# Patient Record
Sex: Female | Born: 1935
Health system: Southern US, Community
[De-identification: ages and names within clinical notes are randomized; demographics above are authoritative.]

## PROBLEM LIST (undated history)

## (undated) DIAGNOSIS — C4402 Squamous cell carcinoma of skin of lip: Secondary | ICD-10-CM

## (undated) DIAGNOSIS — M1990 Unspecified osteoarthritis, unspecified site: Secondary | ICD-10-CM

## (undated) DIAGNOSIS — J189 Pneumonia, unspecified organism: Secondary | ICD-10-CM

## (undated) DIAGNOSIS — E785 Hyperlipidemia, unspecified: Secondary | ICD-10-CM

## (undated) DIAGNOSIS — D649 Anemia, unspecified: Secondary | ICD-10-CM

## (undated) DIAGNOSIS — Z9289 Personal history of other medical treatment: Secondary | ICD-10-CM

## (undated) DIAGNOSIS — G459 Transient cerebral ischemic attack, unspecified: Secondary | ICD-10-CM

## (undated) DIAGNOSIS — R112 Nausea with vomiting, unspecified: Secondary | ICD-10-CM

## (undated) DIAGNOSIS — Z9889 Other specified postprocedural states: Secondary | ICD-10-CM

## (undated) DIAGNOSIS — B370 Candidal stomatitis: Secondary | ICD-10-CM

## (undated) DIAGNOSIS — I639 Cerebral infarction, unspecified: Secondary | ICD-10-CM

## (undated) DIAGNOSIS — I10 Essential (primary) hypertension: Secondary | ICD-10-CM

## (undated) HISTORY — PX: BREAST CYST EXCISION: SHX579

## (undated) HISTORY — PX: LAPAROSCOPIC CHOLECYSTECTOMY: SUR755

## (undated) HISTORY — PX: JOINT REPLACEMENT: SHX530

## (undated) HISTORY — PX: MOHS SURGERY: SUR867

## (undated) HISTORY — PX: EYE SURGERY: SHX253

## (undated) HISTORY — PX: TUBAL LIGATION: SHX77

---

## 1968-09-26 HISTORY — PX: APPENDECTOMY: SHX54

## 1982-09-26 HISTORY — PX: VAGINAL HYSTERECTOMY: SUR661

## 1998-10-01 ENCOUNTER — Other Ambulatory Visit: Admission: RE | Admit: 1998-10-01 | Discharge: 1998-10-01 | Payer: Self-pay | Admitting: Obstetrics and Gynecology

## 1999-10-11 ENCOUNTER — Other Ambulatory Visit: Admission: RE | Admit: 1999-10-11 | Discharge: 1999-10-11 | Payer: Self-pay | Admitting: Obstetrics and Gynecology

## 2000-11-17 ENCOUNTER — Other Ambulatory Visit: Admission: RE | Admit: 2000-11-17 | Discharge: 2000-11-17 | Payer: Self-pay | Admitting: Obstetrics and Gynecology

## 2001-11-30 ENCOUNTER — Other Ambulatory Visit: Admission: RE | Admit: 2001-11-30 | Discharge: 2001-11-30 | Payer: Self-pay | Admitting: Obstetrics and Gynecology

## 2002-12-27 ENCOUNTER — Other Ambulatory Visit: Admission: RE | Admit: 2002-12-27 | Discharge: 2002-12-27 | Payer: Self-pay | Admitting: Obstetrics and Gynecology

## 2003-09-27 HISTORY — PX: CATARACT EXTRACTION W/ INTRAOCULAR LENS  IMPLANT, BILATERAL: SHX1307

## 2003-12-29 ENCOUNTER — Other Ambulatory Visit: Admission: RE | Admit: 2003-12-29 | Discharge: 2003-12-29 | Payer: Self-pay | Admitting: Obstetrics and Gynecology

## 2004-09-26 DIAGNOSIS — Z9289 Personal history of other medical treatment: Secondary | ICD-10-CM

## 2004-09-26 HISTORY — PX: TOTAL KNEE ARTHROPLASTY: SHX125

## 2004-09-26 HISTORY — DX: Personal history of other medical treatment: Z92.89

## 2004-09-29 ENCOUNTER — Inpatient Hospital Stay (HOSPITAL_COMMUNITY): Admission: RE | Admit: 2004-09-29 | Discharge: 2004-10-05 | Payer: Self-pay | Admitting: Orthopedic Surgery

## 2004-09-29 ENCOUNTER — Ambulatory Visit: Payer: Self-pay | Admitting: Physical Medicine & Rehabilitation

## 2007-05-04 ENCOUNTER — Inpatient Hospital Stay (HOSPITAL_COMMUNITY): Admission: EM | Admit: 2007-05-04 | Discharge: 2007-05-05 | Payer: Self-pay | Admitting: Emergency Medicine

## 2007-05-04 ENCOUNTER — Encounter (INDEPENDENT_AMBULATORY_CARE_PROVIDER_SITE_OTHER): Payer: Self-pay | Admitting: *Deleted

## 2007-05-07 ENCOUNTER — Ambulatory Visit (HOSPITAL_COMMUNITY): Admission: RE | Admit: 2007-05-07 | Discharge: 2007-05-07 | Payer: Self-pay | Admitting: *Deleted

## 2009-09-26 HISTORY — PX: RETINAL DETACHMENT SURGERY: SHX105

## 2010-09-16 ENCOUNTER — Observation Stay (HOSPITAL_COMMUNITY)
Admission: RE | Admit: 2010-09-16 | Discharge: 2010-09-17 | Payer: Self-pay | Source: Home / Self Care | Attending: Ophthalmology | Admitting: Ophthalmology

## 2010-12-06 LAB — BASIC METABOLIC PANEL
BUN: 12 mg/dL (ref 6–23)
CO2: 30 mEq/L (ref 19–32)
Calcium: 9.9 mg/dL (ref 8.4–10.5)
Chloride: 97 mEq/L (ref 96–112)
Creatinine, Ser: 0.93 mg/dL (ref 0.4–1.2)
GFR calc Af Amer: >60 mL/min (ref >60)
GFR calc non Af Amer: 58 mL/min — ABNORMAL LOW (ref >60)
Glucose, Bld: 118 mg/dL — ABNORMAL HIGH (ref 70–99)
Potassium: 3.4 mEq/L — ABNORMAL LOW (ref 3.5–5.1)
Sodium: 138 mEq/L (ref 135–145)

## 2010-12-06 LAB — CBC
HCT: 39.3 % (ref 36.0–46.0)
Hemoglobin: 13.5 g/dL (ref 12.0–15.0)
MCH: 31.8 pg (ref 26.0–34.0)
MCHC: 34.4 g/dL (ref 30.0–36.0)
MCV: 92.7 fL (ref 78.0–100.0)
Platelets: 361 10*3/uL (ref 150–400)
RBC: 4.24 MIL/uL (ref 3.87–5.11)
RDW: 13.2 % (ref 11.5–15.5)
WBC: 8.4 10*3/uL (ref 4.0–10.5)

## 2010-12-06 LAB — SURGICAL PCR SCREEN
MRSA, PCR: NEGATIVE
Staphylococcus aureus: POSITIVE — AB

## 2011-02-08 NOTE — H&P (Signed)
[name], [name]            ACCOUNT NO.:  [id number]   MEDICAL RECORD NO.:  [id number]          PATIENT TYPE:  EMS   LOCATION:  MAJO                         FACILITY:  [facility]   PHYSICIAN:  Bevelyn Buckles. Champey, M.D.DATE OF BIRTH:  13-Oct-1930   DATE OF ADMISSION:  05/04/2007  DATE OF DISCHARGE:                              HISTORY & PHYSICAL   STROKE ADMIT NOTE:   REQUESTING PHYSICIAN:  Dr. Jennette Kettle.   REASON FOR ADMISSION:  Code stroke.   HISTORY OF PRESENT ILLNESS:  Ms. [name] is a 77 year old Caucasian  female with a past medical history of hypertension who presents with  onset, around 8:30 this morning, of intense vertigo and right upper  extremity tingling.  This was followed by right upper extremity being  weak.  Symptoms have resolved or have greatly improved; however, patient  has only subtle right hand tingling currently.  She denies any  headaches, vision changes, speech changes, swallowing problems, chewing  problems, falls or loss of consciousness.  Patient does state that she  felt extremely unsteady on her feet when she had significant vertigo.   PAST MEDICAL HISTORY:  Positive for:  1. Right total knee replacement.  2. Hypertension.   CURRENT MEDICATIONS:  Include hydrochlorothiazide and Premarin.   ALLERGIES:  NO KNOWN DRUG ALLERGIES.   FAMILY HISTORY:  Positive for diabetes.   SOCIAL HISTORY:  The patient lives alone.  Denies any alcohol, smoking  or drug use.   REVIEW OF SYSTEMS:  Positive as per HPI.  Negative as per HPI in greater  than 8 other organ systems.   PHYSICAL EXAMINATION:  VITAL SIGNS:  Temperature is 97.0.  Blood  pressure is 164/78.  Heart rate is 68.  Respirations are 29.  O2  saturation is 100%.  HEENT:  Normocephalic, atraumatic.  Extraocular muscles are intact.  Pupils equal, round and reactive to light.  NECK:  Supple.  No carotid bruits.  HEART:  Regular.  LUNGS:  Clear.  ABDOMEN:  Soft, nontender.  EXTREMITIES:  Show good  pulses.  NEUROLOGICAL EXAMINATION:  Patient is awake, alert and oriented x3.  Language is fluent.  Cranial nerves II-XII are grossly intact.  Motor  examination:  Patient has good strength in all 4 extremities.  The  patient might have subtle right grip strength weakness at 4-4+/5  strength.  Sensory examination:  Within normal limits to light touch and  pinprick.  Reflexes are 1-2+ throughout and symmetric.  Toes are neutral  bilaterally.  Cerebellar function is within normal limits finger-to-nose  and heel-to-shin.  Gait was not assessed secondary to safety.  NIAH  Stroke Scale was 0.   CT of head show no acute bleed or infarct.   LABORATORY DATA:  Currently pending.   IMPRESSION:  This is a 77 year old Caucasian female with transient right-  sided tingling, weakness and vertigo, which all symptoms have resolved  or seem to improve and NIH Stroke Scale was 0.  Patient is not a  candidate for IV TPA given that her symptoms have resolved and symptoms  were greater than 3 hour onset.  I will admit the patient  to stroke MD  service and diagnosis of transient ischemic attack versus a small  infarct.  We will start on an aspirin a day and hold her Premarin.  We  will check an MRI/MRA of the brain, a 2D echocardiogram with carotid  Dopplers.  We will check fasting and homocystine level and lipids.  We  will put the patient on deep venous thrombosis prophylaxis.  We will  follow the patient while she is in the hospital.      Bevelyn Buckles. Nash Shearer, M.D.  Electronically Signed     DRC/MEDQ  D:  05/04/2007  T:  05/05/2007  Job:  [id number]

## 2011-02-08 NOTE — Discharge Summary (Signed)
[name], [name]            ACCOUNT NO.:  [id number]   MEDICAL RECORD NO.:  [id number]          PATIENT TYPE:  INP   LOCATION:  3031                         FACILITY:  [facility]   PHYSICIAN:  Bevelyn Buckles. Champey, M.D.DATE OF BIRTH:  [birth year]   DATE OF ADMISSION:  05/04/2007  DATE OF DISCHARGE:  05/05/2007                               DISCHARGE SUMMARY   REASON FOR ADMISSION:  TIA.   SECONDARY DIAGNOSES:  1. Hypertension.  2. Right total knee replacement.   DISCHARGE MEDICATIONS:  1. Aspirin 81 mg p.o. daily.  2. Hydrochlorothiazide 25 mg p.o. daily.  3. Zocor 20 mg p.o. daily.   HOSPITAL COURSE:  Please see admission H&P for complete details of  admission. The patient was admitted with transient right-sided tingling,  numbness and vertigo which completely resolved.  Her initial  stroke  scale was zero. The patient had an MRI scan of the brain that did not  have any evidence of acute stroke, showed some mild small vessel disease  and mild arthrosclerosis. Carotid duplex bilaterally showed no ICA  stenosis.  The patient's cholesterol level was elevated at 243, and LDL  was 127. Hemoglobin A1c was 5.8.  Homocysteine level was pending.  Urinalysis showed 3-6 wbc's and positive nitrites. The patient's  symptoms completely resolved.  She had uneventful hospital stay. She was  started on Zocor day of admission for elevated cholesterol.  We held her  Premarin secondary to her TIA.  We arranged a 2-D echocardiogram as an  outpatient on Monday morning. The patient was discharged home in stable  condition.  She is told to follow up with Dr. Nash Shearer in two week's  time. We will also place the patient on Levaquin 500 mg per day for 5  days for a mild UTI.  The patient is told to call with any questions or  concerns.      Bevelyn Buckles. Nash Shearer, M.D.  Electronically Signed     DRC/MEDQ  D:  05/05/2007  T:  05/06/2007  Job:  [id number]

## 2011-02-11 NOTE — Op Note (Signed)
NAMERUSSELL, ENGELSTAD            ACCOUNT NO.:  [id number]   MEDICAL RECORD NO.:  [id number]          PATIENT TYPE:  INP   LOCATION:  X003                         FACILITY:  [facility]   PHYSICIAN:  Ollen Gross, M.D.    DATE OF BIRTH:  [birth year]   DATE OF PROCEDURE:  09/29/2004  DATE OF DISCHARGE:                                 OPERATIVE REPORT   PREOPERATIVE DIAGNOSIS:  Osteoarthritis of right knee with valgus deformity.   POSTOPERATIVE DIAGNOSIS:  Osteoarthritis of right knee with valgus  deformity.   OPERATION PERFORMED:  Right total knee arthroplasty.   SURGEON:  Ollen Gross, M.D.   ASSISTANT:  Alexzandrew L. Julien Girt, P.A.   ANESTHESIA:  General with postoperative Marcaine pain pump.   ESTIMATED BLOOD LOSS:  Minimal.   DRAINS:  Hemovac times one.   TOURNIQUET TIME:  37 minutes at 300 mmHg.   COMPLICATIONS:  None.   CONDITION:  Stable to recovery room.   INDICATIONS FOR PROCEDURE:  Ms. Yazdi is a 77 year old female with  severe end stage arthritis of the right knee with significant valgus  deformity.  She has failed nonoperative management and presents now for  right total knee arthroplasty.   DESCRIPTION OF PROCEDURE:  After successful administration of general  anesthetic, a tourniquet was placed high on the right thigh and right lower  extremity prepped and draped in the usual sterile fashion.  Extremity was  wrapped with an Esmarch bandage, knee flexed and tourniquet inflated to 300  mmHg.  A standard midline incision was made with a 10 blade through  subcutaneous tissue to the level of the extensor mechanism.  Given her  severe valgus deformity, we did a lateral parapatellar arthrotomy.  The  arthrotomy was made and the soft tissue over the proximal lateral tibia was  subperiosteally elevated to the joint line with a knife.  We then everted  the patella medially.  The ACL and PCL were removed.  The knee was flexed 90  degrees and the drill was used to  create a starting hole in the distal  femur.  The canal was irrigated and the 5 degree right valgus and alignment  guide was placed.  Referencing the posterior condyles rotation is marked and  the block pinned to remove 10 mm off the distal femur.  Distal femoral  resection was made with an oscillating saw.  Sizing block was placed and  size 3 was most appropriate.  We did our rotation off the epicondylar axis.  Size 3 cutting block was placed and then anterior,posterior and chamfer cuts  were made for the size 3.   Tibia was subluxed forward and the menisci removed.  Extramedullary tibial  alignment guide was placed referencing proximally at the medial aspect of  the tibial tubercle and distally along the second metatarsal axis and the  tibial crest.  Blocks, pins removed approximately 6 mm off the less  deficient medial side.  The tibial resection was made with an oscillating  saw.  Size 2.5 was most appropriate.  The proximal tibia was then prepared  with a modular drill and keel punch for  size 2.5.  Femoral preparation was  completed with intercondylar cut.   The trial 3, size 3 posterior stabilized femur, size 2.5 mobile bearing  tibial tray and a 10 mm trial posterior stabilized rotating platform insert  trial was placed.  Full extension achieved with excellent varus and valgus  balance throughout full range of motion.  Her valgus deformity was corrected  back to neutral.  The patella then everted medially and thickness measured  to be 22 mm.  Free hand resection was taken to 13 mm, a 35 template was  placed, lug holes were drilled, trial patella was placed, and it tracked  normally.  Osteophytes are removed off the posterior femur with the femoral  in place.  All trials were removed and the cut bone surfaces were prepared  with pulsatile lavage.  Cement was mixed and once ready for implantation,  the size 2.5 mobile bearing tibial tray, size 3 posterior stabilized femur  and 35  patella were cemented into place.  Patella was held with a clamp.  Trial 10 mm inserts placed.  Knee held in full extension.  All extruded  cement removed.  Once the cement was fully hardened then the permanent 10 mm  posterior stabilized rotating platform is placed in the tibial tray.  The  wound was then copiously irrigated with saline solution and the tourniquet  released with a total time of 37 minutes.  Minor bleeding stopped with  cautery.  We then closed the arthrotomy laterally with #1 PDS leaving open  the area from the superior to the inferior pole of the patella to serve as a  minilateral release.  Patella tracked normally with flexion against gravity  of 140 degrees.  Subcu was then closed with interrupted 2-0 Vicryl and  subcuticular running 4-0 Monocryl.  The incision was cleaned and dried and  Steri-Strips were applied.  We hooked the drain to suction and placed the  catheter for the Marcaine pain pump and initiated the pump.  A bulky sterile  dressing was placed about her knee. We then had urology come in to place the  Foley catheter as she had unusual anatomy.  Once that was placed, she was  then awakened and transported to recovery in stable condition.     Drenda Freeze   FA/MEDQ  D:  09/29/2004  T:  09/29/2004  Job:  [id number]

## 2011-02-11 NOTE — H&P (Signed)
[name], [name]            ACCOUNT NO.:  [id number]   MEDICAL RECORD NO.:  [id number]          PATIENT TYPE:  INP   LOCATION:  NA                           FACILITY:  [facility]   PHYSICIAN:  Ollen Gross, M.D.    DATE OF BIRTH:  [birth year]   DATE OF ADMISSION:  09/29/2004  DATE OF DISCHARGE:                                HISTORY & PHYSICAL   DATE OF OFFICE VISIT HISTORY AND PHYSICAL:  September 23, 2004   CHIEF COMPLAINT:  Right knee pain.   HISTORY OF PRESENT ILLNESS:  The patient is a 77 year old female with a  several year history of progressive worsening right knee pain.  No specific  injury leading up to the knee pain; however, it is becoming functionally  limiting.  She is a very avid sports fan, especially [employer] football.  She has had a difficult time getting to the games and going up and down the  steps.  She has tried medications in the past but continues to have  progressive pain.  She is seen in the office by Dr. Lequita Halt, and x-rays show  bone-on-bone changes in the lateral patellofemoral compartments, end-stage  valgus arthritis.  She has a greater than 20-degree deformity noted.  It is  felt she does have significant changes and felt to be a good candidate for  knee replacement.  Risks and benefits of the procedure of the knee  replacement have been discussed, and she elects to proceed with surgery.   ALLERGIES:  No known drug allergies.   CURRENT MEDICATIONS:  1.  Premarin 0.625 mg.  2.  Hydrochlorothiazide 25 mg.  3.  Glucosamine sulfate 2000 mg daily.   PAST MEDICAL HISTORY:  Hypertension.   PAST SURGICAL HISTORY:  1.  Hysterectomy.  2.  Gallbladder surgery.   SOCIAL HISTORY:  Widowed.  Retired.  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  Nonsmoker, no alcohol, has three children.  She does not have any assist  with her care after surgery.   FAMILY HISTORY:  Mother with a history of MI.  Father with a history of  liver cancer.  There were eight  children.  She has seven other siblings,  five of whom are still living of her siblings.  She does have two brothers,  both with lung cancer.  She had another brother with arthritis.  She has two  sisters who are very healthy.  She has another brother who is deceased  secondary to heart disease.   REVIEW OF SYSTEMS:  GENERAL:  No fevers, chills, or night sweats.  NEUROLOGIC:  No seizures, syncope, paralysis.  RESPIRATORY:  No shortness of  breath, productive cough, or hemoptysis.  CARDIOVASCULAR:  No chest pain,  angina, or orthopnea.  GI:  No nausea, vomiting, diarrhea, or constipation.  GU:  No dysuria, hematuria, or discharge.  MUSCULOSKELETAL:  Right knee  found in the history of present illness.   PHYSICAL EXAMINATION:  VITAL SIGNS:  Pulse 108, respirations 12, blood  pressure 158/84.  GENERAL:  A 77 year old white female, short, petite frame.  No acute  distress.  She is  alert, oriented, cooperative, and very pleasant at time of  exam.  HEENT:  Normocephalic, atraumatic.  Pupils are round and reactive.  She is  noted to wear glasses.  EOMs are intact.  Oropharynx is clear.  She does  have full upper and lower dentures noted.  CHEST:  Clear anterior and posterior chest walls.  No rhonchi, rales, or  wheezing.  HEART:  Regular rhythm, somewhat tachy 104-108.  Otherwise weakness.  No  rubs, thrills, palpitations.  ABDOMEN:  Soft, nontender.  Bowel sounds present.  GU/RECTAL/BREASTS/GENITALIA:  Not done.  Not pertinent to present illness.  EXTREMITIES:  Significant to the right knee.  She does have a significant  valgus malalignment with about a 20-degree deformity.  Range of motion of 0-  120 degrees.  She has marked crepitus noted on passive range of motion.  No  instability.  No effusion.   IMPRESSION:  1.  Osteoarthritis, right knee.  2.  Hypertension.   PLAN:  The patient will be admitted to [facility] to undergo a  right total knee arthroplasty.  Surgery will be  performed by Dr. Ollen Gross.     Alex   ALP/MEDQ  D:  09/28/2004  T:  09/28/2004  Job:  [id number]   cc:   C. Duane Lope, M.D.  [address]  [place]  [place] [address]  Fax: [phone]

## 2011-02-11 NOTE — Discharge Summary (Signed)
[name], Adams            ACCOUNT NO.:  [id number]   MEDICAL RECORD NO.:  [id number]          PATIENT TYPE:  INP   LOCATION:  0483                         FACILITY:  [facility]   PHYSICIAN:  Ollen Gross, M.D.    DATE OF BIRTH:  [birth year]   DATE OF ADMISSION:  09/29/2004  DATE OF DISCHARGE:  10/05/2004                                 DISCHARGE SUMMARY   ADMISSION DIAGNOSES:  1.  Osteoarthritis, right knee.  2.  Hypertension.   DISCHARGE DIAGNOSES:  1.  Osteoarthritis, right knee, with valgus deformity, status post right      total knee arthroplasty.  2.  Hypertension.  3.  Postoperative blood loss anemia.  4.  Status post transfusion without sequelae.  5.  Postoperative hyponatremia, improved.  6.  Postoperative hypokalemia, improved.   PROCEDURE:  On September 29, 2004, right total knee arthroplasty.   SURGEON:  Ollen Gross, M.D.   ASSISTANT:  Alexzandrew L. Perkins, P.A.-C.   ANESTHESIA:  General with postoperative Marcaine pain pump.   DRAINS:  Hemovac x1.   TOURNIQUET TIME:  Thirty-seven minutes at 300 mmHg.   CONSULTS:  GU secondary to severe labial adhesions and difficult Foley  insertion.   BRIEF HISTORY:  [name] is a 77 year old female with severe end-stage  arthritis of the right knee with severe, significant valgus deformity.  She  has failed nonoperative managements and now presents for total knee  arthroplasty.   LABORATORY DATA:  Preop hemoglobin 12.7, hematocrit 37.8.  Differential on  the admission CBC within normal limits.  Postop hemoglobin 9.3, drifted down  to 7.8.  She was given blood.  Post-transfusion hemoglobin and hematocrit 12  and 34.3.  PT/PTT preop 12 and 27, respectively.  Serial pro times followed:  Last noted PT/INR 20.2 and 2.1.  Chem panel on admission:  Slightly low  albumin of 3.4.  Admitting chem panel all within normal limits.  Serial  BMETs are followed.  Sodium did drop from 137 down to 123, back up to 135.  Potassium drifted from 4.1 to 2.6, back up to 4.  Chloride dropped from 98  down to 86, back up to 97.  Glucose went up from 89 to 135, back down to 96.  Preop UA:  Trace hemoglobin, moderate leukocyte esterase, 7-10 white cells,  0-2 red cells, many bacteria.  Blood group type A+.   EKG dated September 24, 2004:  Normal sinus rhythm with local QRS.  No  previous tracing.  Nonspecific T wave abnormalities, confirmed by Dr. Olga Millers.   HOSPITAL COURSE:  Patient was admitted to [facility] , taken to  the OR, and underwent the above procedure.  Had difficulty following  insertion, requiring genitourinary consult.  Found to have severe labial  adhesions obstructing vaginal opening.  Foley was finally inserted.  Estraderm cream was ordered for vaginal area for 2-4 weeks for decreasing  adhesions.  Later underwent the procedure.  Tolerated the procedure well.  Later was transferred to the recovery room and then to the orthopedic floor.  Patient was placed on PCA and p.o. analgesics.   On day #  1, she was actually doing very well, had good control with PCA and  p.o. meds.  She did have some positive fluid volume.  She was normally on  hydrochlorothiazide.  Monitored the I's and O's closely.  She did have a  little bit of nausea on the evening of postop day #1.  This was much better.   On day #2, her hemoglobin had dropped down to 7.8.  Was rechecked and  confirmed at 7.8.  Due to the significantly low hemoglobin postoperatively,  it was felt that she would best be served by undergoing blood.  She did  receive blood transfusion.  Tolerated blood well.  Hemoglobin responded  well.  Foley and PCA were discontinued on day #2.  A rehab consult was  called to assess for the need of possible inpatient rehab.  The patient was  seen by rehab services and they did recommend rehab or SACU stay.  She was  also noted to have a drop in her sodium and potassium, and this was felt to  be due  to dilutional component from the IV fluids and the surgery.  This did  improve with potassium supplementation and also normal diuresis of her  fluids.   By day #4, she was doing much better.  Pain was under good control.  She  started walking up and down the hallway much easier with physical therapy.  Her sodium and potassium low levels had resolved.   By October 04, 2004, she continued to progress well.  The incision was  healing well.  It was noted that she lived alone.  Still recommended that  she go to rehab; however, rehab coordinators, SACU coordinators, felt that  the patient was moderately independent.  She was walking with a cane, and  she was up to ambulating 200 feet.  Felt to be too high level for an  inpatient stay.  The patient was advised of this.  She did understand;  therefore, she remained in the hospital.  Continued with her physical  therapy.  Once she was doing quite well and progressing with her activities,  she was discharged home the following day of October 05, 2004.   DISCHARGE PLAN:  1.  Patient was discharged home on October 05, 2004.  2.  Discharge diagnoses:  Please see above.  3.  Discharge meds:  Percocet, Robaxin, Coumadin.  Resume      hydrochlorothiazide.  Hold her Premarin.  4.  Diet as tolerated.  5.  Activity:  Home health PT/OT and home health nursing.  Weightbearing as      tolerated.  Total knee protocol.  6.  Follow up in two weeks from surgery.  Contact the office for an      appointment.   DISPOSITION:  Home.   CONDITION ON DISCHARGE:  Improved.      ALP/MEDQ  D:  11/17/2004  T:  11/18/2004  Job:  [id number]   cc:   C. Duane Lope, M.D.  [address]  [place]  [place] [address]  Fax: [phone]

## 2011-03-19 ENCOUNTER — Other Ambulatory Visit (HOSPITAL_COMMUNITY): Payer: Self-pay

## 2011-03-19 ENCOUNTER — Emergency Department (HOSPITAL_COMMUNITY): Payer: Medicare Other

## 2011-03-19 ENCOUNTER — Inpatient Hospital Stay (HOSPITAL_COMMUNITY)
Admission: EM | Admit: 2011-03-19 | Discharge: 2011-03-22 | DRG: 690 | Disposition: A | Discharge status: Home / Self Care | Payer: Medicare Other | Attending: Internal Medicine | Admitting: Internal Medicine

## 2011-03-19 DIAGNOSIS — R55 Syncope and collapse: Secondary | ICD-10-CM | POA: Diagnosis present

## 2011-03-19 DIAGNOSIS — N390 Urinary tract infection, site not specified: Principal | ICD-10-CM | POA: Diagnosis present

## 2011-03-19 DIAGNOSIS — M4802 Spinal stenosis, cervical region: Secondary | ICD-10-CM | POA: Diagnosis present

## 2011-03-19 DIAGNOSIS — B952 Enterococcus as the cause of diseases classified elsewhere: Secondary | ICD-10-CM | POA: Diagnosis present

## 2011-03-19 DIAGNOSIS — M1990 Unspecified osteoarthritis, unspecified site: Secondary | ICD-10-CM | POA: Diagnosis present

## 2011-03-19 DIAGNOSIS — E119 Type 2 diabetes mellitus without complications: Secondary | ICD-10-CM | POA: Diagnosis present

## 2011-03-19 DIAGNOSIS — I6529 Occlusion and stenosis of unspecified carotid artery: Secondary | ICD-10-CM | POA: Diagnosis present

## 2011-03-19 DIAGNOSIS — I10 Essential (primary) hypertension: Secondary | ICD-10-CM | POA: Diagnosis present

## 2011-03-19 DIAGNOSIS — E876 Hypokalemia: Secondary | ICD-10-CM | POA: Diagnosis present

## 2011-03-19 DIAGNOSIS — Z7982 Long term (current) use of aspirin: Secondary | ICD-10-CM

## 2011-03-19 DIAGNOSIS — E785 Hyperlipidemia, unspecified: Secondary | ICD-10-CM | POA: Diagnosis present

## 2011-03-19 DIAGNOSIS — Z8673 Personal history of transient ischemic attack (TIA), and cerebral infarction without residual deficits: Secondary | ICD-10-CM

## 2011-03-19 LAB — BASIC METABOLIC PANEL
BUN: 18 mg/dL (ref 6–23)
CO2: 28 mEq/L (ref 19–32)
Calcium: 9.4 mg/dL (ref 8.4–10.5)
Creatinine, Ser: 0.87 mg/dL (ref 0.50–1.10)
GFR calc Af Amer: >60 mL/min (ref >60)
GFR calc non Af Amer: >60 mL/min (ref >60)
Glucose, Bld: 111 mg/dL — ABNORMAL HIGH (ref 70–99)
Potassium: 3.2 mEq/L — ABNORMAL LOW (ref 3.5–5.1)
Sodium: 136 mEq/L (ref 135–145)

## 2011-03-19 LAB — CBC
HCT: 34.4 % — ABNORMAL LOW (ref 36.0–46.0)
Hemoglobin: 11.9 g/dL — ABNORMAL LOW (ref 12.0–15.0)
MCH: 32 pg (ref 26.0–34.0)
MCHC: 34.6 g/dL (ref 30.0–36.0)
Platelets: 353 10*3/uL (ref 150–400)
RDW: 13.1 % (ref 11.5–15.5)
WBC: 8.2 10*3/uL (ref 4.0–10.5)

## 2011-03-19 LAB — DIFFERENTIAL
Basophils Absolute: 0.1 10*3/uL (ref 0.0–0.1)
Basophils Relative: 1 % (ref 0–1)
Eosinophils Absolute: 0.2 10*3/uL (ref 0.0–0.7)
Eosinophils Relative: 2 % (ref 0–5)
Lymphocytes Relative: 23 % (ref 12–46)
Monocytes Absolute: 0.6 10*3/uL (ref 0.1–1.0)
Monocytes Relative: 7 % (ref 3–12)
Neutrophils Relative %: 68 % (ref 43–77)

## 2011-03-19 LAB — URINALYSIS, ROUTINE W REFLEX MICROSCOPIC
Bilirubin Urine: NEGATIVE
Glucose, UA: NEGATIVE mg/dL
Hgb urine dipstick: NEGATIVE
Nitrite: NEGATIVE
Protein, ur: NEGATIVE mg/dL

## 2011-03-19 LAB — URINE MICROSCOPIC-ADD ON

## 2011-03-19 LAB — CK TOTAL AND CKMB (NOT AT ARMC)
CK, MB: 3.1 ng/mL (ref 0.3–4.0)
Relative Index: 1.9 (ref 0.0–2.5)
Total CK: 162 U/L (ref 7–177)

## 2011-03-19 LAB — PROTIME-INR
INR: 0.94 (ref 0.00–1.49)
Prothrombin Time: 12.8 seconds (ref 11.6–15.2)

## 2011-03-19 LAB — CARDIAC PANEL(CRET KIN+CKTOT+MB+TROPI): Relative Index: 1.9 (ref 0.0–2.5)

## 2011-03-19 LAB — APTT: aPTT: 30 seconds (ref 24–37)

## 2011-03-19 LAB — HEMOGLOBIN A1C: Mean Plasma Glucose: 128 mg/dL — ABNORMAL HIGH (ref <117)

## 2011-03-19 MED ORDER — IOHEXOL 300 MG/ML  SOLN: 80.0000 mL | Freq: Once | INTRAMUSCULAR | Status: AC | PRN

## 2011-03-20 ENCOUNTER — Inpatient Hospital Stay (HOSPITAL_COMMUNITY): Payer: Medicare Other

## 2011-03-20 DIAGNOSIS — R55 Syncope and collapse: Secondary | ICD-10-CM

## 2011-03-20 LAB — CBC
MCV: 91.4 fL (ref 78.0–100.0)
Platelets: 334 10*3/uL (ref 150–400)
RBC: 3.74 MIL/uL — ABNORMAL LOW (ref 3.87–5.11)
WBC: 8.9 10*3/uL (ref 4.0–10.5)

## 2011-03-20 LAB — TSH: TSH: 3.258 u[IU]/mL (ref 0.350–4.500)

## 2011-03-20 LAB — CARDIAC PANEL(CRET KIN+CKTOT+MB+TROPI)
CK, MB: 2.8 ng/mL (ref 0.3–4.0)
Relative Index: 2.2 (ref 0.0–2.5)
Troponin I: <0.3 ng/mL (ref <0.30)

## 2011-03-20 LAB — LIPID PANEL
HDL: 59 mg/dL (ref >39)
LDL Cholesterol: 109 mg/dL — ABNORMAL HIGH (ref 0–99)
Total CHOL/HDL Ratio: 3.4 RATIO
Triglycerides: 148 mg/dL (ref <150)
VLDL: 30 mg/dL (ref 0–40)

## 2011-03-20 LAB — COMPREHENSIVE METABOLIC PANEL
AST: 29 U/L (ref 0–37)
Albumin: 3.6 g/dL (ref 3.5–5.2)
Calcium: 9.3 mg/dL (ref 8.4–10.5)
Chloride: 99 mEq/L (ref 96–112)
Creatinine, Ser: 0.76 mg/dL (ref 0.50–1.10)
Total Bilirubin: 0.3 mg/dL (ref 0.3–1.2)
Total Protein: 6.8 g/dL (ref 6.0–8.3)

## 2011-03-20 LAB — VITAMIN B12: Vitamin B-12: 693 pg/mL (ref 211–911)

## 2011-03-21 LAB — TSH: TSH: 5.852 u[IU]/mL — ABNORMAL HIGH (ref 0.350–4.500)

## 2011-03-21 LAB — MAGNESIUM: Magnesium: 2 mg/dL (ref 1.5–2.5)

## 2011-03-21 LAB — CBC
MCH: 32.6 pg (ref 26.0–34.0)
MCHC: 35.9 g/dL (ref 30.0–36.0)
MCV: 90.7 fL (ref 78.0–100.0)
Platelets: 358 10*3/uL (ref 150–400)

## 2011-03-21 LAB — URINE CULTURE: Colony Count: >=100000

## 2011-03-21 LAB — BASIC METABOLIC PANEL
BUN: 12 mg/dL (ref 6–23)
Calcium: 9 mg/dL (ref 8.4–10.5)
Creatinine, Ser: 0.62 mg/dL (ref 0.50–1.10)
GFR calc Af Amer: >60 mL/min (ref >60)

## 2011-03-24 LAB — VITAMIN D 1,25 DIHYDROXY
Vitamin D2 1, 25 (OH)2: <8 pg/mL
Vitamin D3 1, 25 (OH)2: 36 pg/mL

## 2011-03-31 NOTE — H&P (Signed)
[name], [name]            ACCOUNT NO.:  [id number]  MEDICAL RECORD NO.:  [id number]  LOCATION:  [facility]                         FACILITY:  [facility]  PHYSICIAN:  Clydia Llano, MD       DATE OF BIRTH:  1931/09/10  DATE OF ADMISSION:  03/19/2011 DATE OF DISCHARGE:                             HISTORY & PHYSICAL   PRIMARY CARE PHYSICIAN:  Dr. Tenny Craw.  REASON FOR ADMISSION:  Syncope.  BRIEF HISTORY AND EXAMINATION:  Ms. Treloar is a 76 year old Caucasian female with past medical history of hypertension, diabetes, previous TIA.  The patient brought to the hospital after she had syncope.  The patient was in her usual state of health until about 8:30 this morning when she was on the car with her daughter and on the way to [place].  The patient felt right-sided chest pain and some tingling in her right arm.  The patient was describing in the pain is 7/10, sharp, does not radiate and she did not notice anything improve or decrease the pain.  Soon afterwards, she told her daughter to pull the car aside cause she is feeling unwell.  Her daughter mentioned she lost her consciousness and she has to call EMS.  For short period of time, there were able to communicate with her again after they were calling her name.  The patient brought to the hospital for further evaluation.  She denies any fever or chills.  Denies nausea, vomiting, shortness of breath.  Denies any palpitations, wheezing or abdominal pain.  Denies any headache or neurological weaknesses while she was in the hospital. Upon initial evaluation in the emergency department, she was found to have high D-dimers, but negative CT scan for PE.  Also has found UA might be consistent with UTI as well as weakness in her right hand grip.  PAST MEDICAL HISTORY: 1. Hypertension. 2. Hyperlipidemia. 3. Transient ischemic attack. 4. Arthritis.  SOCIAL HISTORY:  The patient does not smoke, does not drink, no illicit drug use.  The  patient is still functional.  ALLERGIES:  NKDA.  MEDICATION: 1. Aspirin 81 mg p.o. daily. 2. Tylenol over-the-counter 2 tablets. 3. Acetaminophen Arthritis 500 mg 2 tablets every 8 hours as needed     for arthritis pain. 4. Multivitamin therapeutic 1 tablet p.o. daily. 5. Fish oil OTC 1 cap p.o. daily. 6. Citracal calcium OTC p.o. daily. 7. Hydrochlorothiazide 25 mg p.o. daily. 8. Zocor 20 mg p.o. nightly.  FAMILY HISTORY:  Her mother had diabetes late in her life.  Brother also has diabetes.  No other illnesses run in the family.  REVIEW OF SYSTEMS:  Twelve-point review of systems negative except for the symptoms mentioned in the HPI.  PHYSICAL EXAMINATION:  VITAL SIGNS:  Temperature is 98.0, respiration is 13, blood pressure is 150/64, later it was 107/71. GENERAL:  The patient is a well-developed, well-nourished Caucasian female, wearing hospital gown, lays on her back in no acute distress. HEENT:  Normocephalic and atraumatic.  Eyes:  Pupils equal, reactive to light and accommodation.  Mouth without oral thrush or lesions. NECK:  Supple.  No masses.  No meningeal signs. CARDIOVASCULAR:  Regular rate and rhythm.  No murmurs, rubs, or gallops. RESPIRATORY:  Clear to auscultation bilaterally. CHEST:  Nontender.  Moves to respiration symmetrically. ABDOMEN:  Bowel sounds heard.  Soft, nontender, and distended. EXTREMITIES:  Normal. NEUROVASCULAR:  Intact.  Pulses are normal.  No edema.  Alert, awake, oriented x3.  Gait normal.  Normal coordination.  Normal speech.  No nystagmus.  No facial droop.  A 4/5 strength in the grip of the right hand but the arm and the forearm is 5/5.  No other sensory or motor deficit.  RADIOLOGY:  CT scan of the chest showed negative for PE.  No acute findings in the chest.  CT head was without contrast no acute intracranial abnormalities.  There is left sphenoid and posterior right ethmoid air cell sinus disease.  LABORATORY DATA: 1. CBC, WBC  is 8.2, hemoglobin 11.9, hematocrit 34.4, platelets 353. 2. BMP, sodium 136, potassium 3.2, chloride is 96, bicarb is 28,     glucose 111, BUN is 18, creatinine is 0.8.  Cardiac enzymes, CK-MB     3.1, CK is 162.  Troponin is less than 0.3. 3. Urinalysis, moderate leukocyte esterase, 7-10 pus cells, and many     bacteria.  Miscellaneous, D-dimer 0.9, PTT is 30, INR is 0.9.  ASSESSMENT AND PLAN: 1. Syncope.  The patient will be admitted for further evaluation, it     is clear what the exact etiology for now.  The patient did have     right-sided chest pain, it might be related to that.  CT scan of     the chest was negative even with high D-dimers.  The patient has     UTI that might sometimes can cause syncopal episodes with     dehydration.  We will check MRI of the head.  We will check carotid     duplex especially with the patient 4 years ago did have a TIA and     the duplex showed about 50% stenosis in the right distal internal     carotid artery.  I will follow up on the labs.  We will follow up     on the cystitis. 2. Urinary tract infection.  Urinalysis showed UTI.  We will obtain     urine culture susceptibility.  We will place the patient on     Rocephin and adjust the antibiotics according to culture. 3. Hypochromic mild with potassium of 3.2.  This is likely secondary     to hydrochlorothiazide the patient is taking.  We will replete the     potassium. 4. Dyslipidemia.  The patient is on Zocor.  We will check fasting     lipid profile.     Clydia Llano, MD     ME/MEDQ  D:  03/19/2011  T:  03/19/2011  Job:  [id number]  cc:   Dr. Tenny Craw  Electronically Signed by Clydia Llano  on 03/31/2011 10:29:46 PM

## 2011-04-01 NOTE — Discharge Summary (Signed)
[name], [name]            ACCOUNT NO.:  [id number]  MEDICAL RECORD NO.:  [id number]  LOCATION:  3017                         FACILITY:  [facility]  PHYSICIAN:  [name], MDDATE OF BIRTH:  [birth year]  DATE OF ADMISSION:  03/19/2011 DATE OF DISCHARGE:  03/22/2011                              DISCHARGE SUMMARY   DISCHARGE DIAGNOSES: 1. Syncope secondary to orthostasis and urinary tract infection. 2. Urinary tract infection secondary to enterococcus species. 3. Hypokalemia secondary to the use of diuretics. 4. Hypertension. 5. Hyperlipidemia. 6. Mild left internal carotid artery stenosis with occlusion of 40-     59%. 7. Osteoarthritis. 8. History of transient ischemic attack in the past. 9. Spinal stenosis at C3-C4 with degenerative disk changes on cervical     spine, also hepatic cyst and also left renal cyst. 10.Hyperglycemia, no otherwise specified, with an A1c of 6.1.  DISCHARGE MEDICATIONS: 1. Amoxicillin 500 mg 1 tablet by mouth twice a day for 4 days. 2. Citracal plus vitamin D 1 tablet by mouth twice a day. 3. Acetaminophen 2 tablets by mouth every 8 hours as needed for     arthritic pain. 4. Aspirin 81 mg 1 tablet by mouth every morning. 5. Fish oil over the counter 1 capsule by mouth daily. 6. Hydrochlorothiazide 25 mg 1 tablet by mouth daily. 7. Multivitamins therapeutic over the counter 1 tablet by mouth daily. 8. Zocor 20 mg 1 tablet by mouth daily at bedtime.  DISPOSITION AND FOLLOWUP:  The patient has been discharged in stable improved condition, currently not complaining of any chest pain, shortness of breath, nausea, vomiting, or abdominal pain.  The patient has not had any further episodes of syncope/near syncope throughout this hospitalization and she is no longer orthostatic.  There is also no fever.  The patient had received instructions to arrange a followup with primary care physician, Dr. Tenny Adams over the next 7-10 days.  It is going to  be important during that appointment to repeat a basic metabolic panel to make sure that she is no longer hypokalemic due to the use of diuretics and in case that she is, she then will need to be started on daily potassium supplementation therapy or her hydrochlorothiazide will need to be changed to triamterene.  It will be also important to make sure that the patient has not experienced any further symptoms of syncope/near syncope and that she is taking home medications as prescribed and following heart-healthy diet and low-fat diet.  The patient was found to have mildly elevated hemoglobin A1c with a mild hyperglycemia and that she needs to have further evaluation as an outpatient over the next 3 months.  The patient was advised to follow a low-carbohydrate diet.  PROCEDURE PERFORMED DURING THIS HOSPITALIZATION:  The patient had a chest x-ray done on March 19, 2011, that demonstrated stable exam with no acute cardiopulmonary process.  A CT of the head on admission as well without contrast that demonstrated no acute intracranial abnormality with a left sphenoid and posterior right ethmoid air cell sinus disease. A CT angio of the chest without contrast secondary to an elevated D- dimer that demonstrated negative for pulmonary embolism and no acute findings of the chest.  There was some calcified granulomas and calcified right hilar lymph node consistent with a prior granulomatous disease.  There was several hepatic cysts and also a left renal cyst and there was some atherosclerotic disease in the coronary arteries and thoracic aorta.  The patient had an MRI of the brain on March 20, 2011, that demonstrated normal for age, noncontrast MRI.  There was advanced cervical spine degenerative changes, probably that had progressed since 2008 and there is multifactorial mild spinal stenosis at C3-C4.  The patient also had a 2-D echo done on March 21, 2011, that demonstrated normal left ventricle  cavity size with an ejection fraction of 55% with mild increased thickening of the atrial septum consistent with lipomatous hypertrophy.  There were no abnormalities seen regarding PFO or clots inside the heart.  There was no valve disease appreciated on the 2-D echo either.  No other procedures were performed during this hospitalization.  No consultations were made.  BRIEF HISTORY OF PRESENT ILLNESS:  For full details, refer to Dr. Clydia Adams dictation on March 19, 2011, but [name] is a 77 year old female with a past medical history of hypertension, diabetes, and a previous history of TIA brought to the hospital after she had a syncopal episode.  The patient was in her usual state of health until about 8:30 p.m. on the morning of admission where she was in the car with her daughter on their way to [place], the patient felt right-sided chest pain and some tingling in her right arm.  The patient was describing the pain as 7/10, sharp, without any radiation and she did not notice anything improved or decreased the pain.  Soon afterwards, she told her daughter to pull the car aside because she was not feeling well.  The patient's daughter mentioned that the patient lost her consciousness and she has called EMS.  While in the ED, the patient's symptoms pretty much resolved.  She denies any chills, any fever, nausea, vomiting, shortness of breath, any palpitations, wheezing, or abdominal discomfort.  The patient also denies any headache or neurological weakness while she was in the hospital.  Initial evaluation in the ED found elevated D-dimers, but negative CT scan ruling out pulmonary embolism.  Also, she had urinalysis consistent with a UTI as well as weakness in her right handgrip.  There were no other abnormalities appreciated on admission.  LABORATORY DATA THROUGHOUT THIS HOSPITALIZATION:  The patient had urine culture with more than 100,000 colonies with a culture positive  for enterococcus species, sensitive to ampicillin.  Also had a TSH at 3.258. Had a CBC with differential with a hemoglobin of 11.6, white blood cells 7.8, and platelets 358.  Phosphorus 3.2, magnesium 2.0.  BMET with sodium 136, potassium 3.2, chloride 96, bicarb 28, glucose 111, BUN 18, creatinine 0.87.  D-dimer was elevated at 0.92.  Hemoglobin A1c was 6.1. Lipid profile demonstrated cholesterol of 198, triglyceride 148, HDL 59, and LDL 109.  Cardiac markers were negative x3 throughout this hospitalization.  HOSPITAL COURSE BY PROBLEM: 1. Syncopal episode that appears to be secondary to orthostasis and     also UTI.  She is going to finish treatment for her urinary tract     infection and she is going to follow up with primary care physician     for further evaluation of her symptoms.  Other considerations to     her syncopal episode was vasovagal secondary to pain coming from     the right upper extremity discomfort that  could be due to the     cervical stenosis at C3-C4.  If symptoms recur, the patient will     need to have further evaluation as an outpatient and probably be     seen by neurosurgeon.  She denies any abnormality here during this     admission and because everything else was pretty much normal the     decision was to treat her for her UTI and to provide fluid     resuscitation as she had been feeling great at this point, so this     is just an other suggestion in case that she had further symptoms     down the road. 2. UTI secondary to enterococcus species.  Plan is to use amoxicillin     500 mg by mouth twice a day for 4 more days. 3. Hypokalemia secondary to the use of diuretic most likely.  She had     received potassium supplementation.  Her magnesium was normal.  She     had been advised to keep a high-potassium diet and to follow up     with primary care physician if the potassium is still low on     followup.  She might need supplementation on a daily basis  with     potassium pills. 4. Hypertension.  Plan is to continue using her hydrochlorothiazide.     Blood pressure has been pretty well controlled during this     hospitalization. 5. Hyperlipidemia.  Plan is to continue using statins.  Her     cholesterol panel done during this admission demonstrated a good     control.  The patient is advised to follow a low-fat diet. 6. Mild left ICA stenosis.  At this point, surgery is recommended     neither stents for this degree of stenosis.  We are going to     continue aspirin. 7. Osteoarthritis, well controlled with Tylenol.  No changes are going     to be made to pain medication. 8. History of TIA in the past.  We are going to continue using     aspirin. 9. Findings of her cervical spine.  She is going to continue using     vitamin D and calcium.  We have recommended that she has DEXA scan     done and if the symptoms reappear she might even need to see in a     neurosurgeon and have an MRI of her spine in case that this is the     cause of the discomfort that she was having in her right arm.  At discharge, temperature 98.0, heart rate 64, respiratory rate 20, blood pressure 127/63, and oxygen saturation 95% on room air.  In general, the patient was not having any acute complaints.  There were no orthostatic changes.  There was no fever, no discomfort in her chest. Respiratory system, clear to auscultation bilaterally.  Cardiovascular, regular rate and rhythm.  No murmurs, gallops, or rubs.  Abdomen, soft, nontender, and nondistended with positive bowel sounds.  Extremity without any edema.  There was no numbness or pain on her upper or lower extremities at the moment of discharge.  Neurologic exam, no focal deficit.  Cranial nerves II-XII intact.  DISCHARGE LABORATORY DATA:  The patient had a BMET with sodium of 139, potassium 4.9, chloride 107, bicarb 19, glucose 88, BUN 12, and creatinine 0.62.     Toni Randy,  MD     CEM/MEDQ  D:  03/22/2011  T:  03/23/2011  Job:  [id number]  cc:   Dr. Tenny Adams  Electronically Signed by Vassie Loll MD on 04/01/2011 02:00:03 PM

## 2011-07-11 LAB — CK TOTAL AND CKMB (NOT AT ARMC)
CK, MB: 3.2
Relative Index: 2.7 — ABNORMAL HIGH

## 2011-07-11 LAB — DRUGS OF ABUSE SCREEN W/O ALC, ROUTINE URINE
Amphetamine Screen, Ur: NEGATIVE
Barbiturate Quant, Ur: NEGATIVE
Benzodiazepines.: NEGATIVE
Creatinine,U: 47.5
Marijuana Metabolite: NEGATIVE
Methadone: NEGATIVE
Opiate Screen, Urine: NEGATIVE

## 2011-07-11 LAB — COMPREHENSIVE METABOLIC PANEL
ALT: 18
AST: 27
Albumin: 3.4 — ABNORMAL LOW
Alkaline Phosphatase: 64
BUN: 16
Chloride: 97
Potassium: 3.3 — ABNORMAL LOW
Sodium: 133 — ABNORMAL LOW
Total Bilirubin: 0.5
Total Protein: 7

## 2011-07-11 LAB — URINALYSIS, ROUTINE W REFLEX MICROSCOPIC
Specific Gravity, Urine: 1.01
Urobilinogen, UA: 0.2
pH: 7

## 2011-07-11 LAB — TROPONIN I: Troponin I: 0.01

## 2011-07-11 LAB — DIFFERENTIAL
Lymphocytes Relative: 24
Lymphs Abs: 1.7

## 2011-07-11 LAB — URINE MICROSCOPIC-ADD ON

## 2011-07-11 LAB — CBC
HCT: 35.6 — ABNORMAL LOW
Hemoglobin: 12.2
MCHC: 34.3
RBC: 3.73 — ABNORMAL LOW
RDW: 13.3

## 2011-07-11 LAB — LIPID PANEL
Cholesterol: 243 — ABNORMAL HIGH
Total CHOL/HDL Ratio: 4.3
VLDL: 59 — ABNORMAL HIGH

## 2011-07-11 LAB — ETHANOL: Alcohol, Ethyl (B): <5

## 2011-07-11 LAB — PROTIME-INR
INR: 0.9
Prothrombin Time: 12.7

## 2011-10-05 DIAGNOSIS — Z961 Presence of intraocular lens: Secondary | ICD-10-CM | POA: Diagnosis not present

## 2011-11-21 DIAGNOSIS — Z1231 Encounter for screening mammogram for malignant neoplasm of breast: Secondary | ICD-10-CM | POA: Diagnosis not present

## 2012-01-02 ENCOUNTER — Ambulatory Visit (INDEPENDENT_AMBULATORY_CARE_PROVIDER_SITE_OTHER): Payer: Medicare Other | Admitting: Ophthalmology

## 2012-01-02 DIAGNOSIS — H35379 Puckering of macula, unspecified eye: Secondary | ICD-10-CM

## 2012-01-02 DIAGNOSIS — H33009 Unspecified retinal detachment with retinal break, unspecified eye: Secondary | ICD-10-CM

## 2012-01-02 DIAGNOSIS — H43819 Vitreous degeneration, unspecified eye: Secondary | ICD-10-CM | POA: Diagnosis not present

## 2012-01-11 DIAGNOSIS — L0889 Other specified local infections of the skin and subcutaneous tissue: Secondary | ICD-10-CM | POA: Diagnosis not present

## 2012-01-11 DIAGNOSIS — L821 Other seborrheic keratosis: Secondary | ICD-10-CM | POA: Diagnosis not present

## 2012-01-11 DIAGNOSIS — L570 Actinic keratosis: Secondary | ICD-10-CM | POA: Diagnosis not present

## 2012-03-07 DIAGNOSIS — M169 Osteoarthritis of hip, unspecified: Secondary | ICD-10-CM | POA: Diagnosis not present

## 2012-04-12 ENCOUNTER — Encounter (HOSPITAL_COMMUNITY): Payer: Self-pay | Admitting: Emergency Medicine

## 2012-04-12 ENCOUNTER — Emergency Department (HOSPITAL_COMMUNITY): Payer: Medicare Other

## 2012-04-12 ENCOUNTER — Emergency Department (HOSPITAL_COMMUNITY)
Admission: EM | Admit: 2012-04-12 | Discharge: 2012-04-12 | Disposition: A | Discharge status: Home / Self Care | Payer: Medicare Other | Attending: Emergency Medicine | Admitting: Emergency Medicine

## 2012-04-12 DIAGNOSIS — M47817 Spondylosis without myelopathy or radiculopathy, lumbosacral region: Secondary | ICD-10-CM | POA: Diagnosis not present

## 2012-04-12 DIAGNOSIS — M25559 Pain in unspecified hip: Secondary | ICD-10-CM | POA: Insufficient documentation

## 2012-04-12 DIAGNOSIS — M5126 Other intervertebral disc displacement, lumbar region: Secondary | ICD-10-CM | POA: Diagnosis not present

## 2012-04-12 DIAGNOSIS — M545 Low back pain, unspecified: Secondary | ICD-10-CM | POA: Insufficient documentation

## 2012-04-12 DIAGNOSIS — G8929 Other chronic pain: Secondary | ICD-10-CM | POA: Insufficient documentation

## 2012-04-12 DIAGNOSIS — M48061 Spinal stenosis, lumbar region without neurogenic claudication: Secondary | ICD-10-CM | POA: Diagnosis not present

## 2012-04-12 DIAGNOSIS — R61 Generalized hyperhidrosis: Secondary | ICD-10-CM | POA: Diagnosis not present

## 2012-04-12 DIAGNOSIS — IMO0002 Reserved for concepts with insufficient information to code with codable children: Secondary | ICD-10-CM | POA: Diagnosis not present

## 2012-04-12 DIAGNOSIS — M5137 Other intervertebral disc degeneration, lumbosacral region: Secondary | ICD-10-CM | POA: Diagnosis not present

## 2012-04-12 DIAGNOSIS — X500XXA Overexertion from strenuous movement or load, initial encounter: Secondary | ICD-10-CM | POA: Insufficient documentation

## 2012-04-12 LAB — URINALYSIS, ROUTINE W REFLEX MICROSCOPIC
Leukocytes, UA: NEGATIVE
Protein, ur: NEGATIVE mg/dL
Specific Gravity, Urine: 1.013 (ref 1.005–1.030)
Urobilinogen, UA: 0.2 mg/dL (ref 0.0–1.0)

## 2012-04-12 LAB — URINE MICROSCOPIC-ADD ON

## 2012-04-12 MED ORDER — MORPHINE SULFATE 4 MG/ML IJ SOLN
4.0000 mg | Freq: Once | INTRAMUSCULAR | Status: AC
  Administered 2012-04-12: 4 mg via INTRAVENOUS
  Filled 2012-04-12: qty 1

## 2012-04-12 MED ORDER — HYDROMORPHONE HCL PF 1 MG/ML IJ SOLN
0.5000 mg | Freq: Once | INTRAMUSCULAR | Status: AC
  Administered 2012-04-12: 0.5 mg via INTRAVENOUS
  Filled 2012-04-12: qty 1

## 2012-04-12 MED ORDER — OXYCODONE-ACETAMINOPHEN 5-325 MG PO TABS: 1.0000 | ORAL_TABLET | ORAL | Status: AC | PRN

## 2012-04-12 MED ORDER — LORAZEPAM 2 MG/ML IJ SOLN
1.0000 mg | Freq: Once | INTRAMUSCULAR | Status: AC
  Administered 2012-04-12: 1 mg via INTRAVENOUS
  Filled 2012-04-12: qty 1

## 2012-04-12 MED ORDER — DIAZEPAM 5 MG PO TABS
5.0000 mg | ORAL_TABLET | Freq: Once | ORAL | Status: AC
  Administered 2012-04-12: 5 mg via ORAL
  Filled 2012-04-12: qty 1

## 2012-04-12 MED ORDER — DIAZEPAM 5 MG PO TABS: 5.0000 mg | ORAL_TABLET | Freq: Three times a day (TID) | ORAL | Status: AC | PRN

## 2012-04-12 MED ORDER — IBUPROFEN 600 MG PO TABS
600.0000 mg | ORAL_TABLET | Freq: Three times a day (TID) | ORAL | Status: AC | PRN
Start: 2012-04-12 — End: 2012-04-22

## 2012-04-12 NOTE — ED Notes (Signed)
Ambulated in room with 2 assit steady on feet some dizziness little pain Encouraged to use walker at home verbalized understanding and concerns

## 2012-04-12 NOTE — ED Provider Notes (Signed)
7:45 AM The patient reports her pain is improving but still not great.  She has obvious muscle spasm of her right lumbar spinal muscles.  She is no weakness in her lower extremities.  She has nothing to suggest that this is cauda equina or acute herniated disc that will require intervention.    Additional pain medicine will be given.  12:12 PM The patient's pain is now resolved after several doses of pain medicine.  Given the difficulty getting her pain under control MRI was obtained that demonstrated severe spinal stenosis with some L4 nerve compression which likely is the cause of her symptoms.  The patient has been referred to the neurosurgeon for followup.  She understands to return the ER for new or worsening symptoms.  She feels much better at this time was able ambulate in the room  The encounter diagnosis was Spinal stenosis of lumbar region with radiculopathy.  Mr Lumbar Spine Wo Contrast  04/12/2012  *RADIOLOGY REPORT*  Clinical Data: Severe low back pain with bilateral leg weakness. Right leg numbness  MRI LUMBAR SPINE WITHOUT CONTRAST  Technique:  Multiplanar and multiecho pulse sequences of the lumbar spine were obtained without intravenous contrast.  Comparison: None.  Findings: Negative for fracture or mass.  Small hemangioma L1 vertebral body.  Conus medullaris is normal and terminates at L1.  L1-2:  Negative  L2-3:  Mild disc bulging and mild facet degeneration without significant spinal stenosis.  L3-4:  2 mm anterior slip.  Disc bulging is present.  Moderate to advanced facet degeneration with facet and ligamentum flavum hypertrophy.  Moderate spinal stenosis.  Mild foraminal narrowing bilaterally.  L4-5:  4 mm anterior slip.  Disc degeneration is advanced on the right and mild on the left.  There is vertebral spurring on the right.  There is advanced facet hypertrophy.  There is moderate to severe spinal stenosis.  There is severe right foraminal encroachment with impingement of the  right L4 nerve root in the foramen.  L5-S1:  Disc degeneration with disc bulging and spurring on the left.  Left foraminal encroachment with impingement of the left L5 nerve root  IMPRESSION: Moderate spinal stenosis at L3-4.  Severe spinal stenosis at L4-5 with severe right foraminal encroachment and  impingement of the right L4 nerve root.  Left foraminal encroachment with impingement of the left L5 nerve root.  Original Report Authenticated By: Camelia Phenes, M.D.    I personally reviewed the imaging tests through PACS system     Lyanne Co, MD 04/12/12 1215

## 2012-04-12 NOTE — ED Provider Notes (Signed)
History     CSN: [id number]  Arrival date & time 04/12/12  0500   First MD Initiated Contact with Patient 04/12/12 [phone]      Chief Complaint  Patient presents with  . Back Pain    (Consider location/radiation/quality/duration/timing/severity/associated sxs/prior treatment) Patient is a 78 y.o. female presenting with back pain.  Back Pain    78 year old female presents to emergency department via EMS with complaint of severe right lower back pain. Patient reports she was rolling over and sitting up in bed to go to the bathroom when she had onset of pain. Patient felt like her right leg was numb and couldn't move when she had the onset pain. No known injury, no prior history of back pain, disc disease, or similar symptoms. Patient does report she has chronic right hip pain due to osteoarthritis, and "needs a hip replacement" patient without known history of aortic disease. Patient denies any urinary symptoms. Patient reports she feels better if she holds her hand over the area of pain.  History reviewed. No pertinent past medical history.  Past Surgical History  Procedure Date  . Joint replacement   . Cholecystectomy   . Eye surgery     History reviewed. No pertinent family history.  History  Substance Use Topics  . Smoking status: Never Smoker   . Smokeless tobacco: Not on file  . Alcohol Use: No    OB History    Grav Para Term Preterm Abortions TAB SAB Ect Mult Living            3      Review of Systems  Musculoskeletal: Positive for back pain.  All other systems reviewed and are negative.    Allergies  Review of patient's allergies indicates no known allergies.  Home Medications   Current Outpatient Rx  Name Route Sig Dispense Refill  . ASPIRIN 81 MG PO CHEW Oral Chew 81 mg by mouth daily.    Marland Kitchen HYDROCHLOROTHIAZIDE 25 MG PO TABS Oral Take 25 mg by mouth daily.    Marland Kitchen SIMVASTATIN 20 MG PO TABS Oral Take 20 mg by mouth every evening.      BP 152/82  Pulse 75   Temp 97.7 F (36.5 C)  Resp 18  SpO2 100%  Physical Exam  Nursing note and vitals reviewed. Constitutional: She is oriented to person, place, and time. She appears well-developed and well-nourished. She appears distressed (uncomfortable appearing).  HENT:  Head: Normocephalic and atraumatic.  Nose: Nose normal.  Mouth/Throat: Oropharynx is clear and moist.  Eyes: Conjunctivae are normal. Pupils are equal, round, and reactive to light.  Neck: Normal range of motion. Neck supple. No JVD present. No tracheal deviation present. No thyromegaly present.  Cardiovascular: Normal rate, regular rhythm, normal heart sounds and intact distal pulses.  Exam reveals no gallop and no friction rub.   No murmur heard. Pulmonary/Chest: Effort normal and breath sounds normal. No stridor. No respiratory distress. She has no wheezes. She has no rales. She exhibits no tenderness.  Abdominal: Soft. Bowel sounds are normal. She exhibits no distension and no mass. There is no tenderness. There is no rebound and no guarding.  Musculoskeletal: She exhibits tenderness (tenderness of palpation to right lower back paraspinal area. Palpation of the musculature causes reproduction of her pain).  Lymphadenopathy:    She has no cervical adenopathy.  Neurological: She is oriented to person, place, and time. She has normal reflexes. No cranial nerve deficit. She exhibits normal muscle tone. Coordination normal.  Patient has normal sensation to bilateral lower legs, normal reflexes, and has normal range of motion and strength to both legs. Straight leg raise does not worsen pain on either side  Skin: Skin is warm and dry. No rash noted. No erythema. No pallor.  Psychiatric: She has a normal mood and affect. Her behavior is normal. Judgment and thought content normal.    ED Course  Procedures (including critical care time)  Labs Reviewed  URINALYSIS, ROUTINE W REFLEX MICROSCOPIC - Abnormal; Notable for the following:     Hgb urine dipstick TRACE (*)     All other components within normal limits  URINE MICROSCOPIC-ADD ON   No results found.   No diagnosis found.    MDM  78 year old female with right lower back pain after twisting and sitting up in bed. Differential includes kidney stone, aortic aneurysm, muscle spasm, with dx most likely to be muscle spasm.  Pain is reproducible with palpation.  Will treat with morphine, valium and monitor for improvement.  7:37 AM Care passed to Dr Patria Mane awaiting improvement in symptoms.        Olivia Mackie, MD 04/12/12 [phone]

## 2012-04-12 NOTE — ED Notes (Signed)
Patient transported to MRI 

## 2012-04-12 NOTE — ED Notes (Signed)
[id number]<AV> Expected date:<BR> Expected time:<BR> Means of arrival:<BR> Comments:<BR> EMS fall/right hip pain

## 2012-04-12 NOTE — ED Notes (Signed)
Pt reports that she has sudden back pain when she tried to turn to her side while in bed; states that pain is "excruciating", also c/o numbness and tingling to her RLE.

## 2012-04-12 NOTE — ED Notes (Signed)
Per EMS report, Pt from home: Pt was laying in bed and moved. Then instantly was in pain described as numbness, tingling, weakness in her right arm and legs.  Pain is on her lower right back.  Pt feels like pain is also on her spine.  No hx of back pain.  Hx of HTN and high cholesterol. HR: 100, Palpated BP: 160.

## 2012-04-18 DIAGNOSIS — M48061 Spinal stenosis, lumbar region without neurogenic claudication: Secondary | ICD-10-CM | POA: Diagnosis not present

## 2012-04-18 DIAGNOSIS — M545 Low back pain: Secondary | ICD-10-CM | POA: Diagnosis not present

## 2012-04-18 DIAGNOSIS — M47817 Spondylosis without myelopathy or radiculopathy, lumbosacral region: Secondary | ICD-10-CM | POA: Diagnosis not present

## 2012-05-10 DIAGNOSIS — Z01419 Encounter for gynecological examination (general) (routine) without abnormal findings: Secondary | ICD-10-CM | POA: Diagnosis not present

## 2012-05-10 DIAGNOSIS — Z124 Encounter for screening for malignant neoplasm of cervix: Secondary | ICD-10-CM | POA: Diagnosis not present

## 2012-05-11 DIAGNOSIS — M169 Osteoarthritis of hip, unspecified: Secondary | ICD-10-CM | POA: Diagnosis not present

## 2012-05-16 DIAGNOSIS — I679 Cerebrovascular disease, unspecified: Secondary | ICD-10-CM | POA: Diagnosis not present

## 2012-05-16 DIAGNOSIS — M1710 Unilateral primary osteoarthritis, unspecified knee: Secondary | ICD-10-CM | POA: Diagnosis not present

## 2012-05-16 DIAGNOSIS — IMO0002 Reserved for concepts with insufficient information to code with codable children: Secondary | ICD-10-CM | POA: Diagnosis not present

## 2012-05-16 DIAGNOSIS — I10 Essential (primary) hypertension: Secondary | ICD-10-CM | POA: Diagnosis not present

## 2012-06-26 ENCOUNTER — Encounter (HOSPITAL_COMMUNITY): Payer: Self-pay | Admitting: Pharmacy Technician

## 2012-06-27 ENCOUNTER — Other Ambulatory Visit: Payer: Self-pay | Admitting: Orthopedic Surgery

## 2012-06-27 MED ORDER — BUPIVACAINE LIPOSOME 1.3 % IJ SUSP: 20.0000 mL | Freq: Once | INTRAMUSCULAR | Status: DC

## 2012-06-27 MED ORDER — DEXAMETHASONE SODIUM PHOSPHATE 10 MG/ML IJ SOLN: 10.0000 mg | Freq: Once | INTRAMUSCULAR | Status: DC

## 2012-06-27 NOTE — Progress Notes (Signed)
Preoperative surgical orders have been place into the [facility] hospital system for Ottis Stain on 06/27/2012, 5:13 PM  by Patrica Duel for surgery on 07/09/2012.  Preop Total Hip orders including Experel Injecion, IV Tylenol, and IV Decadron as long as there are no contraindications to the above medications. [name], PA-C

## 2012-06-27 NOTE — Progress Notes (Signed)
Dr. Lequita Halt of [facility] : When you can, we need orders on Aimme Starzynski please Surg is 10/14 coming for preop 07/03/12 Thank you

## 2012-07-02 ENCOUNTER — Other Ambulatory Visit: Payer: Self-pay | Admitting: Orthopedic Surgery

## 2012-07-02 DIAGNOSIS — M169 Osteoarthritis of hip, unspecified: Secondary | ICD-10-CM | POA: Diagnosis not present

## 2012-07-02 MED ORDER — BUPIVACAINE LIPOSOME 1.3 % IJ SUSP: 20.0000 mL | Freq: Once | INTRAMUSCULAR | Status: DC

## 2012-07-02 MED ORDER — DEXAMETHASONE SODIUM PHOSPHATE 10 MG/ML IJ SOLN: 10.0000 mg | Freq: Once | INTRAMUSCULAR | Status: DC

## 2012-07-03 ENCOUNTER — Ambulatory Visit (HOSPITAL_COMMUNITY): Admission: RE | Admit: 2012-07-03 | Payer: Medicare Other | Source: Ambulatory Visit

## 2012-07-03 ENCOUNTER — Ambulatory Visit (HOSPITAL_COMMUNITY)
Admission: RE | Admit: 2012-07-03 | Discharge: 2012-07-03 | Disposition: A | Discharge status: Home / Self Care | Payer: Medicare Other | Source: Ambulatory Visit | Attending: Orthopedic Surgery | Admitting: Orthopedic Surgery

## 2012-07-03 ENCOUNTER — Other Ambulatory Visit: Payer: Self-pay

## 2012-07-03 ENCOUNTER — Encounter (HOSPITAL_COMMUNITY): Payer: Self-pay

## 2012-07-03 ENCOUNTER — Ambulatory Visit (HOSPITAL_COMMUNITY): Payer: Medicare Other

## 2012-07-03 DIAGNOSIS — M1610 Unilateral primary osteoarthritis, unspecified hip: Secondary | ICD-10-CM | POA: Insufficient documentation

## 2012-07-03 DIAGNOSIS — I10 Essential (primary) hypertension: Secondary | ICD-10-CM | POA: Diagnosis not present

## 2012-07-03 DIAGNOSIS — M169 Osteoarthritis of hip, unspecified: Secondary | ICD-10-CM | POA: Insufficient documentation

## 2012-07-03 DIAGNOSIS — Z01812 Encounter for preprocedural laboratory examination: Secondary | ICD-10-CM | POA: Insufficient documentation

## 2012-07-03 DIAGNOSIS — Z01818 Encounter for other preprocedural examination: Secondary | ICD-10-CM | POA: Insufficient documentation

## 2012-07-03 DIAGNOSIS — Z01810 Encounter for preprocedural cardiovascular examination: Secondary | ICD-10-CM | POA: Insufficient documentation

## 2012-07-03 HISTORY — DX: Nausea with vomiting, unspecified: R11.2

## 2012-07-03 HISTORY — DX: Other specified postprocedural states: Z98.890

## 2012-07-03 HISTORY — DX: Unspecified osteoarthritis, unspecified site: M19.90

## 2012-07-03 HISTORY — DX: Essential (primary) hypertension: I10

## 2012-07-03 HISTORY — DX: Personal history of other medical treatment: Z92.89

## 2012-07-03 LAB — CBC
Platelets: 416 10*3/uL — ABNORMAL HIGH (ref 150–400)
RBC: 3.95 MIL/uL (ref 3.87–5.11)
RDW: 12.5 % (ref 11.5–15.5)
WBC: 6.4 10*3/uL (ref 4.0–10.5)

## 2012-07-03 LAB — COMPREHENSIVE METABOLIC PANEL
ALT: 17 U/L (ref 0–35)
AST: 19 U/L (ref 0–37)
Albumin: 4 g/dL (ref 3.5–5.2)
Alkaline Phosphatase: 64 U/L (ref 39–117)
CO2: 32 mEq/L (ref 19–32)
Chloride: 91 mEq/L — ABNORMAL LOW (ref 96–112)
Potassium: 3 mEq/L — ABNORMAL LOW (ref 3.5–5.1)
Total Bilirubin: 0.2 mg/dL — ABNORMAL LOW (ref 0.3–1.2)

## 2012-07-03 LAB — SURGICAL PCR SCREEN: Staphylococcus aureus: POSITIVE — AB

## 2012-07-03 LAB — URINALYSIS, ROUTINE W REFLEX MICROSCOPIC
Bilirubin Urine: NEGATIVE
Glucose, UA: NEGATIVE mg/dL
Hgb urine dipstick: NEGATIVE
Nitrite: NEGATIVE
Specific Gravity, Urine: 1.013 (ref 1.005–1.030)
pH: 7 (ref 5.0–8.0)

## 2012-07-03 NOTE — Pre-Procedure Instructions (Signed)
DOES NOT DESIRE TO HAVE TYPE AND SCREEN DONE TODAY

## 2012-07-03 NOTE — Pre-Procedure Instructions (Signed)
Clearance with note Dr  Tenny Craw on chart

## 2012-07-03 NOTE — Pre-Procedure Instructions (Signed)
Faxed abnormal CBC and CMET to Dr Lequita Halt. Notified [name] PA of abnormal  labs at 1250

## 2012-07-03 NOTE — Patient Instructions (Addendum)
[name]  07/03/2012   Your procedure is scheduled on:  07/09/12   Monday   Surgery  415PM- 535PM  Report to [facility] at      145PM.  Call this number if you have problems the morning of surgery: [phone]           Remember: MAY HAVE CLEAR LIQUIDS  Monday  UNTIL 0745 am   THEN NONE- see clear liquid sheet given  Do not eat food  :After Midnight.   Sunday NIGHT   Take these medicines the morning of surgery with A SIP OF WATER:  TYLENOL IF NEEDED   .  Contacts, dentures or partial plates can not be worn to surgery  Leave suitcase in the car. After surgery it may be brought to your room.  For patients admitted to the hospital, checkout time is 11:00 AM day of  discharge.             SPECIAL INSTRUCTIONS- SEE [place] PREPARING FOR SURGERY INSTRUCTION SHEET-     DO NOT WEAR JEWELRY, LOTIONS, POWDERS, OR PERFUMES.  WOMEN-- DO NOT SHAVE LEGS OR UNDERARMS FOR 12 HOURS BEFORE SHOWERS. MEN MAY SHAVE FACE.  Patients discharged the day of surgery will not be allowed to drive home. IF going home the day of surgery, you must have a driver and someone to stay with you for the first 24 hours  Name and phone number of your driver:        [name]  daughter                                                                Please read over the following fact sheets that you were given: MRSA Information, Incentive Spirometry Sheet, Blood Transfusion Sheet  Information                                                                                   Chava Dulac  PST 336  [phone]

## 2012-07-06 NOTE — Progress Notes (Addendum)
1024 Spoke with [name] about time change to arrive at 1230 for [phone] surgery,clear liquids until 0645 07-09-12 then NPO for surgery.

## 2012-07-08 ENCOUNTER — Other Ambulatory Visit: Payer: Self-pay | Admitting: Orthopedic Surgery

## 2012-07-08 NOTE — H&P (Signed)
[name]  DOB: [birth year] Undefined / Language: [place] / Race: White Female  Date of Admission:  07/09/2012  Chief Complaint:  Right Hip Pain  History of Present Illness The patient is a 78 year old female who comes in for a preoperative History and Physical. The patient is scheduled for a right total hip arthroplasty to be performed by Dr. Gus Rankin. Aluisio, MD at [facility] on 07/09/2012. The patient is a 78 year old female who presents for their right hip pain and trochanteric bursitis. They are 2 month(s) out from injection with Brett Canales, PAC. Symptoms reported today include: pain. Note for "Follow-up Hip": She states the injection did not help at all. She is still having a lot of pain. She said she saw Dr. Blanche East for her back recently and he told her he would send over her notes and films, but we have not received anything. Unfortunately the trochanteric injection did not help her. She has significant groin pain, lateral hip pain and pain radiating into the her knee. She has had that knee replaced and has done great with that. Unfortunately the hip is getting progressively worse. She is an avid [place] and said she is going to pass up on football this season in order to get the hip fixed. It is hurting day and night. It is limiting her activities. She is ready to proceed with hip repalcement. They have been treated conservatively in the past for the above stated problem and despite conservative measures, they continue to have progressive pain and severe functional limitations and dysfunction. They have failed non-operative management. It is felt that they would benefit from undergoing total joint replacement. Risks and benefits of the procedure have been discussed with the patient and they elect to proceed with surgery. There are no active contraindications to surgery such as ongoing infection or rapidly progressive neurological  disease.     Problem List Osteoarthritis, hip (715.35)   Allergies No Known Drug Allergies   Family History Cerebrovascular Accident. brother Diabetes Mellitus. brother Osteoarthritis. sister   Social History Exercise. Exercises daily; does other Illicit drug use. no Living situation. live alone Drug/Alcohol Rehab (Previously). no Children. 3 Current work status. retired Financial planner (Currently). no Tobacco use. former smoker; smoke(d) less than 1/2 pack(s) per day Marital status. widowed Pain Contract. no Tobacco / smoke exposure. no Alcohol use. never consumed alcohol Post-Surgical Plans. Plan is to go home with daughter. Advance Directives. Living Will and Healthcare POA  Past Surgical History Hysterectomy. complete (non-cancerous) Total Knee Replacement. right Tubal Ligation Appendectomy Cataract Surgery. bilateral Gallbladder Surgery. laporoscopic   Past Medical History Hypercholesterolemia Skin Cancer High blood pressure   Review of Systems General:Not Present- Chills, Fever, Night Sweats, Fatigue, Weight Gain, Weight Loss and Memory Loss. Skin:Not Present- Hives, Itching, Rash, Eczema and Lesions. HEENT:Not Present- Tinnitus, Headache, Double Vision, Visual Loss, Hearing Loss and Dentures. Respiratory:Not Present- Shortness of breath with exertion, Shortness of breath at rest, Allergies, Coughing up blood and Chronic Cough. Cardiovascular:Not Present- Chest Pain, Racing/skipping heartbeats, Difficulty Breathing Lying Down, Murmur, Swelling and Palpitations. Gastrointestinal:Not Present- Bloody Stool, Heartburn, Abdominal Pain, Vomiting, Nausea, Constipation, Diarrhea, Difficulty Swallowing, Jaundice and Loss of appetitie. Female Genitourinary:Not Present- Blood in Urine, Urinary frequency, Weak urinary stream, Discharge, Flank Pain, Incontinence, Painful Urination, Urgency, Urinary Retention and Urinating at  Night. Musculoskeletal:Present- Joint Pain. Not Present- Muscle Weakness, Muscle Pain, Joint Swelling, Back Pain, Morning Stiffness and Spasms. Neurological:Not Present- Tremor, Dizziness, Blackout spells, Paralysis, Difficulty with  balance and Weakness. Psychiatric:Not Present- Insomnia.   Vitals Weight: 130 lb Height: 63 in Body Surface Area: 1.62 m Body Mass Index: 23.03 kg/m Pulse: 64 (Regular) Resp.: 12 (Unlabored) BP: 128/68 (Sitting, Right Arm, Standard)    Physical Exam The physical exam findings are as follows:  Patient is a 78 year old female with conitnued hip pain.   General Mental Status - Alert, cooperative and good historian. General Appearance- pleasant. Not in acute distress. Orientation- Oriented X3. Build & Nutrition- Well nourished and Well developed.   Head and Neck Head- normocephalic, atraumatic . Neck Global Assessment- supple. no bruit auscultated on the right and no bruit auscultated on the left.   Eye Pupil- Bilateral- Regular and Round. Note: wears glasses Motion- Bilateral- EOMI.   ENMT upper and lower dentures  Chest and Lung Exam Auscultation: Breath sounds:- clear at anterior chest wall and - clear at posterior chest wall. Adventitious sounds:- No Adventitious sounds.   Cardiovascular Auscultation:Rhythm- Regular rate and rhythm. Heart Sounds- S1 WNL and S2 WNL. Murmurs & Other Heart Sounds:Auscultation of the heart reveals - No Murmurs.   Abdomen Palpation/Percussion:Tenderness- Abdomen is non-tender to palpation. Rigidity (guarding)- Abdomen is soft. Auscultation:Auscultation of the abdomen reveals - Bowel sounds normal.   Female Genitourinary Not done, not pertinent to present illness  Musculoskeletal She is a well developed female alert and oriented in no apparent distress. Her right hip can be flexed 90 with no internal or external rotation and no abduction. Her right knee  remains 0 to 130 with no tenderness or instability.  RADIOGRAPHS: X-rays from June, AP pelvis and lateral of the right hip, she has severe bone on bone arthritis. Absolutely no joint space left with subchondral cystic formation in the femoral head and acetabulum.  Assessment & Plan Osteoarthritis, hip (715.35) Impression: Right Hip  Note: Patient is for a Right Total Hip Replacement by Dr. Lequita Halt.  Patinet states that she gets sick with every time with anesthesia in the past. She also states that she has had difficulty getting a foley catheter in the past.  PCP - Dr. Vianne Bulls  Signed electronically by Roberts Gaudy, PA-C

## 2012-07-09 ENCOUNTER — Inpatient Hospital Stay (HOSPITAL_COMMUNITY)
Admission: RE | Admit: 2012-07-09 | Discharge: 2012-07-13 | DRG: 469 | Disposition: A | Discharge status: Skilled Nursing Facility | Payer: Medicare Other | Source: Ambulatory Visit | Attending: Orthopedic Surgery | Admitting: Orthopedic Surgery

## 2012-07-09 ENCOUNTER — Inpatient Hospital Stay (HOSPITAL_COMMUNITY): Payer: Medicare Other

## 2012-07-09 ENCOUNTER — Encounter (HOSPITAL_COMMUNITY): Payer: Self-pay | Admitting: Anesthesiology

## 2012-07-09 ENCOUNTER — Encounter (HOSPITAL_COMMUNITY)
Admission: RE | Disposition: A | Discharge status: Skilled Nursing Facility | Payer: Self-pay | Source: Ambulatory Visit | Attending: Orthopedic Surgery

## 2012-07-09 ENCOUNTER — Encounter (HOSPITAL_COMMUNITY): Payer: Self-pay | Admitting: *Deleted

## 2012-07-09 ENCOUNTER — Inpatient Hospital Stay (HOSPITAL_COMMUNITY): Payer: Medicare Other | Admitting: Anesthesiology

## 2012-07-09 DIAGNOSIS — Z5189 Encounter for other specified aftercare: Secondary | ICD-10-CM | POA: Diagnosis not present

## 2012-07-09 DIAGNOSIS — J189 Pneumonia, unspecified organism: Secondary | ICD-10-CM | POA: Diagnosis not present

## 2012-07-09 DIAGNOSIS — M169 Osteoarthritis of hip, unspecified: Secondary | ICD-10-CM | POA: Diagnosis present

## 2012-07-09 DIAGNOSIS — Z96649 Presence of unspecified artificial hip joint: Secondary | ICD-10-CM | POA: Diagnosis not present

## 2012-07-09 DIAGNOSIS — I10 Essential (primary) hypertension: Secondary | ICD-10-CM | POA: Diagnosis present

## 2012-07-09 DIAGNOSIS — G92 Toxic encephalopathy: Secondary | ICD-10-CM | POA: Diagnosis not present

## 2012-07-09 DIAGNOSIS — E785 Hyperlipidemia, unspecified: Secondary | ICD-10-CM | POA: Diagnosis not present

## 2012-07-09 DIAGNOSIS — M1610 Unilateral primary osteoarthritis, unspecified hip: Principal | ICD-10-CM | POA: Diagnosis present

## 2012-07-09 DIAGNOSIS — M25559 Pain in unspecified hip: Secondary | ICD-10-CM | POA: Diagnosis not present

## 2012-07-09 DIAGNOSIS — E876 Hypokalemia: Secondary | ICD-10-CM | POA: Diagnosis not present

## 2012-07-09 DIAGNOSIS — D62 Acute posthemorrhagic anemia: Secondary | ICD-10-CM | POA: Diagnosis not present

## 2012-07-09 DIAGNOSIS — R4182 Altered mental status, unspecified: Secondary | ICD-10-CM | POA: Diagnosis not present

## 2012-07-09 DIAGNOSIS — M25469 Effusion, unspecified knee: Secondary | ICD-10-CM | POA: Diagnosis not present

## 2012-07-09 DIAGNOSIS — G929 Unspecified toxic encephalopathy: Secondary | ICD-10-CM | POA: Diagnosis not present

## 2012-07-09 DIAGNOSIS — T4275XA Adverse effect of unspecified antiepileptic and sedative-hypnotic drugs, initial encounter: Secondary | ICD-10-CM | POA: Diagnosis not present

## 2012-07-09 DIAGNOSIS — E871 Hypo-osmolality and hyponatremia: Secondary | ICD-10-CM | POA: Diagnosis not present

## 2012-07-09 DIAGNOSIS — J690 Pneumonitis due to inhalation of food and vomit: Secondary | ICD-10-CM | POA: Diagnosis not present

## 2012-07-09 DIAGNOSIS — R1312 Dysphagia, oropharyngeal phase: Secondary | ICD-10-CM | POA: Diagnosis not present

## 2012-07-09 DIAGNOSIS — S79929A Unspecified injury of unspecified thigh, initial encounter: Secondary | ICD-10-CM | POA: Diagnosis not present

## 2012-07-09 DIAGNOSIS — K5900 Constipation, unspecified: Secondary | ICD-10-CM | POA: Diagnosis not present

## 2012-07-09 DIAGNOSIS — M1990 Unspecified osteoarthritis, unspecified site: Secondary | ICD-10-CM | POA: Diagnosis not present

## 2012-07-09 HISTORY — PX: TOTAL HIP ARTHROPLASTY: SHX124

## 2012-07-09 LAB — TYPE AND SCREEN
ABO/RH(D): A POS
Antibody Screen: NEGATIVE

## 2012-07-09 SURGERY — ARTHROPLASTY, HIP, TOTAL,POSTERIOR APPROACH
Anesthesia: Spinal | Site: Hip | Laterality: Right | Wound class: Clean

## 2012-07-09 MED ORDER — MENTHOL 3 MG MT LOZG
1.0000 | LOZENGE | OROMUCOSAL | Status: DC | PRN
  Filled 2012-07-09: qty 9

## 2012-07-09 MED ORDER — POLYETHYLENE GLYCOL 3350 17 G PO PACK
17.0000 g | PACK | Freq: Every day | ORAL | Status: DC | PRN
  Administered 2012-07-12: 17 g via ORAL

## 2012-07-09 MED ORDER — PROPOFOL 10 MG/ML IV EMUL
INTRAVENOUS | Status: DC | PRN
  Administered 2012-07-09: 75 ug/kg/min via INTRAVENOUS

## 2012-07-09 MED ORDER — METOCLOPRAMIDE HCL 5 MG/ML IJ SOLN
5.0000 mg | Freq: Three times a day (TID) | INTRAMUSCULAR | Status: DC | PRN
  Administered 2012-07-10: 10 mg via INTRAVENOUS
  Filled 2012-07-09: qty 2

## 2012-07-09 MED ORDER — DEXTROSE 5 % IV SOLN: 3.0000 g | INTRAVENOUS | Status: DC

## 2012-07-09 MED ORDER — TRAMADOL HCL 50 MG PO TABS
50.0000 mg | ORAL_TABLET | Freq: Four times a day (QID) | ORAL | Status: DC | PRN
  Administered 2012-07-10: 100 mg via ORAL
  Administered 2012-07-12 – 2012-07-13 (×3): 50 mg via ORAL
  Filled 2012-07-09 (×2): qty 1
  Filled 2012-07-09: qty 2
  Filled 2012-07-09: qty 1

## 2012-07-09 MED ORDER — CEFAZOLIN SODIUM-DEXTROSE 2-3 GM-% IV SOLR
2.0000 g | INTRAVENOUS | Status: AC
  Administered 2012-07-09: 2 g via INTRAVENOUS

## 2012-07-09 MED ORDER — METOCLOPRAMIDE HCL 10 MG PO TABS: 5.0000 mg | ORAL_TABLET | Freq: Three times a day (TID) | ORAL | Status: DC | PRN

## 2012-07-09 MED ORDER — ACETAMINOPHEN 10 MG/ML IV SOLN
1000.0000 mg | Freq: Four times a day (QID) | INTRAVENOUS | Status: AC
  Administered 2012-07-09 – 2012-07-10 (×4): 1000 mg via INTRAVENOUS
  Filled 2012-07-09 (×6): qty 100

## 2012-07-09 MED ORDER — MIDAZOLAM HCL 5 MG/5ML IJ SOLN
INTRAMUSCULAR | Status: DC | PRN
  Administered 2012-07-09 (×2): 1 mg via INTRAVENOUS

## 2012-07-09 MED ORDER — CEFAZOLIN SODIUM 1-5 GM-% IV SOLN
1.0000 g | Freq: Four times a day (QID) | INTRAVENOUS | Status: AC
  Administered 2012-07-09 – 2012-07-10 (×2): 1 g via INTRAVENOUS
  Filled 2012-07-09 (×2): qty 50

## 2012-07-09 MED ORDER — FLEET ENEMA 7-19 GM/118ML RE ENEM: 1.0000 | ENEMA | Freq: Once | RECTAL | Status: AC | PRN

## 2012-07-09 MED ORDER — BUPIVACAINE HCL (PF) 0.5 % IJ SOLN
INTRAMUSCULAR | Status: DC | PRN
  Administered 2012-07-09: 3 mL

## 2012-07-09 MED ORDER — OXYCODONE HCL 5 MG PO TABS
5.0000 mg | ORAL_TABLET | ORAL | Status: DC | PRN
  Administered 2012-07-09 – 2012-07-11 (×12): 10 mg via ORAL
  Filled 2012-07-09 (×12): qty 2

## 2012-07-09 MED ORDER — PROMETHAZINE HCL 25 MG/ML IJ SOLN: 6.2500 mg | INTRAMUSCULAR | Status: DC | PRN

## 2012-07-09 MED ORDER — PHENYLEPHRINE HCL 10 MG/ML IJ SOLN
INTRAMUSCULAR | Status: DC | PRN
  Administered 2012-07-09: 80 ug via INTRAVENOUS

## 2012-07-09 MED ORDER — HYDROMORPHONE HCL PF 1 MG/ML IJ SOLN: 0.2500 mg | INTRAMUSCULAR | Status: DC | PRN

## 2012-07-09 MED ORDER — ONDANSETRON HCL 4 MG PO TABS: 4.0000 mg | ORAL_TABLET | Freq: Four times a day (QID) | ORAL | Status: DC | PRN

## 2012-07-09 MED ORDER — METHOCARBAMOL 500 MG PO TABS
500.0000 mg | ORAL_TABLET | Freq: Four times a day (QID) | ORAL | Status: DC | PRN
  Administered 2012-07-10 – 2012-07-12 (×6): 500 mg via ORAL
  Filled 2012-07-09 (×6): qty 1

## 2012-07-09 MED ORDER — ACETAMINOPHEN 10 MG/ML IV SOLN
1000.0000 mg | Freq: Once | INTRAVENOUS | Status: AC
  Administered 2012-07-09: 1000 mg via INTRAVENOUS

## 2012-07-09 MED ORDER — MEPERIDINE HCL 50 MG/ML IJ SOLN: 6.2500 mg | INTRAMUSCULAR | Status: DC | PRN

## 2012-07-09 MED ORDER — METHOCARBAMOL 100 MG/ML IJ SOLN
500.0000 mg | Freq: Four times a day (QID) | INTRAVENOUS | Status: DC | PRN
  Filled 2012-07-09: qty 5

## 2012-07-09 MED ORDER — DIAZEPAM 5 MG PO TABS
5.0000 mg | ORAL_TABLET | Freq: Four times a day (QID) | ORAL | Status: DC | PRN
  Administered 2012-07-11: 5 mg via ORAL
  Filled 2012-07-09: qty 1

## 2012-07-09 MED ORDER — BISACODYL 10 MG RE SUPP: 10.0000 mg | Freq: Every day | RECTAL | Status: DC | PRN

## 2012-07-09 MED ORDER — POTASSIUM CHLORIDE IN NACL 20-0.9 MEQ/L-% IV SOLN
INTRAVENOUS | Status: DC
  Administered 2012-07-09 – 2012-07-11 (×2): via INTRAVENOUS
  Filled 2012-07-09 (×2): qty 1000

## 2012-07-09 MED ORDER — MORPHINE SULFATE 2 MG/ML IJ SOLN
1.0000 mg | INTRAMUSCULAR | Status: DC | PRN
  Administered 2012-07-09 – 2012-07-11 (×2): 1 mg via INTRAVENOUS
  Filled 2012-07-09 (×2): qty 1

## 2012-07-09 MED ORDER — LACTATED RINGERS IV SOLN: INTRAVENOUS | Status: DC

## 2012-07-09 MED ORDER — EPHEDRINE SULFATE 50 MG/ML IJ SOLN
INTRAMUSCULAR | Status: DC | PRN
  Administered 2012-07-09: 10 mg via INTRAVENOUS
  Administered 2012-07-09 (×2): 5 mg via INTRAVENOUS

## 2012-07-09 MED ORDER — SODIUM CHLORIDE 0.9 % IV SOLN: INTRAVENOUS | Status: DC

## 2012-07-09 MED ORDER — LACTATED RINGERS IV SOLN
INTRAVENOUS | Status: DC | PRN
  Administered 2012-07-09 (×2): via INTRAVENOUS

## 2012-07-09 MED ORDER — HYDROCHLOROTHIAZIDE 25 MG PO TABS
25.0000 mg | ORAL_TABLET | Freq: Every day | ORAL | Status: DC
  Administered 2012-07-10 – 2012-07-12 (×3): 25 mg via ORAL
  Filled 2012-07-09 (×3): qty 1

## 2012-07-09 MED ORDER — SIMVASTATIN 20 MG PO TABS
20.0000 mg | ORAL_TABLET | Freq: Every day | ORAL | Status: DC
  Administered 2012-07-09 – 2012-07-12 (×4): 20 mg via ORAL
  Filled 2012-07-09 (×5): qty 1

## 2012-07-09 MED ORDER — DIPHENHYDRAMINE HCL 12.5 MG/5ML PO ELIX: 12.5000 mg | ORAL_SOLUTION | ORAL | Status: DC | PRN

## 2012-07-09 MED ORDER — RIVAROXABAN 10 MG PO TABS
10.0000 mg | ORAL_TABLET | Freq: Every day | ORAL | Status: DC
  Administered 2012-07-10 – 2012-07-13 (×4): 10 mg via ORAL
  Filled 2012-07-09 (×5): qty 1

## 2012-07-09 MED ORDER — BUPIVACAINE LIPOSOME 1.3 % IJ SUSP
20.0000 mL | Freq: Once | INTRAMUSCULAR | Status: AC
  Administered 2012-07-09: 20 mL
  Filled 2012-07-09: qty 20

## 2012-07-09 MED ORDER — ACETAMINOPHEN 325 MG PO TABS
650.0000 mg | ORAL_TABLET | Freq: Four times a day (QID) | ORAL | Status: DC | PRN
  Administered 2012-07-11 (×2): 650 mg via ORAL
  Filled 2012-07-09 (×2): qty 2

## 2012-07-09 MED ORDER — KETAMINE HCL 10 MG/ML IJ SOLN
INTRAMUSCULAR | Status: DC | PRN
  Administered 2012-07-09 (×2): 10 mg via INTRAVENOUS

## 2012-07-09 MED ORDER — FENTANYL CITRATE 0.05 MG/ML IJ SOLN
INTRAMUSCULAR | Status: DC | PRN
  Administered 2012-07-09 (×2): 50 ug via INTRAVENOUS

## 2012-07-09 MED ORDER — DOCUSATE SODIUM 100 MG PO CAPS
100.0000 mg | ORAL_CAPSULE | Freq: Two times a day (BID) | ORAL | Status: DC
  Administered 2012-07-09 – 2012-07-13 (×8): 100 mg via ORAL

## 2012-07-09 MED ORDER — PHENOL 1.4 % MT LIQD
1.0000 | OROMUCOSAL | Status: DC | PRN
  Administered 2012-07-12: 1 via OROMUCOSAL
  Filled 2012-07-09 (×2): qty 177

## 2012-07-09 MED ORDER — ONDANSETRON HCL 4 MG/2ML IJ SOLN
4.0000 mg | Freq: Four times a day (QID) | INTRAMUSCULAR | Status: DC | PRN
  Administered 2012-07-10 (×2): 4 mg via INTRAVENOUS
  Filled 2012-07-09 (×2): qty 2

## 2012-07-09 MED ORDER — ACETAMINOPHEN 650 MG RE SUPP
650.0000 mg | Freq: Four times a day (QID) | RECTAL | Status: DC | PRN
  Administered 2012-07-12: 650 mg via RECTAL
  Filled 2012-07-09: qty 1

## 2012-07-09 SURGICAL SUPPLY — 51 items
BAG ZIPLOCK 12X15 (MISCELLANEOUS) ×2 IMPLANT
BIT DRILL 2.8X128 (BIT) ×2 IMPLANT
BLADE EXTENDED COATED 6.5IN (ELECTRODE) ×2 IMPLANT
BLADE SAW SAG 73X25 THK (BLADE) ×1
BLADE SAW SGTL 73X25 THK (BLADE) ×1 IMPLANT
CLOTH BEACON ORANGE TIMEOUT ST (SAFETY) ×2 IMPLANT
CLSR STERI-STRIP ANTIMIC 1/2X4 (GAUZE/BANDAGES/DRESSINGS) ×2 IMPLANT
DECANTER SPIKE VIAL GLASS SM (MISCELLANEOUS) ×2 IMPLANT
DRAPE INCISE IOBAN 66X45 STRL (DRAPES) ×2 IMPLANT
DRAPE ORTHO SPLIT 77X108 STRL (DRAPES) ×2
DRAPE POUCH INSTRU U-SHP 10X18 (DRAPES) ×2 IMPLANT
DRAPE SURG ORHT 6 SPLT 77X108 (DRAPES) ×2 IMPLANT
DRAPE U-SHAPE 47X51 STRL (DRAPES) ×2 IMPLANT
DRSG ADAPTIC 3X8 NADH LF (GAUZE/BANDAGES/DRESSINGS) ×2 IMPLANT
DRSG MEPILEX BORDER 4X4 (GAUZE/BANDAGES/DRESSINGS) ×2 IMPLANT
DRSG MEPILEX BORDER 4X8 (GAUZE/BANDAGES/DRESSINGS) ×2 IMPLANT
DURAPREP 26ML APPLICATOR (WOUND CARE) ×2 IMPLANT
ELECT REM PT RETURN 9FT ADLT (ELECTROSURGICAL) ×2
ELECTRODE REM PT RTRN 9FT ADLT (ELECTROSURGICAL) ×1 IMPLANT
EVACUATOR 1/8 PVC DRAIN (DRAIN) ×2 IMPLANT
FACESHIELD LNG OPTICON STERILE (SAFETY) ×8 IMPLANT
GLOVE BIO SURGEON STRL SZ8 (GLOVE) ×2 IMPLANT
GLOVE BIOGEL PI IND STRL 8 (GLOVE) ×2 IMPLANT
GLOVE BIOGEL PI INDICATOR 8 (GLOVE) ×2
GLOVE ECLIPSE 8.0 STRL XLNG CF (GLOVE) ×2 IMPLANT
GLOVE SURG SS PI 6.5 STRL IVOR (GLOVE) ×6 IMPLANT
GLOVE SURG SS PI 7.0 STRL IVOR (GLOVE) ×4 IMPLANT
GOWN STRL NON-REIN LRG LVL3 (GOWN DISPOSABLE) ×4 IMPLANT
GOWN STRL REIN XL XLG (GOWN DISPOSABLE) ×2 IMPLANT
IMMOBILIZER KNEE 20 (SOFTGOODS) ×2
IMMOBILIZER KNEE 20 THIGH 36 (SOFTGOODS) ×1 IMPLANT
KIT BASIN OR (CUSTOM PROCEDURE TRAY) ×2 IMPLANT
MANIFOLD NEPTUNE II (INSTRUMENTS) ×2 IMPLANT
NDL SAFETY ECLIPSE 18X1.5 (NEEDLE) ×1 IMPLANT
NEEDLE HYPO 18GX1.5 SHARP (NEEDLE) ×1
NS IRRIG 1000ML POUR BTL (IV SOLUTION) ×2 IMPLANT
PACK TOTAL JOINT (CUSTOM PROCEDURE TRAY) ×2 IMPLANT
PASSER SUT SWANSON 36MM LOOP (INSTRUMENTS) ×2 IMPLANT
POSITIONER SURGICAL ARM (MISCELLANEOUS) ×2 IMPLANT
SPONGE GAUZE 4X4 12PLY (GAUZE/BANDAGES/DRESSINGS) ×2 IMPLANT
STRIP CLOSURE SKIN 1/2X4 (GAUZE/BANDAGES/DRESSINGS) ×4 IMPLANT
SUT ETHIBOND NAB CT1 #1 30IN (SUTURE) ×4 IMPLANT
SUT MNCRL AB 4-0 PS2 18 (SUTURE) ×2 IMPLANT
SUT VIC AB 2-0 CT1 27 (SUTURE) ×3
SUT VIC AB 2-0 CT1 TAPERPNT 27 (SUTURE) ×3 IMPLANT
SUT VLOC 180 0 24IN GS25 (SUTURE) ×4 IMPLANT
SYR 50ML LL SCALE MARK (SYRINGE) ×2 IMPLANT
TOWEL OR 17X26 10 PK STRL BLUE (TOWEL DISPOSABLE) ×4 IMPLANT
TOWEL OR NON WOVEN STRL DISP B (DISPOSABLE) ×2 IMPLANT
TRAY FOLEY CATH 14FRSI W/METER (CATHETERS) ×2 IMPLANT
WATER STERILE IRR 1500ML POUR (IV SOLUTION) ×2 IMPLANT

## 2012-07-09 NOTE — Transfer of Care (Signed)
Immediate Anesthesia Transfer of Care Note  Patient: [name]  Procedure(s) Performed: Procedure(s) (LRB): TOTAL HIP ARTHROPLASTY (Right)  Patient Location: PACU  Anesthesia Type: Spinal  Level of Consciousness: sedated, patient cooperative and responds to stimulaton  Airway & Oxygen Therapy: Patient Spontanous Breathing and Patient connected to face mask oxgen  Post-op Assessment: Report given to PACU RN and Post -op Vital signs reviewed and stable  Post vital signs: Reviewed and stable  Complications: No apparent anesthesia complications

## 2012-07-09 NOTE — H&P (View-Only) (Signed)
[name]  DOB: 07/27/1931 Undefined / Language: English / Race: White Female  Date of Admission:  07/09/2012  Chief Complaint:  Right Hip Pain  History of Present Illness The patient is a 78 year old female who comes in for a preoperative History and Physical. The patient is scheduled for a right total hip arthroplasty to be performed by Dr. Frank V. Aluisio, MD at [facility] on 07/09/2012. The patient is a 78 year old female who presents for their right hip pain and trochanteric bursitis. They are 2 month(s) out from injection with Steve, PAC. Symptoms reported today include: pain. Note for "Follow-up Hip": She states the injection did not help at all. She is still having a lot of pain. She said she saw Dr. Hirsh for her back recently and he told her he would send over her notes and films, but we have not received anything. Unfortunately the trochanteric injection did not help her. She has significant groin pain, lateral hip pain and pain radiating into the her knee. She has had that knee replaced and has done great with that. Unfortunately the hip is getting progressively worse. 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 It is hurting day and night. It is limiting her activities. She is ready to proceed with hip repalcement. They have been treated conservatively in the past for the above stated problem and despite conservative measures, they continue to have progressive pain and severe functional limitations and dysfunction. They have failed non-operative management. It is felt that they would benefit from undergoing total joint replacement. Risks and benefits of the procedure have been discussed with the patient and they elect to proceed with surgery. There are no active contraindications to surgery such as ongoing infection or rapidly progressive neurological  disease.     Problem List Osteoarthritis, hip (715.35)   Allergies No Known Drug Allergies   Family History Cerebrovascular Accident. brother Diabetes Mellitus. brother Osteoarthritis. sister   Social History Exercise. Exercises daily; does other Illicit drug use. no Living situation. live alone Drug/Alcohol Rehab (Previously). no Children. 3 Current work status. retired Drug/Alcohol Rehab (Currently). no Tobacco use. former smoker; smoke(d) less than 1/2 pack(s) per day Marital status. widowed Pain Contract. no Tobacco / smoke exposure. no Alcohol use. never consumed alcohol Post-Surgical Plans. Plan is to go home with daughter. Advance Directives. Living Will and Healthcare POA  Past Surgical History Hysterectomy. complete (non-cancerous) Total Knee Replacement. right Tubal Ligation Appendectomy Cataract Surgery. bilateral Gallbladder Surgery. laporoscopic   Past Medical History Hypercholesterolemia Skin Cancer High blood pressure   Review of Systems General:Not Present- Chills, Fever, Night Sweats, Fatigue, Weight Gain, Weight Loss and Memory Loss. Skin:Not Present- Hives, Itching, Rash, Eczema and Lesions. HEENT:Not Present- Tinnitus, Headache, Double Vision, Visual Loss, Hearing Loss and Dentures. Respiratory:Not Present- Shortness of breath with exertion, Shortness of breath at rest, Allergies, Coughing up blood and Chronic Cough. Cardiovascular:Not Present- Chest Pain, Racing/skipping heartbeats, Difficulty Breathing Lying Down, Murmur, Swelling and Palpitations. Gastrointestinal:Not Present- Bloody Stool, Heartburn, Abdominal Pain, Vomiting, Nausea, Constipation, Diarrhea, Difficulty Swallowing, Jaundice and Loss of appetitie. Female Genitourinary:Not Present- Blood in Urine, Urinary frequency, Weak urinary stream, Discharge, Flank Pain, Incontinence, Painful Urination, Urgency, Urinary Retention and Urinating at  Night. Musculoskeletal:Present- Joint Pain. Not Present- Muscle Weakness, Muscle Pain, Joint Swelling, Back Pain, Morning Stiffness and Spasms. Neurological:Not Present- Tremor, Dizziness, Blackout spells, Paralysis, Difficulty with   balance and Weakness. Psychiatric:Not Present- Insomnia.   Vitals Weight: 130 lb Height: 63 in Body Surface Area: 1.62 m Body Mass Index: 23.03 kg/m Pulse: 64 (Regular) Resp.: 12 (Unlabored) BP: 128/68 (Sitting, Right Arm, Standard)    Physical Exam The physical exam findings are as follows:  Patient is a 78 year old female with conitnued hip pain.   General Mental Status - Alert, cooperative and good historian. General Appearance- pleasant. Not in acute distress. Orientation- Oriented X3. Build & Nutrition- Well nourished and Well developed.   Head and Neck Head- normocephalic, atraumatic . Neck Global Assessment- supple. no bruit auscultated on the right and no bruit auscultated on the left.   Eye Pupil- Bilateral- Regular and Round. Note: wears glasses Motion- Bilateral- EOMI.   ENMT upper and lower dentures  Chest and Lung Exam Auscultation: Breath sounds:- clear at anterior chest wall and - clear at posterior chest wall. Adventitious sounds:- No Adventitious sounds.   Cardiovascular Auscultation:Rhythm- Regular rate and rhythm. Heart Sounds- S1 WNL and S2 WNL. Murmurs & Other Heart Sounds:Auscultation of the heart reveals - No Murmurs.   Abdomen Palpation/Percussion:Tenderness- Abdomen is non-tender to palpation. Rigidity (guarding)- Abdomen is soft. Auscultation:Auscultation of the abdomen reveals - Bowel sounds normal.   Female Genitourinary Not done, not pertinent to present illness  Musculoskeletal She is a well developed female alert and oriented in no apparent distress. Her right hip can be flexed 90 with no internal or external rotation and no abduction. Her right knee  remains 0 to 130 with no tenderness or instability.  RADIOGRAPHS: X-rays from June, AP pelvis and lateral of the right hip, she has severe bone on bone arthritis. Absolutely no joint space left with subchondral cystic formation in the femoral head and acetabulum.  Assessment & Plan Osteoarthritis, hip (715.35) Impression: Right Hip  Note: Patient is for a Right Total Hip Replacement by Dr. Aluisio.  Patinet states that she gets sick with every time with anesthesia in the past. She also states that she has had difficulty getting a foley catheter in the past.  PCP - Dr. C. Alan Ross  Signed electronically by DREW L Keanen Dohse, PA-C  

## 2012-07-09 NOTE — Interval H&P Note (Signed)
History and Physical Interval Note:  07/09/2012 1:27 PM  [name]  has presented today for surgery, with the diagnosis of Osteoarthritis of the Right Hip  The various methods of treatment have been discussed with the patient and family. After consideration of risks, benefits and other options for treatment, the patient has consented to  Procedure(s) (LRB) with comments: TOTAL HIP ARTHROPLASTY (Right) as a surgical intervention .  The patient's history has been reviewed, patient examined, no change in status, stable for surgery.  I have reviewed the patient's chart and labs.  Questions were answered to the patient's satisfaction.     Loanne Drilling

## 2012-07-09 NOTE — Anesthesia Preprocedure Evaluation (Addendum)
Anesthesia Evaluation    History of Anesthesia Complications (+) PONV  Airway       Dental   Pulmonary          Cardiovascular hypertension, Pt. on medications     Neuro/Psych    GI/Hepatic   Endo/Other    Renal/GU      Musculoskeletal   Abdominal   Peds  Hematology   Anesthesia Other Findings   Reproductive/Obstetrics                           Anesthesia Physical Anesthesia Plan  ASA: II  Anesthesia Plan: Spinal   Post-op Pain Management:    Induction:   Airway Management Planned: Simple Face Mask  Additional Equipment:   Intra-op Plan:   Post-operative Plan:   Informed Consent: I have reviewed the patients History and Physical, chart, labs and discussed the procedure including the risks, benefits and alternatives for the proposed anesthesia with the patient or authorized representative who has indicated his/her understanding and acceptance.   Dental advisory given  Plan Discussed with: CRNA and Surgeon  Anesthesia Plan Comments:         Anesthesia Quick Evaluation

## 2012-07-09 NOTE — Anesthesia Procedure Notes (Signed)
Spinal  Patient location during procedure: OR Staffing Anesthesiologist: Tee Richeson Performed by: anesthesiologist  Preanesthetic Checklist Completed: patient identified, site marked, surgical consent, pre-op evaluation, timeout performed, IV checked, risks and benefits discussed and monitors and equipment checked Spinal Block Patient position: sitting Prep: Betadine Patient monitoring: heart rate, continuous pulse ox and blood pressure Approach: right paramedian Location: L2-3 Injection technique: single-shot Needle Needle type: Spinocan  Needle gauge: 22 G Needle length: 9 cm Additional Notes Expiration date of kit checked and confirmed. Patient tolerated procedure well, without complications.     

## 2012-07-09 NOTE — Brief Op Note (Addendum)
07/09/2012  2:54 PM  PATIENT:  [name]  78 y.o. female  PRE-OPERATIVE DIAGNOSIS:  Osteoarthritis of the Right Hip  POST-OPERATIVE DIAGNOSIS:  Osteoarthritis of the Right Hip  PROCEDURE:  Procedure(s) (LRB) with comments: TOTAL HIP ARTHROPLASTY (Right)  SURGEON:  Surgeon(s) and Role:    * Loanne Drilling, MD - Primary  PHYSICIAN ASSISTANT:   ASSISTANTS: Leilani Able, PA-C   ANESTHESIA:   Spinal  EBL:  Total I/O In: 1000 [I.V.:1000] Out: 175 [Urine:75; Blood:100]  BLOOD ADMINISTERED:none  DRAINS: (Medium) Hemovact drain(s) in the right hip with  Suction Open   LOCAL MEDICATIONS USED:  OTHER Exparel 20 ml  COUNTS:  YES  TOURNIQUET:  * No tourniquets in log *  DICTATION: .Other Dictation: Dictation Number [id number]  PLAN OF CARE: Admit to inpatient   PATIENT DISPOSITION:  PACU - hemodynamically stable.

## 2012-07-09 NOTE — Progress Notes (Signed)
Utilization review completed.  

## 2012-07-10 ENCOUNTER — Encounter (HOSPITAL_COMMUNITY): Payer: Self-pay | Admitting: Orthopedic Surgery

## 2012-07-10 DIAGNOSIS — E876 Hypokalemia: Secondary | ICD-10-CM | POA: Diagnosis present

## 2012-07-10 DIAGNOSIS — D62 Acute posthemorrhagic anemia: Secondary | ICD-10-CM | POA: Diagnosis not present

## 2012-07-10 DIAGNOSIS — E871 Hypo-osmolality and hyponatremia: Secondary | ICD-10-CM | POA: Diagnosis present

## 2012-07-10 LAB — CBC
MCV: 89.8 fL (ref 78.0–100.0)
Platelets: 281 10*3/uL (ref 150–400)
RBC: 2.65 MIL/uL — ABNORMAL LOW (ref 3.87–5.11)
RDW: 13 % (ref 11.5–15.5)
WBC: 6.4 10*3/uL (ref 4.0–10.5)

## 2012-07-10 LAB — BASIC METABOLIC PANEL
CO2: 27 mEq/L (ref 19–32)
Chloride: 94 mEq/L — ABNORMAL LOW (ref 96–112)
GFR calc Af Amer: >90 mL/min (ref >90)
Potassium: 2.7 mEq/L — CL (ref 3.5–5.1)
Sodium: 131 mEq/L — ABNORMAL LOW (ref 135–145)

## 2012-07-10 MED ORDER — POLYSACCHARIDE IRON COMPLEX 150 MG PO CAPS
150.0000 mg | ORAL_CAPSULE | Freq: Every day | ORAL | Status: DC
  Administered 2012-07-10 – 2012-07-13 (×4): 150 mg via ORAL
  Filled 2012-07-10 (×4): qty 1

## 2012-07-10 MED ORDER — POTASSIUM CHLORIDE CRYS ER 20 MEQ PO TBCR
40.0000 meq | EXTENDED_RELEASE_TABLET | Freq: Two times a day (BID) | ORAL | Status: AC
  Administered 2012-07-10 (×2): 40 meq via ORAL
  Filled 2012-07-10 (×2): qty 2

## 2012-07-10 NOTE — Progress Notes (Signed)
Physical Therapy Treatment Patient Details Name: [name] MRN: [id number] DOB: August 29, 1931 Today's Date: 07/10/2012 Time: 4098-1191 PT Time Calculation (min): 23 min  PT Assessment / Plan / Recommendation Comments on Treatment Session       Follow Up Recommendations  Home health PT     Does the patient have the potential to tolerate intense rehabilitation     Barriers to Discharge        Equipment Recommendations  None recommended by PT    Recommendations for Other Services OT consult  Frequency 7X/week   Plan Discharge plan remains appropriate    Precautions / Restrictions Precautions Precautions: Posterior Hip Precaution Comments: pt recalls 1/3 THP without cues Restrictions Weight Bearing Restrictions: No Other Position/Activity Restrictions: WBAT   Pertinent Vitals/Pain 4/10    Mobility  Bed Mobility Bed Mobility: Sit to Supine Sit to Supine: 4: Min assist;3: Mod assist Details for Bed Mobility Assistance: cues for sequence, THP and use of UEs to self assist Transfers Transfers: Sit to Stand;Stand to Sit Sit to Stand: 4: Min assist Stand to Sit: 4: Min assist Details for Transfer Assistance: cues for use of UEs and for LE management Ambulation/Gait Ambulation/Gait Assistance: 4: Min assist;3: Mod assist Ambulation Distance (Feet): 74 Feet Assistive device: Rolling walker Ambulation/Gait Assistance Details: cues for posture, sequence, position from RW, stride length and ER on R Gait Pattern: Step-to pattern    Exercises     PT Diagnosis:    PT Problem List:   PT Treatment Interventions:     PT Goals Acute Rehab PT Goals PT Goal Formulation: With patient Time For Goal Achievement: 07/17/12 Potential to Achieve Goals: Good Pt will go Supine/Side to Sit: with supervision PT Goal: Supine/Side to Sit - Progress: Goal set today Pt will go Sit to Supine/Side: with supervision PT Goal: Sit to Supine/Side - Progress: Goal set today Pt will go Sit  to Stand: with supervision PT Goal: Sit to Stand - Progress: Progressing toward goal Pt will go Stand to Sit: with supervision PT Goal: Stand to Sit - Progress: Progressing toward goal Pt will Ambulate: 51 - 150 feet;with supervision;with rolling walker PT Goal: Ambulate - Progress: Progressing toward goal Pt will Go Up / Down Stairs: 1-2 stairs;with min assist;with least restrictive assistive device PT Goal: Up/Down Stairs - Progress: Goal set today  Visit Information  Last PT Received On: 07/10/12 Assistance Needed: +1    Subjective Data  Patient Stated Goal: Resume previous lifestyle with decreased pain   Cognition  Overall Cognitive Status: Appears within functional limits for tasks assessed/performed Arousal/Alertness: Awake/alert Orientation Level: Appears intact for tasks assessed Behavior During Session: [facility] for tasks performed    Balance     End of Session     GP     Amante Fomby 07/10/2012, 3:47 PM

## 2012-07-10 NOTE — Op Note (Signed)
[name], [name]            ACCOUNT NO.:  [id number]  MEDICAL RECORD NO.:  [id number]  LOCATION:  1616                         FACILITY:  [facility]  PHYSICIAN:  Ollen Gross, M.D.    DATE OF BIRTH:  1931-03-29  DATE OF PROCEDURE:  07/09/2012 DATE OF DISCHARGE:                              OPERATIVE REPORT   PREOPERATIVE DIAGNOSIS:  Osteoarthritis, right hip.  POSTOPERATIVE DIAGNOSIS:  Osteoarthritis, right hip.  PROCEDURE:  Right total hip arthroplasty.  SURGEON:  Ollen Gross, MD  ASSISTANT:  Jaquelyn Bitter. Chabon, PA  ANESTHESIA: Spinal  ESTIMATED BLOOD LOSS:  300.  DRAINS:  Hemovac x1.  COMPLICATIONS:  None.  CONDITION:  Stable to recovery.  BRIEF CLINICAL NOTE:  [name] is a 78 year old female with severe end-stage arthritis of her right hip with progressively worsening pain and dysfunction.  She has bone-on-bone arthritis.  She has had extensive nonoperative management including analgesics and protected weightbearing.  She had intra-articular injection without much benefit. She presents now for right total hip arthroplasty.  PROCEDURE IN DETAIL:  After successful administration of general anesthetic, the patient was placed in the left lateral decubitus position with the right side up and held with the hip positioner.  Right lower extremity was isolated from her perineum with plastic drapes and prepped and draped in the usual sterile fashion.  Short posterolateral incision was made with a 10 blade through subcutaneous tissue to the level of the fascia lata which was incised in line with the skin incision.  The sciatic nerve was palpated and protected and short rotators and capsule were isolated off the femur.  Hip was dislocated and the center of femoral head was marked.  A trial prosthesis was placed such that the center of the trial head corresponds to the center of the native femoral head.  Osteotomy line was marked on the femoral neck and osteotomy  made with an oscillating saw.  Femoral head was removed and femoral preparation initiated.  The starter reamer was passed into the canal and the canal was thoroughly irrigated.  The initiator broach was then passed into the proximal femur, getting as lateral as possible.  I then started broaching for the Corail hip starting at a size 8 coursing up to 10. Size 10 has excellent torsional and rotational stability.  The 10 broach was left intact.  Femur was retracted anteriorly to gain acetabular exposure.  Acetabular retractors were placed and labrum and osteophytes removed.  Acetabular reaming starts at 45 mm coursing increments of 2.  A 49 mm and a 50 mm pinnacle acetabular shell was placed in anatomic position.  Dome screws were not needed.  The permanent 32 mm neutral +4 marathon liner was placed.  The femoral trial was placed at the broach.  Marney Doctor was intact and the standard femoral neck Corail was placed with a 32+ 1 head.  Hip was reduced and there was a tiny bit of soft tissue laxity, so we went to 32+ 5 which corrected the laxity.  She has fantastic stability with full extension and full external rotation, 70 degrees flexion, 40 degrees adduction, and 90 degrees of internal rotation and 90 degrees of flexion and 70 degrees of internal rotation.  By placing the right leg on top of the left, it felt as though leg lengths were equal.  Hip was dislocated and trials and broach were removed.  The permanent size 10 standard neck Corail hip was impacted into the femur with excellent torsional and axial stability.  A 32+ 5 metal head was placed and the hip was reduced in the same stability parameters.  The wound was copiously irrigated with saline solution and then a short rotator in capsule, reattached to the femur through drill holes with Ethibond suture.  Exparel 20 mL mixed with 50 mL of saline was injected into the short rotators, the capsule, the gluteal muscles and subcu tissue.   Fascia lata was then closed over Hemovac drain with a running #1 V-Loc suture.  Subcu was closed with interrupted 2-0 Vicryl and subcuticular running 4-0 Monocryl.  The drains hooked to suction.  Incision cleaned and dried.  Steri-Strips and a bulky sterile dressing applied.  She was then placed into a knee immobilizer, awakened, and transported to recovery in stable condition.  Please note that a surgical assistant was a medical necessity for this procedure in order to perform it in a safe and expeditious manner. Surgical assistant was necessary for retraction of vital neurovascular structures, proper positioning of the leg to allow for anatomic placement of the prosthesis and for assistance and primary closure of the wound.     Ollen Gross, M.D.     FA/MEDQ  D:  07/09/2012  T:  07/10/2012  Job:  [id number]

## 2012-07-10 NOTE — Progress Notes (Signed)
   Subjective: 1 Day Post-Op Procedure(s) (LRB): TOTAL HIP ARTHROPLASTY (Right) Patient reports no pain in the hip this morning.  Family in room.   Patient seen in rounds with Dr. Lequita Halt. Patient is well, and has had no acute complaints or problems We will start therapy today.  Plan is to go Home after hospital stay.  Objective: Vital signs in last 24 hours: Temp:  [97.5 F (36.4 C)-99.3 F (37.4 C)] 99.3 F (37.4 C) (10/15 0554) Pulse Rate:  [80-104] 88  (10/15 0554) Resp:  [12-18] 14  (10/15 0554) BP: (100-157)/(50-79) 107/61 mmHg (10/15 0554) SpO2:  [95 %-100 %] 100 % (10/15 0554) Weight:  [57.607 kg (127 lb)] 57.607 kg (127 lb) (10/14 1628)  Intake/Output from previous day:  Intake/Output Summary (Last 24 hours) at 07/10/12 0824 Last data filed at 07/10/12 0611  Gross per 24 hour  Intake 4222.5 ml  Output   2000 ml  Net 2222.5 ml    Intake/Output this shift: UOP 275 since MN  Labs:  [facility] 07/10/12 0409  HGB 8.3*    Basename 07/10/12 0409  WBC 6.4  RBC 2.65*  HCT 23.8*  PLT 281    Basename 07/10/12 0409  NA 131*  K 2.7*  CL 94*  CO2 27  BUN 10  CREATININE 0.65  GLUCOSE 126*  CALCIUM 7.7*   No results found for this basename: LABPT:2,INR:2 in the last 72 hours  EXAM General - Patient is Alert, Appropriate and Oriented Extremity - Neurovascular intact Sensation intact distally Dorsiflexion/Plantar flexion intact Dressing - dressing C/D/I Motor Function - intact, moving foot and toes well on exam.  Hemovac pulled without difficulty.  Past Medical History  Diagnosis Date  . PONV (postoperative nausea and vomiting)   . Hypertension   . Arthritis   . History of blood transfusion     Assessment/Plan: 1 Day Post-Op Procedure(s) (LRB): TOTAL HIP ARTHROPLASTY (Right) Principal Problem:  *OA (osteoarthritis) of hip Active Problems:  Postop Acute blood loss anemia  Postop Hyponatremia  Postop Hypokalemia  Estimated Body mass index is  22.50 kg/(m^2) as calculated from the following:   Height as of this encounter: 5\' 3" (1.6 m).   Weight as of this encounter: 127 lb(57.607 kg). Advance diet Up with therapy Plan for discharge tomorrow or the next day depending upon progress Discharge home with home health  DVT Prophylaxis - Xarelto, Aspirin 81 mg on hold. Weight Bearing As Tolerated right Leg D/C Knee Immobilizer Hemovac Pulled Begin Therapy Hip Preacutions No vaccines.  PERKINS, ALEXZANDREW 07/10/2012, 8:24 AM

## 2012-07-10 NOTE — Anesthesia Postprocedure Evaluation (Signed)
  Anesthesia Post-op Note  Patient: [name]  Procedure(s) Performed: Procedure(s) (LRB): TOTAL HIP ARTHROPLASTY (Right)  Patient Location: PACU  Anesthesia Type: Spinal  Level of Consciousness: awake and alert   Airway and Oxygen Therapy: Patient Spontanous Breathing  Post-op Pain: mild  Post-op Assessment: Post-op Vital signs reviewed, Patient's Cardiovascular Status Stable, Respiratory Function Stable, Patent Airway and No signs of Nausea or vomiting  Post-op Vital Signs: stable  Complications: No apparent anesthesia complications

## 2012-07-11 LAB — CBC
HCT: 23.9 % — ABNORMAL LOW (ref 36.0–46.0)
Hemoglobin: 8.2 g/dL — ABNORMAL LOW (ref 12.0–15.0)
MCH: 31.1 pg (ref 26.0–34.0)
RBC: 2.64 MIL/uL — ABNORMAL LOW (ref 3.87–5.11)

## 2012-07-11 LAB — BASIC METABOLIC PANEL
BUN: 10 mg/dL (ref 6–23)
CO2: 27 mEq/L (ref 19–32)
Glucose, Bld: 118 mg/dL — ABNORMAL HIGH (ref 70–99)
Potassium: 3.4 mEq/L — ABNORMAL LOW (ref 3.5–5.1)
Sodium: 128 mEq/L — ABNORMAL LOW (ref 135–145)

## 2012-07-11 NOTE — Clinical Social Work Placement (Signed)
     Clinical Social Work Department CLINICAL SOCIAL WORK PLACEMENT NOTE 07/11/2012  Patient:  [name], [name]  Account Number:  [id number] Admit date:  07/09/2012  Clinical Social Worker:  Doree Albee  Date/time:  07/11/2012 12:00 M  Clinical Social Work is seeking post-discharge placement for this patient at the following level of care:   SKILLED NURSING   (*CSW will update this form in Epic as items are completed)   07/11/2012  Patient/family provided with [facility] Department of Clinical Social Works list of facilities offering this level of care within the geographic area requested by the patient (or if unable, by the patients family).  07/11/2012  Patient/family informed of their freedom to choose among providers that offer the needed level of care, that participate in Medicare, Medicaid or managed care program needed by the patient, have an available bed and are willing to accept the patient.  07/11/2012  Patient/family informed of [facility] ownership interest in [facility], as well as of the fact that they are under no obligation to receive care at this facility.  PASARR submitted to EDS on 07/11/2012 PASARR number received from EDS on   FL2 transmitted to all facilities in geographic area requested by pt/family on  07/11/2012 FL2 transmitted to all facilities within larger geographic area on   Patient informed that his/her managed care company has contracts with or will negotiate with  certain facilities, including the following:     Patient/family informed of bed offers received:   Patient chooses bed at  Physician recommends and patient chooses bed at    Patient to be transferred to  on   Patient to be transferred to facility by   The following physician request were entered in Epic:   Additional Comments:

## 2012-07-11 NOTE — Progress Notes (Signed)
Physical Therapy Treatment Patient Details Name: [name] MRN: [id number] DOB: 04/26/1931 Today's Date: 07/11/2012 Time: 1478-2956 PT Time Calculation (min): 18 min  PT Assessment / Plan / Recommendation Comments on Treatment Session  Pt limited by pain in L knee - pain most limiting with move sit<>stand    Follow Up Recommendations  Post acute inpatient     Does the patient have the potential to tolerate intense rehabilitation  No, Recommend SNF  Barriers to Discharge        Equipment Recommendations  None recommended by PT    Recommendations for Other Services OT consult  Frequency 7X/week   Plan Discharge plan needs to be updated    Precautions / Restrictions Precautions Precautions: Posterior Hip Precaution Comments: pt recalls 1/3 THP without cues Restrictions Weight Bearing Restrictions: No Other Position/Activity Restrictions: WBAT   Pertinent Vitals/Pain 8/10 L knee     Mobility  Bed Mobility Bed Mobility: Sit to Supine Sit to Supine: 1: +2 Total assist Sit to Supine: Patient Percentage: 50% Transfers Transfers: Sit to Stand;Stand to Sit Sit to Stand: 3: Mod assist Stand to Sit: 1: +2 Total assist;With upper extremity assist Stand to Sit: Patient Percentage: 60% Details for Transfer Assistance: cues for use of UEs and for LE management; pt ltd by L knee pain with flexion Ambulation/Gait Ambulation/Gait Assistance: 3: Mod assist Ambulation Distance (Feet): 6 Feet Assistive device: Rolling walker Ambulation/Gait Assistance Details: cues for sequence, posture, position from RW Gait Pattern: Step-to pattern    Exercises     PT Diagnosis:    PT Problem List:   PT Treatment Interventions:     PT Goals Acute Rehab PT Goals PT Goal Formulation: With patient Time For Goal Achievement: 07/17/12 Potential to Achieve Goals: Good Pt will go Supine/Side to Sit: with supervision PT Goal: Supine/Side to Sit - Progress: Progressing toward goal Pt  will go Sit to Supine/Side: with supervision PT Goal: Sit to Supine/Side - Progress: Progressing toward goal Pt will go Sit to Stand: with supervision PT Goal: Sit to Stand - Progress: Progressing toward goal Pt will go Stand to Sit: with supervision PT Goal: Stand to Sit - Progress: Progressing toward goal Pt will Ambulate: 51 - 150 feet;with supervision;with rolling walker PT Goal: Ambulate - Progress: Progressing toward goal  Visit Information  Last PT Received On: 07/11/12 Assistance Needed: +1    Subjective Data  Subjective: My knee hurts Patient Stated Goal: Resume previous lifestyle with decreased pain   Cognition  Overall Cognitive Status: Appears within functional limits for tasks assessed/performed Arousal/Alertness: Awake/alert Orientation Level: Appears intact for tasks assessed Behavior During Session: [facility] for tasks performed    Balance     End of Session PT - End of Session Equipment Utilized During Treatment: Gait belt Activity Tolerance: Patient limited by pain Patient left: in bed;with call bell/phone within reach;with family/visitor present Nurse Communication: Mobility status   GP     [name] 07/11/2012, 4:38 PM

## 2012-07-11 NOTE — Progress Notes (Addendum)
   Subjective: 2 Days Post-Op Procedure(s) (LRB): TOTAL HIP ARTHROPLASTY (Right) Patient reports pain as mild.   Patient seen in rounds with Dr. Lequita Halt. Patient is well, and has had no acute complaints or problems Plan is to go Home after hospital stay. She may need SNF so we will get the social worker to look into that also.  Objective: Vital signs in last 24 hours: Temp:  [99.2 F (37.3 C)-100.5 F (38.1 C)] 99.9 F (37.7 C) (10/16 1034) Pulse Rate:  [83-88] 88  (10/16 0911) Resp:  [14-18] 18  (10/16 1118) BP: (106-146)/(63-70) 113/70 mmHg (10/16 0911) SpO2:  [93 %-99 %] 97 % (10/16 0911)  Intake/Output from previous day:  Intake/Output Summary (Last 24 hours) at 07/11/12 1202 Last data filed at 07/11/12 0950  Gross per 24 hour  Intake 1868.75 ml  Output   1025 ml  Net 843.75 ml    Intake/Output this shift: Total I/O In: 240 [P.O.:240] Out: 150 [Urine:150]  Labs:  [facility] 07/11/12 0345 07/10/12 0409  HGB 8.2* 8.3*    Basename 07/11/12 0345 07/10/12 0409  WBC 11.4* 6.4  RBC 2.64* 2.65*  HCT 23.9* 23.8*  PLT 286 281    Basename 07/11/12 0345 07/10/12 0409  NA 128* 131*  K 3.4* 2.7*  CL 93* 94*  CO2 27 27  BUN 10 10  CREATININE 0.74 0.65  GLUCOSE 118* 126*  CALCIUM 8.0* 7.7*   No results found for this basename: LABPT:2,INR:2 in the last 72 hours  EXAM General - Patient is Alert, Appropriate and Oriented Extremity - Neurovascular intact Sensation intact distally Dorsiflexion/Plantar flexion intact No cellulitis present Dressing/Incision - clean, dry, no drainage, healing Motor Function - intact, moving foot and toes well on exam.   Past Medical History  Diagnosis Date  . PONV (postoperative nausea and vomiting)   . Hypertension   . Arthritis   . History of blood transfusion     Assessment/Plan: 2 Days Post-Op Procedure(s) (LRB): TOTAL HIP ARTHROPLASTY (Right) Principal Problem:  *OA (osteoarthritis) of hip Active Problems:  Postop  Acute blood loss anemia  Postop Hyponatremia  Postop Hypokalemia  Estimated Body mass index is 22.50 kg/(m^2) as calculated from the following:   Height as of this encounter: 5\' 3" (1.6 m).   Weight as of this encounter: 127 lb(57.607 kg). Up with therapy Plan for discharge tomorrow Discharge home with home health  DVT Prophylaxis - Xarelto, Aspirin 81 mg on hold.  Weight Bearing As Tolerated right Leg  Cameren Odwyer 07/11/2012, 12:02 PM

## 2012-07-11 NOTE — Progress Notes (Signed)
OT Cancellation Note  Patient Details Name: [name] MRN: [id number] DOB: 1931-03-29   Cancelled Treatment:    Reason Eval/Treat Not Completed: Pain limiting ability to participate. Pt just finished PT session and reporting pain. States she needs to rest. Per pt/family just had pain meds.  Lennox Laity [phone] 07/11/2012, 11:52 AM

## 2012-07-11 NOTE — Clinical Social Work Psychosocial (Signed)
     Clinical Social Work Department BRIEF PSYCHOSOCIAL ASSESSMENT 07/11/2012  Patient:  [name], [name]     Account Number:  [id number]     Admit date:  07/09/2012  Clinical Social Worker:  Doree Albee  Date/Time:  07/11/2012 12:00 M  Referred by:  RN  Date Referred:  07/11/2012 Referred for  SNF Placement   Other Referral:   Interview type:  Family Other interview type:   pt daughter    PSYCHOSOCIAL DATA Living Status:  ALONE Admitted from facility:   Level of care:   Primary support name:  [name] Primary support relationship to patient:  CHILD, ADULT Degree of support available:    CURRENT CONCERNS Current Concerns  Post-Acute Placement   Other Concerns:    SOCIAL WORK ASSESSMENT / PLAN CSW met with pt and pt daughter at bedside to complete psychosocial assessment and assist with pt dc plans. Pt currently asleep, and unable to arouse due to pt recently reciving morphine per pt RN.    CSW spoke with pt daughter regarding skilled nursing facility placement for short term rehab. pt daughter stated that pt and pt family have been discussing [facility] as patient MD had recommended the facility. Pt family also interested in other [place] skilled nursing and possibly [place] as pt daughter in Social worker works for [employer] in [place].   Assessment/plan status:  Psychosocial Support/Ongoing Assessment of Needs Other assessment/ plan:   and discharge plan   Information/referral to community resources:   skilled nursing facility list    PATIENTS/FAMILYS RESPONSE TO PLAN OF CARE: Pt daughter thanked csw for concern and support. Pt daughter is motivated for pt to dsicharge to short term rehab in hopes of returning home when medically stable.

## 2012-07-11 NOTE — Care Management Note (Signed)
    Page 1 of 2   07/11/2012     5:20:30 PM   CARE MANAGEMENT NOTE 07/11/2012  Patient:  MISHEL, RUISE   Account Number:  [id number]  Date Initiated:  07/11/2012  Documentation initiated by:  Colleen Can  Subjective/Objective Assessment:   dx osteoarthritis right hip; total hip replacemnt -     Action/Plan:   CM spoke with patient and family. Plans are for SNF rehab   Anticipated DC Date:  07/12/2012   Anticipated DC Plan:  SKILLED NURSING FACILITY  In-house referral  Clinical Social Worker      DC Planning Services  CM consult      [facility] Choice  NA   Choice offered to / List presented to:  NA   DME arranged  NA      DME agency  NA     HH arranged  NA      HH agency  NA   Status of service:  Completed, signed off Medicare Important Message given?  NA - LOS <3 / Initial given by admissions (If response is "NO", the following Medicare IM given date fields will be blank) Date Medicare IM given:   Date Additional Medicare IM given:    Discharge Disposition:    Per UR Regulation:    If discussed at Long Length of Stay Meetings, dates discussed:    Comments:

## 2012-07-11 NOTE — Progress Notes (Signed)
Physical Therapy Treatment Patient Details Name: [name] MRN: [id number] DOB: 1930/12/18 Today's Date: 07/11/2012 Time: 4098-1191 PT Time Calculation (min): 29 min  PT Assessment / Plan / Recommendation Comments on Treatment Session  Pt limited by pain in L knee - pain most limiting with move sit<>stand    Follow Up Recommendations  Post acute inpatient     Does the patient have the potential to tolerate intense rehabilitation  No, Recommend SNF  Barriers to Discharge        Equipment Recommendations  None recommended by PT    Recommendations for Other Services OT consult  Frequency 7X/week   Plan Discharge plan needs to be updated    Precautions / Restrictions Precautions Precautions: Posterior Hip Precaution Comments: pt recalls 1/3 THP without cues Restrictions Weight Bearing Restrictions: No Other Position/Activity Restrictions: WBAT   Pertinent Vitals/Pain 6/10 R hip; 8/10 L knee; RN aware, pt premedicated, ice packs provided for hip and knee    Mobility  Bed Mobility Bed Mobility: Supine to Sit Supine to Sit: 4: Min assist;3: Mod assist Details for Bed Mobility Assistance: cues for sequence, THP and use of UEs to self assist Transfers Transfers: Sit to Stand;Stand to Sit Sit to Stand: 3: Mod assist Stand to Sit: 3: Mod assist Details for Transfer Assistance: cues for use of UEs and for LE management Ambulation/Gait Ambulation/Gait Assistance: 4: Min assist;3: Mod assist Ambulation Distance (Feet): 31 Feet Assistive device: Rolling walker Ambulation/Gait Assistance Details: min cues for posture, ER on R and position from RW Gait Pattern: Step-to pattern    Exercises Total Joint Exercises Ankle Circles/Pumps: AROM;10 reps;Supine;Both Quad Sets: AROM;Both;10 reps;Supine Heel Slides: AAROM;Right;Supine;15 reps Hip ABduction/ADduction: AAROM;Supine;Right;15 reps   PT Diagnosis:    PT Problem List:   PT Treatment Interventions:     PT  Goals Acute Rehab PT Goals PT Goal Formulation: With patient Time For Goal Achievement: 07/17/12 Potential to Achieve Goals: Good Pt will go Supine/Side to Sit: with supervision PT Goal: Supine/Side to Sit - Progress: Progressing toward goal Pt will go Sit to Supine/Side: with supervision PT Goal: Sit to Supine/Side - Progress: Progressing toward goal Pt will go Sit to Stand: with supervision PT Goal: Sit to Stand - Progress: Progressing toward goal Pt will go Stand to Sit: with supervision PT Goal: Stand to Sit - Progress: Progressing toward goal Pt will Ambulate: 51 - 150 feet;with supervision;with rolling walker PT Goal: Ambulate - Progress: Progressing toward goal  Visit Information  Last PT Received On: 07/11/12 Assistance Needed: +1    Subjective Data  Subjective: I can't beleive my other knee is hurting more than my hip Patient Stated Goal: Resume previous lifestyle with decreased pain   Cognition  Overall Cognitive Status: Appears within functional limits for tasks assessed/performed Arousal/Alertness: Awake/alert Orientation Level: Appears intact for tasks assessed Behavior During Session: [facility] for tasks performed    Balance     End of Session PT - End of Session Equipment Utilized During Treatment: Gait belt Activity Tolerance: Patient limited by pain Patient left: in chair;with call bell/phone within reach;with family/visitor present Nurse Communication: Mobility status;Patient requests pain meds   GP     [name] 07/11/2012, 12:18 PM

## 2012-07-12 ENCOUNTER — Inpatient Hospital Stay (HOSPITAL_COMMUNITY): Payer: Medicare Other

## 2012-07-12 DIAGNOSIS — R4182 Altered mental status, unspecified: Secondary | ICD-10-CM | POA: Diagnosis not present

## 2012-07-12 LAB — CBC
Hemoglobin: 7.5 g/dL — ABNORMAL LOW (ref 12.0–15.0)
MCH: 32.2 pg (ref 26.0–34.0)
MCHC: 35.7 g/dL (ref 30.0–36.0)
MCV: 90.1 fL (ref 78.0–100.0)
RBC: 2.33 MIL/uL — ABNORMAL LOW (ref 3.87–5.11)

## 2012-07-12 LAB — BASIC METABOLIC PANEL
BUN: 10 mg/dL (ref 6–23)
CO2: 25 mEq/L (ref 19–32)
Calcium: 7.8 mg/dL — ABNORMAL LOW (ref 8.4–10.5)
Creatinine, Ser: 0.78 mg/dL (ref 0.50–1.10)
Glucose, Bld: 119 mg/dL — ABNORMAL HIGH (ref 70–99)

## 2012-07-12 LAB — URINE MICROSCOPIC-ADD ON

## 2012-07-12 LAB — URINALYSIS, ROUTINE W REFLEX MICROSCOPIC
Bilirubin Urine: NEGATIVE
Glucose, UA: NEGATIVE mg/dL
Protein, ur: 100 mg/dL — AB

## 2012-07-12 MED ORDER — LIDOCAINE HCL (PF) 1 % IJ SOLN
5.0000 mL | Freq: Once | INTRAMUSCULAR | Status: DC
  Filled 2012-07-12: qty 5

## 2012-07-12 MED ORDER — METHYLPREDNISOLONE ACETATE 40 MG/ML IJ SUSP
80.0000 mg | Freq: Once | INTRAMUSCULAR | Status: DC
  Filled 2012-07-12: qty 2

## 2012-07-12 MED ORDER — HYDROCODONE-ACETAMINOPHEN 5-325 MG PO TABS
1.0000 | ORAL_TABLET | ORAL | Status: DC | PRN
  Administered 2012-07-12 (×2): 2 via ORAL
  Filled 2012-07-12 (×2): qty 2

## 2012-07-12 MED ORDER — DEXTROSE 5 % IV SOLN
1.0000 g | INTRAVENOUS | Status: DC
  Filled 2012-07-12: qty 10

## 2012-07-12 MED ORDER — MOXIFLOXACIN HCL 400 MG PO TABS
400.0000 mg | ORAL_TABLET | Freq: Every day | ORAL | Status: DC
  Administered 2012-07-12: 400 mg via ORAL
  Filled 2012-07-12 (×3): qty 1

## 2012-07-12 MED ORDER — MAGIC MOUTHWASH
10.0000 mL | Freq: Three times a day (TID) | ORAL | Status: DC
  Administered 2012-07-12 – 2012-07-13 (×2): 10 mL via ORAL
  Filled 2012-07-12 (×4): qty 10

## 2012-07-12 MED ORDER — AZITHROMYCIN 500 MG PO TABS
500.0000 mg | ORAL_TABLET | Freq: Every day | ORAL | Status: DC
  Administered 2012-07-12: 500 mg via ORAL
  Filled 2012-07-12: qty 1

## 2012-07-12 NOTE — Evaluation (Signed)
Occupational Therapy Evaluation Patient Details Name: [name] MRN: [id number] DOB: 1930-10-19 Today's Date: 07/12/2012 Time: 4098-1191 OT Time Calculation (min): 25 min  OT Assessment / Plan / Recommendation Clinical Impression  Pt is a 78 yo female who presents POD 3 RTHR. Pt also having significant L knee pain. Skilled OT recommended to maximize independence with BADLs to supervision/min A level in prep for d/c to next venue of care.    OT Assessment  Patient needs continued OT Services    Follow Up Recommendations  Skilled nursing facility    Barriers to Discharge Decreased caregiver support    Equipment Recommendations  3 in 1 bedside comode    Recommendations for Other Services    Frequency  Min 1X/week    Precautions / Restrictions Precautions Precautions: Posterior Hip Precaution Comments: Pt able to recall 3/3 hip precautions. Restrictions Weight Bearing Restrictions: No   Pertinent Vitals/Pain Pt reported 7/10 hip pain with mobility. Repositioned for comfort.    ADL  Grooming: Simulated;Set up Where Assessed - Grooming: Unsupported sitting Upper Body Bathing: Simulated;Set up Where Assessed - Upper Body Bathing: Unsupported sitting Lower Body Bathing: Simulated;Moderate assistance Where Assessed - Lower Body Bathing: Supported sit to stand Upper Body Dressing: Simulated;Set up Where Assessed - Upper Body Dressing: Unsupported sitting Lower Body Dressing: Simulated;Maximal assistance Where Assessed - Lower Body Dressing: Supported sit to stand Toilet Transfer: Performed;Moderate assistance Toilet Transfer Method: Stand pivot Toilet Transfer Equipment: Comfort height toilet Toileting - Clothing Manipulation and Hygiene: Performed;Minimal assistance Where Assessed - Toileting Clothing Manipulation and Hygiene: Sit to stand from 3-in-1 or toilet Equipment Used: Rolling walker;Gait belt ADL Comments: Pt fatigues quickly. Asked to return back to bed  following session. Introduced pt to AE. Able to recall all hip precautions. Pt also limited by pain in L knee.    OT Diagnosis: Generalized weakness  OT Problem List: Decreased activity tolerance;Decreased safety awareness;Decreased knowledge of use of DME or AE;Pain OT Treatment Interventions: Self-care/ADL training;Therapeutic activities;DME and/or AE instruction;Patient/family education   OT Goals Acute Rehab OT Goals OT Goal Formulation: With patient Time For Goal Achievement: 07/26/12 Potential to Achieve Goals: Good ADL Goals Pt Will Perform Grooming: with supervision;Standing at sink ADL Goal: Grooming - Progress: Goal set today Pt Will Perform Lower Body Bathing: with min assist;Sit to stand from chair;Sit to stand from bed;with adaptive equipment ADL Goal: Lower Body Bathing - Progress: Goal set today Pt Will Perform Lower Body Dressing: with min assist;Sit to stand from bed;Sit to stand from chair;with adaptive equipment ADL Goal: Lower Body Dressing - Progress: Goal set today Pt Will Transfer to Toilet: with supervision;3-in-1;Raised toilet seat with arms;Ambulation;Stand pivot transfer ADL Goal: Toilet Transfer - Progress: Goal set today Pt Will Perform Toileting - Clothing Manipulation: with min assist;Sitting on 3-in-1 or toilet;Standing ADL Goal: Toileting - Clothing Manipulation - Progress: Goal set today Pt Will Perform Toileting - Hygiene: with min assist;Sit to stand from 3-in-1/toilet ADL Goal: Toileting - Hygiene - Progress: Goal set today  Visit Information  Last OT Received On: 07/12/12 Assistance Needed: +1    Subjective Data  Subjective: I'm going to [place] instead of home. Patient Stated Goal: Not asked.   Prior Functioning     Home Living Lives With: Alone Available Help at Discharge: Family Type of Home: House Home Access: Stairs to enter Secretary/administrator of Steps: 2 Entrance Stairs-Rails: Right Home Layout: One level Home Adaptive  Equipment: Walker - rolling Prior Function Level of Independence: Independent Able to Take Stairs?:  Yes Driving: Yes Vocation: Retired Comments: Pt d/cing to [employer].         Vision/Perception     Cognition  Overall Cognitive Status: Appears within functional limits for tasks assessed/performed Arousal/Alertness: Awake/alert Orientation Level: Appears intact for tasks assessed Behavior During Session: [facility] for tasks performed    Extremity/Trunk Assessment Right Upper Extremity Assessment RUE ROM/Strength/Tone: [facility] for tasks assessed Left Upper Extremity Assessment LUE ROM/Strength/Tone: WFL for tasks assessed     Mobility Bed Mobility Sit to Supine: 3: Mod assist;HOB flat Details for Bed Mobility Assistance: Min cues for use of LLE and UEs to self assist, THP. Physical A needed for both LEs. Transfers Sit to Stand: 3: Mod assist;With upper extremity assist;From chair/3-in-1;With armrests Stand to Sit: 3: Mod assist;With upper extremity assist;With armrests;To chair/3-in-1;To bed Details for Transfer Assistance: mod cues for hand placement and LE management. Pt required physical A to rise, stabilize self and control descent.     Shoulder Instructions     Exercise     Balance     End of Session OT - End of Session Equipment Utilized During Treatment: Gait belt Activity Tolerance: Patient limited by fatigue Patient left: in bed;with call bell/phone within reach;with family/visitor present  GO     Norely Schlick A OTR/L [phone] 07/12/2012, 12:20 PM

## 2012-07-12 NOTE — Progress Notes (Signed)
Physical Therapy Treatment Patient Details Name: [name] MRN: [id number] DOB: Aug 03, 1931 Today's Date: 07/12/2012 Time: 7829-5621 PT Time Calculation (min): 30 min  PT Assessment / Plan / Recommendation Comments on Treatment Session       Follow Up Recommendations  Post acute inpatient     Does the patient have the potential to tolerate intense rehabilitation  No, Recommend SNF  Barriers to Discharge        Equipment Recommendations  3 in 1 bedside comode    Recommendations for Other Services OT consult  Frequency 7X/week   Plan Discharge plan needs to be updated    Precautions / Restrictions Precautions Precautions: Posterior Hip Precaution Comments: Pt able to recall 3/3 hip precautions. Restrictions Weight Bearing Restrictions: No Other Position/Activity Restrictions: WBAT   Pertinent Vitals/Pain 5/10; premedicated    Mobility  Bed Mobility Bed Mobility: Supine to Sit Supine to Sit: 3: Mod assist Sit to Supine: 3: Mod assist;HOB flat Details for Bed Mobility Assistance: Min cues for use of LLE and UEs to self assist, THP. Physical A needed for both LEs. Transfers Transfers: Sit to Stand;Stand to Sit Sit to Stand: 3: Mod assist;With upper extremity assist;From chair/3-in-1;With armrests Stand to Sit: 3: Mod assist;With upper extremity assist;With armrests;To chair/3-in-1;To bed Stand to Sit: Patient Percentage: 60% Details for Transfer Assistance: mod cues for hand placement and LE management. Pt required physical A to rise, stabilize self and control descent. Ambulation/Gait Ambulation/Gait Assistance: 3: Mod assist Ambulation Distance (Feet): 26 Feet Assistive device: Rolling walker Ambulation/Gait Assistance Details: cues for posture, sequence, and position from RW Gait Pattern: Step-to pattern    Exercises Total Joint Exercises Ankle Circles/Pumps: AROM;10 reps;Supine;Both Quad Sets: AROM;Both;Supine;15 reps Gluteal Sets: AROM;Both;10  reps;Supine Heel Slides: AAROM;Right;Supine;15 reps Hip ABduction/ADduction: AAROM;Supine;Right;15 reps   PT Diagnosis:    PT Problem List:   PT Treatment Interventions:     PT Goals Acute Rehab PT Goals PT Goal Formulation: With patient Time For Goal Achievement: 07/17/12 Potential to Achieve Goals: Good Pt will go Supine/Side to Sit: with supervision PT Goal: Supine/Side to Sit - Progress: Progressing toward goal Pt will go Sit to Supine/Side: with supervision PT Goal: Sit to Supine/Side - Progress: Progressing toward goal Pt will go Sit to Stand: with supervision PT Goal: Sit to Stand - Progress: Progressing toward goal Pt will go Stand to Sit: with supervision PT Goal: Stand to Sit - Progress: Progressing toward goal Pt will Ambulate: 51 - 150 feet;with supervision;with rolling walker PT Goal: Ambulate - Progress: Progressing toward goal Pt will Go Up / Down Stairs: 1-2 stairs;with min assist;with least restrictive assistive device PT Goal: Up/Down Stairs - Progress: Progressing toward goal  Visit Information  Last PT Received On: 07/12/12 Assistance Needed: +1    Subjective Data  Subjective: Dr Despina Hick drained my knee and its feeling some better Patient Stated Goal: Resume previous lifestyle with decreased pain   Cognition  Overall Cognitive Status: Appears within functional limits for tasks assessed/performed Arousal/Alertness: Awake/alert Orientation Level: Appears intact for tasks assessed Behavior During Session: [facility] for tasks performed    Balance     End of Session PT - End of Session Equipment Utilized During Treatment: Gait belt Activity Tolerance: Patient limited by pain;Patient limited by fatigue Patient left: in chair;with call bell/phone within reach;with family/visitor present Nurse Communication: Mobility status   GP     Maximilien Hayashi 07/12/2012, 12:48 PM

## 2012-07-12 NOTE — Progress Notes (Signed)
Subjective: 3 Days Post-Op Procedure(s) (LRB): TOTAL HIP ARTHROPLASTY (Right) Patient reports pain as mild and moderate.   Patient seen in rounds with Dr. Lequita Halt. Family in room.  Family member stated that the patient became confused on the oral narcotics last night.  She is better today.  Confusion has resolved.  Will change and reduce the oral pain pills.  As per staff:  Patient spiked a fever around 102.0 around 10pm last night. 650mg  of tylenol given PO and temp was rechecked in an hour. No change in temp was noted. Patient was encouraged to use IS and reached about 2000. Temp was rechecked again around 1am- still no change in temp. Patient had yet to void around 1:30 am- bladder scanned around 2am and showed 350cc. I&O cath done at 2:30 with 400cc of urine out. At 4am tylenol suppository was given and temp was rechecked about an hour and a half later. Temp still lingered around 102.0., and patient has voided 50cc. Seen in rounds and will check her urine and CXR.  Will keep today and workup temp.  Patient also has complaints of left knee pain.  Found to have effusion on the left knee.  Recommended bedside aspiration and injection into the left knee.  Bedside Procedure An aspiration and steroid injection was performed at bedside by Dr. Lequita Halt using 1% plain Lidocaine and 80 mg of DepoMedrol.  40 cc's of normal appearing joint fluid was removed. This was well tolerated.   Plan is to go Rehab after hospital stay.  Originally, she wanted to go home but now would like to look into rehab.    Objective: Vital signs in last 24 hours: Temp:  [99.2 F (37.3 C)-102.9 F (39.4 C)] 102.4 F (39.1 C) (10/17 0540) Pulse Rate:  [78-115] 100  (10/17 0540) Resp:  [14-18] 18  (10/17 0540) BP: (106-141)/(58-70) 106/63 mmHg (10/17 0540) SpO2:  [94 %-97 %] 94 % (10/17 0540)  Intake/Output from previous day:  Intake/Output Summary (Last 24 hours) at 07/12/12 0737 Last data filed at 07/12/12 0230  Gross per 24 hour  Intake    720 ml  Output    850 ml  Net   -130 ml    Intake/Output this shift:    Labs:  Basename 07/12/12 0350 07/11/12 0345 07/10/12 0409  HGB 7.5* 8.2* 8.3*    Basename 07/12/12 0350 07/11/12 0345  WBC 13.5* 11.4*  RBC 2.33* 2.64*  HCT 21.0* 23.9*  PLT 257 286    Basename 07/12/12 0350 07/11/12 0345  NA 124* 128*  K 3.2* 3.4*  CL 90* 93*  CO2 25 27  BUN 10 10  CREATININE 0.78 0.74  GLUCOSE 119* 118*  CALCIUM 7.8* 8.0*   No results found for this basename: LABPT:2,INR:2 in the last 72 hours  EXAM General - Patient is Alert, Appropriate and Oriented Extremity - Neurovascular intact Sensation intact distally Dorsiflexion/Plantar flexion intact No cellulitis present Dressing/Incision - clean, dry, no drainage, healing Motor Function - intact, moving foot and toes well on exam.   Left Knee - effusion, crepitus noted, tender to palpation  Past Medical History  Diagnosis Date  . PONV (postoperative nausea and vomiting)   . Hypertension   . Arthritis   . History of blood transfusion     Assessment/Plan: 3 Days Post-Op Procedure(s) (LRB): TOTAL HIP ARTHROPLASTY (Right) Principal Problem:  *OA (osteoarthritis) of hip Active Problems:  Postop Acute blood loss anemia  Postop Hyponatremia  Postop Hypokalemia  Postop Altered mental state  Estimated Body mass index is 22.50 kg/(m^2) as calculated from the following:   Height as of this encounter: 5\' 3" (1.6 m).   Weight as of this encounter: 127 lb(57.607 kg). Up with therapy Plan for discharge tomorrow Discharge to SNF  DVT Prophylaxis - Xarelto, Aspirin 81 mg on hold.  Weight Bearing As Tolerated right and left  Legs  PERKINS, ALEXZANDREW 07/12/2012, 7:37 AM

## 2012-07-12 NOTE — Progress Notes (Signed)
SNF bed confirmed  for patient at [facility] and tentative for tomorrow- CSW covering tomorrow to finalize d/c plans. Reece Levy, MSW, Theresia Majors [phone]

## 2012-07-12 NOTE — Progress Notes (Signed)
Went in to assess Pt's IV, noticed that IV was leaking after flushing. IV d/c'd immediately. Attempted to restart IV x1 and was unsuccessful. IV team consulted. Two IV nurses assessed pt and was unable to get an IV started. Paged on-call MD. Spoke with Dr. Charlann Boxer and order received to cancel IV Rocephin and PO Zithromax. New order received. Also informed Dr. Charlann Boxer of thrush on pt's tongue and pt's report of a sore throat and order for magic mouthwash was also received. Pt and family education done in regards to above changes, opportunity for questions provided.

## 2012-07-12 NOTE — Progress Notes (Signed)
Patient spiked a fever around 102.0 around 10pm last night. 650mg  of tylenol given PO and temp was rechecked in an hour. No change in temp was noted. Patient was encouraged to use IS and reached about 2000. Temp was rechecked again around 1am- still no change in temp. Patient had yet to void around 1:30 am- bladder scanned around 2am and showed 350cc. I&O cath done at 2:30 with 400cc of urine out. At 4am tylenol suppository was given and temp was rechecked about an hour and a half later. Temp still lingered around 102.0., and patient has voided 50cc.  On call PA Diane Kovach paged with orders to get a UA, and notify MD of temp. Will continue to monitor patient.

## 2012-07-13 ENCOUNTER — Encounter (HOSPITAL_COMMUNITY): Payer: Self-pay | Admitting: Family Medicine

## 2012-07-13 DIAGNOSIS — Z96649 Presence of unspecified artificial hip joint: Secondary | ICD-10-CM | POA: Diagnosis not present

## 2012-07-13 DIAGNOSIS — M25569 Pain in unspecified knee: Secondary | ICD-10-CM | POA: Diagnosis not present

## 2012-07-13 DIAGNOSIS — D62 Acute posthemorrhagic anemia: Secondary | ICD-10-CM | POA: Diagnosis not present

## 2012-07-13 DIAGNOSIS — K5900 Constipation, unspecified: Secondary | ICD-10-CM | POA: Diagnosis not present

## 2012-07-13 DIAGNOSIS — M25559 Pain in unspecified hip: Secondary | ICD-10-CM | POA: Diagnosis not present

## 2012-07-13 DIAGNOSIS — E785 Hyperlipidemia, unspecified: Secondary | ICD-10-CM | POA: Diagnosis not present

## 2012-07-13 DIAGNOSIS — I10 Essential (primary) hypertension: Secondary | ICD-10-CM | POA: Diagnosis not present

## 2012-07-13 DIAGNOSIS — R1312 Dysphagia, oropharyngeal phase: Secondary | ICD-10-CM | POA: Diagnosis not present

## 2012-07-13 DIAGNOSIS — Z5189 Encounter for other specified aftercare: Secondary | ICD-10-CM | POA: Diagnosis not present

## 2012-07-13 DIAGNOSIS — S79919A Unspecified injury of unspecified hip, initial encounter: Secondary | ICD-10-CM | POA: Diagnosis not present

## 2012-07-13 DIAGNOSIS — M1990 Unspecified osteoarthritis, unspecified site: Secondary | ICD-10-CM | POA: Diagnosis not present

## 2012-07-13 DIAGNOSIS — E876 Hypokalemia: Secondary | ICD-10-CM

## 2012-07-13 DIAGNOSIS — J690 Pneumonitis due to inhalation of food and vomit: Secondary | ICD-10-CM | POA: Diagnosis not present

## 2012-07-13 DIAGNOSIS — M1610 Unilateral primary osteoarthritis, unspecified hip: Secondary | ICD-10-CM | POA: Diagnosis not present

## 2012-07-13 DIAGNOSIS — E871 Hypo-osmolality and hyponatremia: Secondary | ICD-10-CM

## 2012-07-13 DIAGNOSIS — J189 Pneumonia, unspecified organism: Secondary | ICD-10-CM | POA: Diagnosis not present

## 2012-07-13 LAB — BASIC METABOLIC PANEL
Calcium: 8.7 mg/dL (ref 8.4–10.5)
Chloride: 90 mEq/L — ABNORMAL LOW (ref 96–112)
Creatinine, Ser: 0.64 mg/dL (ref 0.50–1.10)
GFR calc Af Amer: >90 mL/min (ref >90)
Sodium: 128 mEq/L — ABNORMAL LOW (ref 135–145)

## 2012-07-13 LAB — STREP PNEUMONIAE URINARY ANTIGEN: Strep Pneumo Urinary Antigen: NEGATIVE

## 2012-07-13 LAB — LEGIONELLA ANTIGEN, URINE

## 2012-07-13 LAB — CBC
HCT: 21.4 % — ABNORMAL LOW (ref 36.0–46.0)
Platelets: 312 10*3/uL (ref 150–400)
RDW: 12.7 % (ref 11.5–15.5)
WBC: 15.9 10*3/uL — ABNORMAL HIGH (ref 4.0–10.5)

## 2012-07-13 LAB — URINE CULTURE
Colony Count: NO GROWTH
Culture: NO GROWTH

## 2012-07-13 MED ORDER — AMOXICILLIN-POT CLAVULANATE 875-125 MG PO TABS
1.0000 | ORAL_TABLET | Freq: Two times a day (BID) | ORAL | Status: DC
  Administered 2012-07-13: 1 via ORAL
  Filled 2012-07-13 (×2): qty 1

## 2012-07-13 MED ORDER — ACETAMINOPHEN 325 MG PO TABS: 650.0000 mg | ORAL_TABLET | Freq: Four times a day (QID) | ORAL | Status: DC | PRN

## 2012-07-13 MED ORDER — METHOCARBAMOL 500 MG PO TABS: 500.0000 mg | ORAL_TABLET | Freq: Four times a day (QID) | ORAL | Status: DC | PRN

## 2012-07-13 MED ORDER — TRAMADOL HCL 50 MG PO TABS: 50.0000 mg | ORAL_TABLET | Freq: Four times a day (QID) | ORAL | Status: DC | PRN

## 2012-07-13 MED ORDER — ONDANSETRON HCL 4 MG PO TABS: 4.0000 mg | ORAL_TABLET | Freq: Four times a day (QID) | ORAL | Status: DC | PRN

## 2012-07-13 MED ORDER — POLYETHYLENE GLYCOL 3350 17 G PO PACK: 17.0000 g | PACK | Freq: Every day | ORAL | Status: DC | PRN

## 2012-07-13 MED ORDER — DSS 100 MG PO CAPS: 100.0000 mg | ORAL_CAPSULE | Freq: Two times a day (BID) | ORAL | Status: DC

## 2012-07-13 MED ORDER — POLYSACCHARIDE IRON COMPLEX 150 MG PO CAPS: 150.0000 mg | ORAL_CAPSULE | Freq: Two times a day (BID) | ORAL | Status: DC

## 2012-07-13 MED ORDER — AMOXICILLIN-POT CLAVULANATE 875-125 MG PO TABS: 1.0000 | ORAL_TABLET | Freq: Two times a day (BID) | ORAL | Status: DC

## 2012-07-13 MED ORDER — HYDROCODONE-ACETAMINOPHEN 5-325 MG PO TABS: 1.0000 | ORAL_TABLET | ORAL | Status: DC | PRN

## 2012-07-13 MED ORDER — BISACODYL 10 MG RE SUPP: 10.0000 mg | Freq: Every day | RECTAL | Status: DC | PRN

## 2012-07-13 MED ORDER — POTASSIUM CHLORIDE CRYS ER 20 MEQ PO TBCR
40.0000 meq | EXTENDED_RELEASE_TABLET | Freq: Two times a day (BID) | ORAL | Status: DC
  Administered 2012-07-13: 40 meq via ORAL
  Filled 2012-07-13 (×2): qty 2

## 2012-07-13 MED ORDER — NYSTATIN 100000 UNIT/ML MT SUSP
5.0000 mL | Freq: Four times a day (QID) | OROMUCOSAL | Status: DC
  Administered 2012-07-13: 500000 [IU] via ORAL
  Filled 2012-07-13 (×3): qty 5

## 2012-07-13 MED ORDER — NYSTATIN 100000 UNIT/ML MT SUSP: 5.0000 mL | Freq: Four times a day (QID) | OROMUCOSAL | Status: DC

## 2012-07-13 MED ORDER — RIVAROXABAN 10 MG PO TABS: 10.0000 mg | ORAL_TABLET | Freq: Every day | ORAL | Status: DC

## 2012-07-13 NOTE — Consult Note (Addendum)
[facility] Medical Consultation  [name] XBM:[id number] DOB: 07/14/1931 DOA: 07/09/2012 PCP: Toni Floro, MD   Requesting physician: Ollen Gross, MD Date of consultation: 07/13/2012 Reason for consultation: pneumonia  Impression/Recommendations 1. S/p total right hip arthroplasty--management per orthopedics, including DVT prophylaxis. 2. S/p acute encephalopathy--resolved, secondary to narcotics. Suggest minimizing narcotics. 3. RLL pneumonia--afebrile, no hypoxia or tachypnea. Suspect aspiration based on history, confusion as above. Suggest change to Augmentin 875 mg PO BID x 10 days. No history of dysphagia so suspect temporary phenomenon. Recommend ST evaluation for diet suggestion. 4. Hyponatremia--asymptomatic, mild, secondary to dehydration, suggest pushing oral fluids (IV placement unsuccessful). Follow BMP daily until stable. 5. Hypokalemia--replete. Follow BMP as above. 6. Dehydration--secondary to sore throat post-operatively and poor oral intake. Appears to have had simple face mask rather than ETT or LMA. No definite evidence of thrush on exam (and not on treatment), suggest empiric treatment with Nystatin. Continue Chloroseptic.  7. ABLA--appears stable, no indication for transfusion at this time. 8. HTN--well-controlled off HCTZ. Suggest not resuming HCTZ on discharge secondary to electrolyte abnormalities. Observe off medications.  I see no barrier to planned discharge later today once ST evaluation complete. Discussed with requesting provider [name]. Will sign off. I am here through the weekend, please contact me if I can be of further assistance.  Discussed with daughter at bedside.  Toni Sacks, MD [facility] Team 6 Pager [phone]  If 8PM-8AM, please contact night-coverage [email or web address] Password [email or web address]  Chief Complaint: pneumonia  HPI:  78 year old woman PMH HTN, OA of hip, admitted 10/14 for elective total right hip  replacement. She underwent right THA 10/14 as scheduled with no immediate complication. Post-operative course was complicated by ABLA, hyponatremia, hypokalemia. PT recommended SNF.  10/16 PM had fever 102.0, treated with Tylenol. Also noted to be confused on narcotics 10/16 PM but confusion was noted to be resolved 10/17 AM. Narcotics were decreased and U/A and CXR were obtained. Urinalysis was negative, CXR suggested RLL pneumonia. I was asked to provide recommendations for treatment of pneumonia.  Labwork notable for mild hyponatremia, hypokalemia, hypochloremia, WBC 15.9, Hgb 7.6. Urine culture pending.  Patient complains of sore throat but has no other complaints. Eating poorly secondary to sore throat. No recurrence of confusion.  Review of Systems:   Negative for fever, visual changes, rash, new muscle aches, chest pain, SOB, dysuria, bleeding, n/v/abdominal pain.   Past Medical History  Diagnosis Date  . PONV (postoperative nausea and vomiting)   . Hypertension   . Arthritis   . History of blood transfusion    Past Surgical History  Procedure Date  . Cholecystectomy   . Breast surgery 1960    cystectomy bilateral  . Eye surgery     bilateral cataract extraction,   laser surgery  . Detatched retina   . Mohs surgery     lower lip  . Appendectomy   . Tubal ligation   . Abdominal hysterectomy   . Joint replacement     right knee  . Total hip arthroplasty 07/09/2012    Procedure: TOTAL HIP ARTHROPLASTY;  Surgeon: Loanne Drilling, MD;  Location: [facility] ORS;  Service: Orthopedics;  Laterality: Right;   Social History:  reports that she has never smoked. She has never used smokeless tobacco. She reports that she does not drink alcohol. Her drug history not on file.  No Known Allergies  Family History  Problem Relation Age of Onset  . Cancer Father     Prior to Admission medications  Medication Sig Start Date End Date Taking? Authorizing Provider  acetaminophen (TYLENOL)  500 MG tablet Take 1,000 mg by mouth every 6 (six) hours as needed.   Yes Historical Provider, MD  aspirin 81 MG chewable tablet Chew 81 mg by mouth at bedtime.    Yes Historical Provider, MD  diazepam (VALIUM) 5 MG tablet Take 5 mg by mouth every 6 (six) hours as needed.   Yes Historical Provider, MD  fish oil-omega-3 fatty acids 1000 MG capsule Take 1 g by mouth daily.   Yes Historical Provider, MD  multivitamin-iron-minerals-folic acid (CENTRUM) chewable tablet Chew 1 tablet by mouth daily. Centrum chewable   Yes Historical Provider, MD  hydrochlorothiazide (HYDRODIURIL) 25 MG tablet Take 25 mg by mouth daily.    Historical Provider, MD  simvastatin (ZOCOR) 20 MG tablet Take 20 mg by mouth at bedtime.     Historical Provider, MD   Physical Exam: Filed Vitals:   07/12/12 1558 07/12/12 1651 07/12/12 2156 07/13/12 0533  BP:   123/72 124/62  Pulse:   85 87  Temp:  98.2 F (36.8 C) 99.5 F (37.5 C) 98.5 F (36.9 C)  TempSrc:   Oral Oral  Resp: 16  16 14   Height:      Weight:      SpO2:   97% 96%    General:  Appears calm and comfortable. Eyes: PERRL, normal lids, irises  ENT: grossly normal hearing, lips & tongue (difficult to detect linear scar lower lip). Mouth and oropharynx appear unremarkable without exudate or erythema. Cardiovascular: RRR, no m/r/g. No LE edema. Respiratory: CTA bilaterally, no w/r/r. Normal respiratory effort. Abdomen: soft, ntnd Skin: no rash or induration seen on limited exam Musculoskeletal: grossly normal tone BUE/BLE Psychiatric: grossly normal mood and affect, speech fluent and appropriate  Labs on Admission:  Basic Metabolic Panel:  Lab 07/13/12 9562 07/12/12 0350 07/11/12 0345 07/10/12 0409  NA 128* 124* 128* 131*  K 3.0* 3.2* 3.4* 2.7*  CL 90* 90* 93* 94*  CO2 29 25 27 27   GLUCOSE 128* 119* 118* 126*  BUN 10 10 10 10   CREATININE 0.64 0.78 0.74 0.65  CALCIUM 8.7 7.8* 8.0* 7.7*  MG -- -- -- --  PHOS -- -- -- --   CBC:  Lab 07/13/12 0350  07/12/12 0350 07/11/12 0345 07/10/12 0409  WBC 15.9* 13.5* 11.4* 6.4  NEUTROABS -- -- -- --  HGB 7.6* 7.5* 8.2* 8.3*  HCT 21.4* 21.0* 23.9* 23.8*  MCV 88.1 90.1 90.5 89.8  PLT 312 257 286 281   Radiological Exams on Admission: Dg Chest 2 View  07/12/2012  *RADIOLOGY REPORT*  Clinical Data: Post TKR, low grade fever, nonproductive cough  CHEST - 2 VIEW  Comparison: 07/03/2012  Findings: Normal heart size, mediastinal contours, and pulmonary vascularity. Medial right lower lobe infiltrate consistent with pneumonia. Minimal subsegmental atelectasis at lung bases. Remaining lungs clear. Atherosclerotic calcification aorta. No pleural effusion or pneumothorax. Calcified granuloma left lung base again noted.  IMPRESSION: Right lower lobe infiltrate consistent with pneumonia.   Original Report Authenticated By: Lollie Marrow, M.D.      Principal Problem:  *OA (osteoarthritis) of hip Active Problems:  Postop Acute blood loss anemia  Postop Hyponatremia  Postop Hypokalemia  Postop Altered mental state    Time spent: 45 minutes  Toni Sacks, MD [facility] Team 6 Pager [phone]  If 8PM-8AM, please contact night-coverage [email or web address] Password [email or web address] 07/13/2012, 9:23 AM

## 2012-07-13 NOTE — Progress Notes (Signed)
Physical Therapy Treatment Patient Details Name: [name] MRN: [id number] DOB: [birth year] Today's Date: 07/13/2012 Time: 1026-1100 PT Time Calculation (min): 34 min  PT Assessment / Plan / Recommendation Comments on Treatment Session  Marked improvement in activity tolerance.    Follow Up Recommendations  Post acute inpatient     Does the patient have the potential to tolerate intense rehabilitation  No, Recommend SNF  Barriers to Discharge        Equipment Recommendations  3 in 1 bedside comode    Recommendations for Other Services OT consult  Frequency 7X/week   Plan Frequency remains appropriate    Precautions / Restrictions Precautions Precautions: Posterior Hip Precaution Comments: Pt able to recall 3/3 hip precautions. Restrictions Weight Bearing Restrictions: No Other Position/Activity Restrictions: WBAT   Pertinent Vitals/Pain 3/10, ice pack provided, premedicated    Mobility  Bed Mobility Bed Mobility: Supine to Sit Supine to Sit: 4: Min assist Details for Bed Mobility Assistance: Min cues for use of LLE and UEs to self assist, THP. Physical A needed for both LEs. Transfers Transfers: Sit to Stand;Stand to Sit Sit to Stand: 4: Min assist;3: Mod assist Stand to Sit: 4: Min assist;3: Mod assist Details for Transfer Assistance: mod cues for hand placement and LE management. Pt required physical A to rise, stabilize self and control descent. Ambulation/Gait Ambulation/Gait Assistance: 4: Min assist Ambulation Distance (Feet): 63 Feet Assistive device: Rolling walker Ambulation/Gait Assistance Details: cues for posture, stride length, step-to gait, and position from RW Gait Pattern: Step-to pattern    Exercises Total Joint Exercises Ankle Circles/Pumps: AROM;Supine;Both;20 reps Gluteal Sets: AROM;Both;Supine;20 reps Heel Slides: AAROM;Right;Supine;20 reps Hip ABduction/ADduction: AAROM;Supine;Right;20 reps   PT Diagnosis:    PT Problem List:     PT Treatment Interventions:     PT Goals Acute Rehab PT Goals PT Goal Formulation: With patient Time For Goal Achievement: 07/17/12 Potential to Achieve Goals: Good Pt will go Supine/Side to Sit: with supervision PT Goal: Supine/Side to Sit - Progress: Progressing toward goal Pt will go Sit to Supine/Side: with supervision PT Goal: Sit to Supine/Side - Progress: Progressing toward goal Pt will go Sit to Stand: with supervision PT Goal: Sit to Stand - Progress: Progressing toward goal Pt will go Stand to Sit: with supervision PT Goal: Stand to Sit - Progress: Progressing toward goal Pt will Ambulate: 51 - 150 feet;with supervision;with rolling walker PT Goal: Ambulate - Progress: Progressing toward goal  Visit Information  Last PT Received On: 07/13/12 Assistance Needed: +1    Subjective Data  Subjective: I am feeling so much better, I think I just needed to rest yesterday Patient Stated Goal: Resume previous lifestyle with decreased pain   Cognition  Overall Cognitive Status: Appears within functional limits for tasks assessed/performed Arousal/Alertness: Awake/alert Orientation Level: Appears intact for tasks assessed Behavior During Session: [facility] for tasks performed    Balance     End of Session PT - End of Session Activity Tolerance: Patient tolerated treatment well Patient left: in chair;with call bell/phone within reach;with family/visitor present Nurse Communication: Mobility status   GP     [name] 07/13/2012, 12:41 PM

## 2012-07-13 NOTE — Progress Notes (Signed)
Patient cleared for discharge to [facility]. Packet copied and placed in [facility]. ptar called for transportation.  Corinthian Kemler C. Khushi Zupko MSW, LCSW [phone]  

## 2012-07-13 NOTE — Discharge Summary (Signed)
Physician Discharge Summary   Patient ID: [name] MRN: [id number] DOB/AGE: [birth year] 78 y.o.  Admit date: 07/09/2012 Discharge date: 07/13/2012  Primary Diagnosis: Osteoarthritis, right hip.   Admission Diagnoses:  Past Medical History  Diagnosis Date  . PONV (postoperative nausea and vomiting)   . Hypertension   . Arthritis   . History of blood transfusion    Discharge Diagnoses:   Principal Problem:  *OA (osteoarthritis) of hip Active Problems:  Postop Acute blood loss anemia  Postop Hyponatremia  Postop Hypokalemia  Postop Altered mental state  Aspiration pneumonia  Estimated Body mass index is 22.50 kg/(m^2) as calculated from the following:   Height as of this encounter: 5\' 3" (1.6 m).   Weight as of this encounter: 127 lb(57.607 kg).  Classification of overweight in adults according to BMI (WHO, 1998)   Procedure: Procedure(s) (LRB): TOTAL HIP ARTHROPLASTY (Right)   Consults: Hospitalist- Medicine - Dr. Irene Limbo  HPI: [name] is a 78 year old female with severe end-stage arthritis of her right hip with progressively worsening pain and dysfunction. She has bone-on-bone arthritis. She has had extensive nonoperative management including analgesics and protected weightbearing. She had intra-articular injection without much benefit. She presents now for right total hip arthroplasty.  Laboratory Data: Hospital Outpatient Visit on 07/03/2012  Component Date Value Range Status  . aPTT 07/03/2012 38* 24 - 37 seconds Final   Comment:                                 IF BASELINE aPTT IS ELEVATED,                          SUGGEST PATIENT RISK ASSESSMENT                          BE USED TO DETERMINE APPROPRIATE                          ANTICOAGULANT THERAPY.  . WBC 07/03/2012 6.4  4.0 - 10.5 K/uL Final  . RBC 07/03/2012 3.95  3.87 - 5.11 MIL/uL Final  . Hemoglobin 07/03/2012 12.3  12.0 - 15.0 g/dL Final  . HCT 56/21/3086 35.2* 36.0 - 46.0 % Final  .  MCV 07/03/2012 89.1  78.0 - 100.0 fL Final  . MCH 07/03/2012 31.1  26.0 - 34.0 pg Final  . MCHC 07/03/2012 34.9  30.0 - 36.0 g/dL Final  . RDW 57/84/6962 12.5  11.5 - 15.5 % Final  . Platelets 07/03/2012 416* 150 - 400 K/uL Final  . Sodium 07/03/2012 134* 135 - 145 mEq/L Final  . Potassium 07/03/2012 3.0* 3.5 - 5.1 mEq/L Final  . Chloride 07/03/2012 91* 96 - 112 mEq/L Final  . CO2 07/03/2012 32  19 - 32 mEq/L Final  . Glucose, Bld 07/03/2012 100* 70 - 99 mg/dL Final  . BUN 95/28/4132 13  6 - 23 mg/dL Final  . Creatinine, Ser 07/03/2012 0.81  0.50 - 1.10 mg/dL Final  . Calcium 44/09/270 10.0  8.4 - 10.5 mg/dL Final  . Total Protein 07/03/2012 7.8  6.0 - 8.3 g/dL Final  . Albumin 53/66/4403 4.0  3.5 - 5.2 g/dL Final  . AST 47/42/5956 19  0 - 37 U/L Final  . ALT 07/03/2012 17  0 - 35 U/L Final  . Alkaline Phosphatase 07/03/2012 64  39 - 117  U/L Final  . Total Bilirubin 07/03/2012 0.2* 0.3 - 1.2 mg/dL Final  . GFR calc non Af Amer 07/03/2012 67* >90 mL/min Final  . GFR calc Af Amer 07/03/2012 78* >90 mL/min Final   Comment:                                 The eGFR has been calculated                          using the CKD EPI equation.                          This calculation has not been                          validated in all clinical                          situations.                          eGFR's persistently                          <90 mL/min signify                          possible Chronic Kidney Disease.  Marland Kitchen Prothrombin Time 07/03/2012 12.2  11.6 - 15.2 seconds Final  . INR 07/03/2012 0.91  0.00 - 1.49 Final  . Color, Urine 07/03/2012 YELLOW  YELLOW Final  . APPearance 07/03/2012 CLEAR  CLEAR Final  . Specific Gravity, Urine 07/03/2012 1.013  1.005 - 1.030 Final  . pH 07/03/2012 7.0  5.0 - 8.0 Final  . Glucose, UA 07/03/2012 NEGATIVE  NEGATIVE mg/dL Final  . Hgb urine dipstick 07/03/2012 NEGATIVE  NEGATIVE Final  . Bilirubin Urine 07/03/2012 NEGATIVE  NEGATIVE Final    . Ketones, ur 07/03/2012 NEGATIVE  NEGATIVE mg/dL Final  . Protein, ur 16/06/9603 NEGATIVE  NEGATIVE mg/dL Final  . Urobilinogen, UA 07/03/2012 0.2  0.0 - 1.0 mg/dL Final  . Nitrite 54/05/8118 NEGATIVE  NEGATIVE Final  . Leukocytes, UA 07/03/2012 NEGATIVE  NEGATIVE Final   MICROSCOPIC NOT DONE ON URINES WITH NEGATIVE PROTEIN, BLOOD, LEUKOCYTES, NITRITE, OR GLUCOSE <1000 mg/dL.  Marland Kitchen MRSA, PCR 07/03/2012 NEGATIVE  NEGATIVE Final  . Staphylococcus aureus 07/03/2012 POSITIVE* NEGATIVE Final   Comment:                                 The Xpert SA Assay (FDA                          approved for NASAL specimens                          in patients over 23 years of age),                          is one component of  a comprehensive surveillance                          program.  Test performance has                          been validated by [facility] for patients greater                          than or equal to 55 year old.                          It is not intended                          to diagnose infection nor to                          guide or monitor treatment.    Basename 07/13/12 0350 07/12/12 0350 07/11/12 0345  HGB 7.6* 7.5* 8.2*    Basename 07/13/12 0350 07/12/12 0350  WBC 15.9* 13.5*  RBC 2.43* 2.33*  HCT 21.4* 21.0*  PLT 312 257    Basename 07/13/12 0350 07/12/12 0350  NA 128* 124*  K 3.0* 3.2*  CL 90* 90*  CO2 29 25  BUN 10 10  CREATININE 0.64 0.78  GLUCOSE 128* 119*  CALCIUM 8.7 7.8*   No results found for this basename: LABPT:2,INR:2 in the last 72 hours  X-Rays:Dg Chest 2 View  07/12/2012  *RADIOLOGY REPORT*  Clinical Data: Post TKR, low grade fever, nonproductive cough  CHEST - 2 VIEW  Comparison: 07/03/2012  Findings: Normal heart size, mediastinal contours, and pulmonary vascularity. Medial right lower lobe infiltrate consistent with pneumonia. Minimal subsegmental atelectasis at lung bases.  Remaining lungs clear. Atherosclerotic calcification aorta. No pleural effusion or pneumothorax. Calcified granuloma left lung base again noted.  IMPRESSION: Right lower lobe infiltrate consistent with pneumonia.   Original Report Authenticated By: Lollie Marrow, M.D.    Dg Chest 2 View  07/03/2012  *RADIOLOGY REPORT*  Clinical Data: Preop hip replacement  CHEST - 2 VIEW  Comparison: 03/19/2011 the  Findings: Heart size and vascularity are normal.  Negative for heart failure or pneumonia.  No mass or effusion is present.  IMPRESSION: No active cardiopulmonary disease.   Original Report Authenticated By: Camelia Phenes, M.D.    Dg Hip Complete Right  07/03/2012  *RADIOLOGY REPORT*  Clinical Data: Preoperative evaluation for right total hip replacement.  RIGHT HIP - COMPLETE 2+ VIEW  Comparison: No priors.  Findings: AP view of the pelvis and AP and lateral views of the right hip demonstrate severe joint space narrowing, subchondral sclerosis, subchondral cyst formation and osteophyte formation in the right hip, compatible with advanced osteoarthritis.  There is only a very mild osteoarthritis in the left hip joint.  No acute fracture, subluxation or dislocation is noted.  IMPRESSION: 1.  Advanced right hip joint osteoarthritis.   Original Report Authenticated By: Florencia Reasons, M.D.    Dg Pelvis Portable  07/09/2012  *RADIOLOGY REPORT*  Clinical Data: Severe osteoarthritis of the right hip.  PORTABLE PELVIS  Comparison: Radiographs dated 07/03/2012  Findings:  There is a slight protrusion of the acetabular component through the medial wall of the acetabulum.  Femoral component appears in excellent position.  Soft tissue drain in place.  No fractures.  IMPRESSION: Slight protrusion of the acetabular component through the medial aspect of the acetabulum as described.   Original Report Authenticated By: Gwynn Burly, M.D.    Dg Hip Portable 1 View Right  07/09/2012  *RADIOLOGY REPORT*  Clinical  Data: Osteoarthritis of the right hip.  PORTABLE RIGHT HIP - 1 VIEW  Comparison: Radiographs dated 07/03/2012  Findings:  There is slight protrusion of the acetabular component through the medial wall of the acetabulum.  The femoral component appears in excellent position.  Soft tissue drain in place.  No fractures.  IMPRESSION: Slight protrusion of the acetabular component as described.   Original Report Authenticated By: Gwynn Burly, M.D.     EKG: Orders placed during the hospital encounter of 07/09/12  . EKG     Hospital Course:  Patient was admitted to [facility] and taken to the OR and underwent the above state procedure without complications.  Patient tolerated the procedure well and was later transferred to the recovery room and then to the orthopaedic floor for postoperative care.  They were given PO and IV analgesics for pain control following their surgery.  They were given 24 hours of postoperative antibiotics of Ancef and started on DVT prophylaxis in the form of Xarelto, Aspirin 81 mg on hold.   PT and OT were ordered for total hip protocol.  The patient was allowed to be WBAT with therapy. Discharge planning was consulted to help with postop disposition and equipment needs.  Patient had a good night on the evening of surgery and started to get up OOB with therapy on day one.  Hemovac drain was pulled without difficulty.  The knee immobilizer was removed and discontinued.  Continued to work with therapy into day two and slowly progressing.  Dressing was changed on day two and the incision was healing well. At that time, it was felt that the patient may need a shot stay in the SNF untit.  We got the social worker involved.  By day postop day three, family member stated that the patient became confused on the oral narcotics the night before. She was better on day three. Confusion had resolved.  Changed and reduce the oral pain pills.  As per staff: Patient spiked a fever around  102.0 around 10pm last night. 650mg  of tylenol given PO and temp was rechecked in an hour. No change in temp was noted. Patient was encouraged to use IS and reached about 2000. Temp was rechecked again around 1am- still no change in temp. Patient had yet to void around 1:30 am- bladder scanned around 2am and showed 350cc. I&O cath done at 2:30 with 400cc of urine out. At 4am tylenol suppository was given and temp was rechecked about an hour and a half later. Temp still lingered around 102.0., and patient has voided 50cc.  Was seen in rounds and checked her urine and CXR. Kept that day and workuped temp.  Patient also had complaints of left knee pain. Found to have effusion on the left knee. Recommended bedside aspiration and injection into the left knee. Bedside Procedure  An aspiration and steroid injection was performed at bedside by Dr. Lequita Halt using 1% plain Lidocaine and 80 mg of DepoMedrol. 40 cc's of normal appearing joint fluid was removed. This was well tolerated.  She walked over twenty feet on day three. On day four, the patient was doing fairly well.  Daughter was in room at bedside. Patient was having problems with elevated temp the day before, day three, and increased white count. No more confusion though.  CXR the day before on 07/12/12. CHEST - 2 VIEW  Comparison: 07/03/2012  Findings:  Normal heart size, mediastinal contours, and pulmonary vascularity.  Medial right lower lobe infiltrate consistent with pneumonia.  Minimal subsegmental atelectasis at lung bases.  Remaining lungs clear.  Atherosclerotic calcification aorta.  No pleural effusion or pneumothorax.  Calcified granuloma left lung base again noted.  IMPRESSION:  Right lower lobe infiltrate consistent with pneumonia.  Medical Consult was called.  I spoke with Dr. Irene Limbo who came by to evaluate the patient. He recommended to continue on the Avelox. Plan will be to go to [facility]. Dr. Irene Limbo evaluated the  patient: Impression/Recommendations  1. S/p total right hip arthroplasty--management per orthopedics, including DVT prophylaxis. 2. S/p acute encephalopathy--resolved, secondary to narcotics. Suggest minimizing narcotics. 3. RLL pneumonia--afebrile, no hypoxia or tachypnea. Suspect aspiration based on history, confusion as above. Suggest change to Augmentin 875 mg PO BID x 10 days. No history of dysphagia so suspect temporary phenomenon. Recommend ST evaluation for diet suggestion. 4. Hyponatremia--asymptomatic, mild, secondary to dehydration, suggest pushing oral fluids (IV placement unsuccessful). Follow BMP daily until stable. 5. Hypokalemia--replete. Follow BMP as above. 6. Dehydration--secondary to sore throat post-operatively and poor oral intake. Appears to have had simple face mask rather than ETT or LMA. No definite evidence of thrush on exam (and not on treatment), suggest empiric treatment with Nystatin. Continue Chloroseptic.  7. ABLA--appears stable, no indication for transfusion at this time. 8. HTN--well-controlled off HCTZ. Suggest not resuming HCTZ on discharge secondary to electrolyte abnormalities. Observe off medications. I see no barrier to planned discharge later today once ST evaluation complete. Discussed with requesting provider [name]. Will sign off. I am here through the weekend, please contact me if I can be of further assistance.  Discussed with daughter at bedside.  Incision was healing well.  Patient was seen in rounds and was ready to go over to [facility] at this time.   Discharge Medications: Prior to Admission medications   Medication Sig Start Date End Date Taking? Authorizing Provider  acetaminophen (TYLENOL) 500 MG tablet Take 1,000 mg by mouth every 6 (six) hours as needed.   Yes Historical Provider, MD  aspirin 81 MG chewable tablet Chew 81 mg by mouth at bedtime.    Yes Historical Provider, MD  diazepam (VALIUM) 5 MG tablet Take 5 mg by mouth  every 6 (six) hours as needed.   Yes Historical Provider, MD  fish oil-omega-3 fatty acids 1000 MG capsule Take 1 g by mouth daily.   Yes Historical Provider, MD  multivitamin-iron-minerals-folic acid (CENTRUM) chewable tablet Chew 1 tablet by mouth daily. Centrum chewable   Yes Historical Provider, MD  hydrochlorothiazide (HYDRODIURIL) 25 MG tablet Take 25 mg by mouth daily.    Historical Provider, MD  simvastatin (ZOCOR) 20 MG tablet Take 20 mg by mouth at bedtime.     Historical Provider, MD    Diet: Cardiac diet Activity:WBAT No bending hip over 90 degrees- A "L" Angle Do not cross legs Do not let foot roll inward When turning these patients a pillow should be placed between the patient's legs to prevent crossing. Patients should have the affected knee fully extended when trying to sit or stand from all  surfaces to prevent excessive hip flexion. When ambulating and turning toward the affected side the affected leg should have the toes turned out prior to moving the walker and the rest of patient's body as to prevent internal rotation/ turning in of the leg. Abduction pillows are the most effective way to prevent a patient from not crossing legs or turning toes in at rest. If an abduction pillow is not ordered placing a regular pillow length wise between the patient's legs is also an effective reminder. It is imperative that these precautions be maintained so that the surgical hip does not dislocate. Follow-up:in 2 weeks Disposition - Skilled nursing facility - [facility] Discharged Condition: stable       Discharge Orders    Future Appointments: Provider: Department: Dept Phone: Center:   01/14/2013 8:30 AM Sherrie George, MD [facility] [phone] None     Future Orders Please Complete By Expires   Diet - low sodium heart healthy      Call MD / Call 911      Comments:   If you experience chest pain or shortness of breath, CALL 911 and be transported to the hospital  emergency room.  If you develope a fever above 101 F, pus (white drainage) or increased drainage or redness at the wound, or calf pain, call your surgeon's office.   Discharge instructions      Comments:   Pick up stool softner and laxative for home. Do not submerge incision under water. May shower. Continue to use ice for pain and swelling from surgery. Hip precautions.  Total Hip Protocol.  Take Xarelto for two and a half more weeks, then discontinue Xarelto.   Constipation Prevention      Comments:   Drink plenty of fluids.  Prune juice may be helpful.  You may use a stool softener, such as Colace (over the counter) 100 mg twice a day.  Use MiraLax (over the counter) for constipation as needed.   Increase activity slowly as tolerated      Patient may shower      Comments:   You may shower without a dressing once there is no drainage.  Do not wash over the wound.  If drainage remains, do not shower until drainage stops.   Weight bearing as tolerated      Driving restrictions      Comments:   No driving until released by the physician.   Lifting restrictions      Comments:   No lifting until released by the physician.   Follow the hip precautions as taught in Physical Therapy      Change dressing      Comments:   You may change your dressing dressing daily with sterile 4 x 4 inch gauze dressing and paper tape.  Do not submerge the incision under water.   TED hose      Comments:   Use stockings (TED hose) for 3 weeks on both leg(s).  You may remove them at night for sleeping.   Do not sit on low chairs, stoools or toilet seats, as it may be difficult to get up from low surfaces          Medication List     As of 07/13/2012 12:14 PM    STOP taking these medications         aspirin 81 MG chewable tablet      diazepam 5 MG tablet   Commonly known as: VALIUM  fish oil-omega-3 fatty acids 1000 MG capsule      hydrochlorothiazide 25 MG tablet   Commonly known as:  HYDRODIURIL    Held due to low sodium      multivitamin-iron-minerals-folic acid chewable tablet      TAKE these medications         acetaminophen 325 MG tablet   Commonly known as: TYLENOL   Take 2 tablets (650 mg total) by mouth every 6 (six) hours as needed (or Fever >/= 101).      amoxicillin-clavulanate 875-125 MG per tablet   Commonly known as: AUGMENTIN   Take 1 tablet by mouth every 12 (twelve) hours.  TAKE FOR 10 DAYS      bisacodyl 10 MG suppository   Commonly known as: DULCOLAX   Place 1 suppository (10 mg total) rectally daily as needed.      DSS 100 MG Caps   Take 100 mg by mouth 2 (two) times daily.      HYDROcodone-acetaminophen 5-325 MG per tablet   Commonly known as: NORCO/VICODIN   Take 1-2 tablets by mouth every 4 (four) hours as needed.      iron polysaccharides 150 MG capsule   Commonly known as: NIFEREX   Take 1 capsule (150 mg total) by mouth 2 (two) times daily.      methocarbamol 500 MG tablet   Commonly known as: ROBAXIN   Take 1 tablet (500 mg total) by mouth every 6 (six) hours as needed.      nystatin 100000 UNIT/ML suspension   Commonly known as: MYCOSTATIN   Take 5 mLs (500,000 Units total) by mouth 4 (four) times daily.      ondansetron 4 MG tablet   Commonly known as: ZOFRAN   Take 1 tablet (4 mg total) by mouth every 6 (six) hours as needed for nausea.      polyethylene glycol packet   Commonly known as: MIRALAX / GLYCOLAX   Take 17 g by mouth daily as needed.      rivaroxaban 10 MG Tabs tablet   Commonly known as: XARELTO   Take 1 tablet (10 mg total) by mouth daily with breakfast. Take Xarelto for two and a half more weeks, then discontinue Xarelto.  Once the patient has completed the Xarelto, they may resume the 81 mg Aspirin.      simvastatin 20 MG tablet   Commonly known as: ZOCOR   Take 20 mg by mouth at bedtime.      traMADol 50 MG tablet   Commonly known as: ULTRAM   Take 1-2 tablets (50-100 mg total) by mouth every 6  (six) hours as needed (mild pain).        Follow-up Information    Follow up with Loanne Drilling, MD. Schedule an appointment as soon as possible for a visit in 2 weeks.   Contact information:   [address] [place] [address] [phone]          Signed: Patrica Duel 07/13/2012, 12:14 PM

## 2012-07-13 NOTE — Evaluation (Signed)
Clinical/Bedside Swallow Evaluation Patient Details  Name: [name] MRN: [id number] Date of Birth: 1931/02/11  Today's Date: 07/13/2012 Time: 6578-4696 SLP Time Calculation (min): 35 min  Past Medical History:  Past Medical History  Diagnosis Date  . PONV (postoperative nausea and vomiting)   . Hypertension   . Arthritis   . History of blood transfusion    Past Surgical History:  Past Surgical History  Procedure Date  . Cholecystectomy   . Breast surgery 1960    cystectomy bilateral  . Eye surgery     bilateral cataract extraction,   laser surgery  . Detatched retina   . Mohs surgery     lower lip  . Appendectomy   . Tubal ligation   . Abdominal hysterectomy   . Joint replacement     right knee  . Total hip arthroplasty 07/09/2012    Procedure: TOTAL HIP ARTHROPLASTY;  Surgeon: Loanne Drilling, MD;  Location: [facility];  Service: Orthopedics;  Laterality: Right;   HPI:  S/p total right hip arthroplasty--management per orthopedics, including DVT prophylaxis.   Assessment / Plan / Recommendation Clinical Impression  Patient appears to have  normal oropharyngeal swallow function, with only difficulty noted was odynophagia.  Oral examination revealed an ulcerated appearance to the uvula.  Patient is currently on antibiotics and magic mouthwash for thrush.  Patient demonstrates an effortful swallow, with facial grimmacing during the swallow due to pain.  Good laryngeal elevation was palpated, there was no coughing or audible throat clearing after the swallow, and patient's voice was clear.  Discussed above with Dr. Irene Limbo, with question of source of RLL pneumonia.  MD feels paitient may have had a one time aspiration event after surgery, as she experienced  encephalopathy, which has now resolved.    Aspiration Risk  Mild    Diet Recommendation Dysphagia 1 (Puree);Thin liquid   Medication Administration: Crushed with puree Compensations: Slow rate;Small  sips/bites Postural Changes and/or Swallow Maneuvers: Out of bed for meals;Seated upright 90 degrees    Other  Recommendations Oral Care Recommendations: Oral care QID;Oral care before and after PO;Patient independent with oral care Other Recommendations: Clarify dietary restrictions (Patient may gradually advance her diet to regular as tol.)   Follow Up Recommendations  Skilled Nursing facility    Frequency and Duration        Pertinent Vitals/Pain "11" pain with swallowing due to sore throat.     SLP Swallow Goals     Swallow Study Prior Functional Status       General HPI: S/p total right hip arthroplasty--management per orthopedics, including DVT prophylaxis. Type of Study: Bedside swallow evaluation Previous Swallow Assessment: N/A    Oral/Motor/Sensory Function Overall Oral Motor/Sensory Function: Appears within functional limits for tasks assessed   Ice Chips     Thin Liquid Thin Liquid: Within functional limits Other Comments: Effortful swallow noted, with facial grimmace and c/o of pain during swallowing.    Nectar Thick Nectar Thick Liquid: Not tested   Honey Thick     Puree Puree: Within functional limits Presentation: Self Fed Other Comments: Odynophagia   Solid   GO    Solid: Not tested (Pt. declined due to sore throat.)       Maryjo Rochester T 07/13/2012,12:30 PM

## 2012-07-13 NOTE — Progress Notes (Signed)
Physical Therapy Treatment Patient Details Name: [name] MRN: [id number] DOB: [birth year] Today's Date: 07/13/2012 Time: 4098-1191 PT Time Calculation (min): 27 min  PT Assessment / Plan / Recommendation Comments on Treatment Session  Marked improvement in activity tolerance.    Follow Up Recommendations  Post acute inpatient     Does the patient have the potential to tolerate intense rehabilitation  No, Recommend SNF  Barriers to Discharge        Equipment Recommendations  3 in 1 bedside comode    Recommendations for Other Services OT consult  Frequency 7X/week   Plan Frequency remains appropriate    Precautions / Restrictions Precautions Precautions: Posterior Hip Precaution Comments: Pt able to recall 3/3 hip precautions. Restrictions Weight Bearing Restrictions: No Other Position/Activity Restrictions: WBAT   Pertinent Vitals/Pain Min c/o pain throughout session    Mobility  Bed Mobility Bed Mobility: Sit to Supine Sit to Supine: 4: Min assist Details for Bed Mobility Assistance: Min cues for use of LLE and UEs to self assist, THP. Physical A needed for both LEs. Transfers Transfers: Sit to Stand;Stand to Sit Sit to Stand: 4: Min assist Stand to Sit: 4: Min assist Details for Transfer Assistance: min cues for LE management and use of UEs Ambulation/Gait Ambulation/Gait Assistance: 4: Min assist Ambulation Distance (Feet): 123 Feet (and 20') Assistive device: Rolling walker Ambulation/Gait Assistance Details: cues for posture, sequence, stride length, ER on R,  and position from RW Gait Pattern: Step-to pattern    Exercises     PT Diagnosis:    PT Problem List:   PT Treatment Interventions:     PT Goals Acute Rehab PT Goals PT Goal Formulation: With patient Time For Goal Achievement: 07/17/12 Potential to Achieve Goals: Good Pt will go Supine/Side to Sit: with supervision PT Goal: Supine/Side to Sit - Progress: Progressing toward goal Pt  will go Sit to Supine/Side: with supervision PT Goal: Sit to Supine/Side - Progress: Progressing toward goal Pt will go Sit to Stand: with supervision PT Goal: Sit to Stand - Progress: Progressing toward goal Pt will go Stand to Sit: with supervision PT Goal: Stand to Sit - Progress: Progressing toward goal Pt will Ambulate: 51 - 150 feet;with supervision;with rolling walker PT Goal: Ambulate - Progress: Progressing toward goal Pt will Go Up / Down Stairs: 1-2 stairs;with min assist;with least restrictive assistive device PT Goal: Up/Down Stairs - Progress: Discontinued (comment) (pt d/c changed to SNF)  Visit Information  Last PT Received On: 07/13/12 Assistance Needed: +1    Subjective Data  Subjective: I can't believe how much better I'm doing Patient Stated Goal: Resume previous lifestyle with decreased pain   Cognition  Overall Cognitive Status: Appears within functional limits for tasks assessed/performed Arousal/Alertness: Awake/alert Orientation Level: Appears intact for tasks assessed Behavior During Session: [facility] for tasks performed    Balance     End of Session PT - End of Session Activity Tolerance: Patient tolerated treatment well Patient left: in bed;with call bell/phone within reach;with family/visitor present Nurse Communication: Mobility status   [name]     [name] 07/13/2012, 3:32 PM

## 2012-07-13 NOTE — Progress Notes (Addendum)
Subjective: 4 Days Post-Op Procedure(s) (LRB): TOTAL HIP ARTHROPLASTY (Right) Patient reports pain as mild.   Patient seen in rounds for Dr. Lequita Halt.  Daughter in room Patient is having problems with elevated temp yesterday and increased white count. Plan is to go Skilled nursing facility after hospital stay. No more confusion.   CXR yesterday 07/12/12 - CHEST - 2 VIEW  Comparison: 07/03/2012  Findings:  Normal heart size, mediastinal contours, and pulmonary vascularity.  Medial right lower lobe infiltrate consistent with pneumonia.  Minimal subsegmental atelectasis at lung bases.  Remaining lungs clear.  Atherosclerotic calcification aorta.  No pleural effusion or pneumothorax.  Calcified granuloma left lung base again noted.  IMPRESSION:  Right lower lobe infiltrate consistent with pneumonia.  She was placed on IV Rocephin but her IV had infiltrated. Unable to restart. Staff spoke to the On Call MD and she was placed on PO Avelox.  Also given Magic Mouthwash.  Plan is still to go to [facility] for skilled rehab.  Will get Medical consult to evaluate patient and get recommendations on treatment for the RLL infiltrate, possible pneumonia.  Objective: Vital signs in last 24 hours: Temp:  [98.2 F (36.8 C)-99.5 F (37.5 C)] 98.5 F (36.9 C) (10/18 0533) Pulse Rate:  [68-87] 87  (10/18 0533) Resp:  [14-16] 14  (10/18 0533) BP: (119-124)/(62-72) 124/62 mmHg (10/18 0533) SpO2:  [96 %-97 %] 96 % (10/18 0533)  Intake/Output from previous day:  Intake/Output Summary (Last 24 hours) at 07/13/12 0801 Last data filed at 07/13/12 0534  Gross per 24 hour  Intake    960 ml  Output   2650 ml  Net  -1690 ml    Intake/Output this shift:    Labs:  Basename 07/13/12 0350 07/12/12 0350 07/11/12 0345  HGB 7.6* 7.5* 8.2*    Basename 07/13/12 0350 07/12/12 0350  WBC 15.9* 13.5*  RBC 2.43* 2.33*  HCT 21.4* 21.0*  PLT 312 257    Basename 07/13/12 0350 07/12/12 0350  NA 128*  124*  K 3.0* 3.2*  CL 90* 90*  CO2 29 25  BUN 10 10  CREATININE 0.64 0.78  GLUCOSE 128* 119*  CALCIUM 8.7 7.8*   No results found for this basename: LABPT:2,INR:2 in the last 72 hours  EXAM General - Patient is Alert, Appropriate and Oriented Extremity - Neurovascular intact Sensation intact distally Dorsiflexion/Plantar flexion intact No cellulitis present Dressing/Incision - clean, dry, no drainage, healing Motor Function - intact, moving foot and toes well on exam.   Past Medical History  Diagnosis Date  . PONV (postoperative nausea and vomiting)   . Hypertension   . Arthritis   . History of blood transfusion     Assessment/Plan: 4 Days Post-Op Procedure(s) (LRB): TOTAL HIP ARTHROPLASTY (Right) Principal Problem:  *OA (osteoarthritis) of hip Active Problems:  Postop Acute blood loss anemia  Postop Hyponatremia  Postop Hypokalemia  Postop Altered mental state  Estimated Body mass index is 22.50 kg/(m^2) as calculated from the following:   Height as of this encounter: 5\' 3" (1.6 m).   Weight as of this encounter: 127 lb(57.607 kg).  DVT Prophylaxis - Xarelto, Aspirin 81 mg on hold. Weight Bearing As Tolerated right Leg  Addendum - I spoke with Dr. Irene Limbo who will come by and evaluate the patient.  He recommended to continue on the Avelox for now.  Plan will be to go to [facility].  If she is doing well and everything is OK, she might be able to go  later this afternoon, otherwise, will likely go tomorrow if stable.  PERKINS, ALEXZANDREW 07/13/2012, 8:01 AM

## 2012-07-18 DIAGNOSIS — I10 Essential (primary) hypertension: Secondary | ICD-10-CM | POA: Diagnosis not present

## 2012-07-18 DIAGNOSIS — J690 Pneumonitis due to inhalation of food and vomit: Secondary | ICD-10-CM | POA: Diagnosis not present

## 2012-07-18 DIAGNOSIS — M1610 Unilateral primary osteoarthritis, unspecified hip: Secondary | ICD-10-CM | POA: Diagnosis not present

## 2012-07-18 DIAGNOSIS — D62 Acute posthemorrhagic anemia: Secondary | ICD-10-CM | POA: Diagnosis not present

## 2012-07-26 DIAGNOSIS — M1610 Unilateral primary osteoarthritis, unspecified hip: Secondary | ICD-10-CM | POA: Diagnosis not present

## 2012-07-26 DIAGNOSIS — E785 Hyperlipidemia, unspecified: Secondary | ICD-10-CM | POA: Diagnosis not present

## 2012-07-26 DIAGNOSIS — D62 Acute posthemorrhagic anemia: Secondary | ICD-10-CM | POA: Diagnosis not present

## 2012-07-26 DIAGNOSIS — E871 Hypo-osmolality and hyponatremia: Secondary | ICD-10-CM | POA: Diagnosis not present

## 2012-07-30 DIAGNOSIS — Z96649 Presence of unspecified artificial hip joint: Secondary | ICD-10-CM | POA: Diagnosis not present

## 2012-07-30 DIAGNOSIS — Z8701 Personal history of pneumonia (recurrent): Secondary | ICD-10-CM | POA: Diagnosis not present

## 2012-07-30 DIAGNOSIS — Z471 Aftercare following joint replacement surgery: Secondary | ICD-10-CM | POA: Diagnosis not present

## 2012-07-30 DIAGNOSIS — IMO0001 Reserved for inherently not codable concepts without codable children: Secondary | ICD-10-CM | POA: Diagnosis not present

## 2012-07-30 DIAGNOSIS — I10 Essential (primary) hypertension: Secondary | ICD-10-CM | POA: Diagnosis not present

## 2012-08-01 DIAGNOSIS — IMO0001 Reserved for inherently not codable concepts without codable children: Secondary | ICD-10-CM | POA: Diagnosis not present

## 2012-08-01 DIAGNOSIS — I10 Essential (primary) hypertension: Secondary | ICD-10-CM | POA: Diagnosis not present

## 2012-08-01 DIAGNOSIS — Z96649 Presence of unspecified artificial hip joint: Secondary | ICD-10-CM | POA: Diagnosis not present

## 2012-08-01 DIAGNOSIS — Z471 Aftercare following joint replacement surgery: Secondary | ICD-10-CM | POA: Diagnosis not present

## 2012-08-01 DIAGNOSIS — Z8701 Personal history of pneumonia (recurrent): Secondary | ICD-10-CM | POA: Diagnosis not present

## 2012-08-03 DIAGNOSIS — IMO0001 Reserved for inherently not codable concepts without codable children: Secondary | ICD-10-CM | POA: Diagnosis not present

## 2012-08-03 DIAGNOSIS — Z471 Aftercare following joint replacement surgery: Secondary | ICD-10-CM | POA: Diagnosis not present

## 2012-08-03 DIAGNOSIS — Z8701 Personal history of pneumonia (recurrent): Secondary | ICD-10-CM | POA: Diagnosis not present

## 2012-08-03 DIAGNOSIS — I10 Essential (primary) hypertension: Secondary | ICD-10-CM | POA: Diagnosis not present

## 2012-08-03 DIAGNOSIS — Z96649 Presence of unspecified artificial hip joint: Secondary | ICD-10-CM | POA: Diagnosis not present

## 2012-08-06 DIAGNOSIS — Z8701 Personal history of pneumonia (recurrent): Secondary | ICD-10-CM | POA: Diagnosis not present

## 2012-08-06 DIAGNOSIS — Z96649 Presence of unspecified artificial hip joint: Secondary | ICD-10-CM | POA: Diagnosis not present

## 2012-08-06 DIAGNOSIS — Z471 Aftercare following joint replacement surgery: Secondary | ICD-10-CM | POA: Diagnosis not present

## 2012-08-06 DIAGNOSIS — IMO0001 Reserved for inherently not codable concepts without codable children: Secondary | ICD-10-CM | POA: Diagnosis not present

## 2012-08-06 DIAGNOSIS — I10 Essential (primary) hypertension: Secondary | ICD-10-CM | POA: Diagnosis not present

## 2012-08-08 DIAGNOSIS — Z8701 Personal history of pneumonia (recurrent): Secondary | ICD-10-CM | POA: Diagnosis not present

## 2012-08-08 DIAGNOSIS — I10 Essential (primary) hypertension: Secondary | ICD-10-CM | POA: Diagnosis not present

## 2012-08-08 DIAGNOSIS — Z471 Aftercare following joint replacement surgery: Secondary | ICD-10-CM | POA: Diagnosis not present

## 2012-08-08 DIAGNOSIS — IMO0001 Reserved for inherently not codable concepts without codable children: Secondary | ICD-10-CM | POA: Diagnosis not present

## 2012-08-08 DIAGNOSIS — Z96649 Presence of unspecified artificial hip joint: Secondary | ICD-10-CM | POA: Diagnosis not present

## 2012-08-10 DIAGNOSIS — Z8701 Personal history of pneumonia (recurrent): Secondary | ICD-10-CM | POA: Diagnosis not present

## 2012-08-10 DIAGNOSIS — IMO0001 Reserved for inherently not codable concepts without codable children: Secondary | ICD-10-CM | POA: Diagnosis not present

## 2012-08-10 DIAGNOSIS — Z471 Aftercare following joint replacement surgery: Secondary | ICD-10-CM | POA: Diagnosis not present

## 2012-08-10 DIAGNOSIS — Z96649 Presence of unspecified artificial hip joint: Secondary | ICD-10-CM | POA: Diagnosis not present

## 2012-08-10 DIAGNOSIS — I10 Essential (primary) hypertension: Secondary | ICD-10-CM | POA: Diagnosis not present

## 2012-08-13 DIAGNOSIS — I10 Essential (primary) hypertension: Secondary | ICD-10-CM | POA: Diagnosis not present

## 2012-08-13 DIAGNOSIS — IMO0001 Reserved for inherently not codable concepts without codable children: Secondary | ICD-10-CM | POA: Diagnosis not present

## 2012-08-13 DIAGNOSIS — Z96649 Presence of unspecified artificial hip joint: Secondary | ICD-10-CM | POA: Diagnosis not present

## 2012-08-13 DIAGNOSIS — Z471 Aftercare following joint replacement surgery: Secondary | ICD-10-CM | POA: Diagnosis not present

## 2012-08-13 DIAGNOSIS — Z8701 Personal history of pneumonia (recurrent): Secondary | ICD-10-CM | POA: Diagnosis not present

## 2012-08-15 DIAGNOSIS — IMO0001 Reserved for inherently not codable concepts without codable children: Secondary | ICD-10-CM | POA: Diagnosis not present

## 2012-08-15 DIAGNOSIS — Z8701 Personal history of pneumonia (recurrent): Secondary | ICD-10-CM | POA: Diagnosis not present

## 2012-08-15 DIAGNOSIS — I10 Essential (primary) hypertension: Secondary | ICD-10-CM | POA: Diagnosis not present

## 2012-08-15 DIAGNOSIS — Z96649 Presence of unspecified artificial hip joint: Secondary | ICD-10-CM | POA: Diagnosis not present

## 2012-08-15 DIAGNOSIS — Z471 Aftercare following joint replacement surgery: Secondary | ICD-10-CM | POA: Diagnosis not present

## 2012-08-16 DIAGNOSIS — I10 Essential (primary) hypertension: Secondary | ICD-10-CM | POA: Diagnosis not present

## 2012-08-16 DIAGNOSIS — Z8701 Personal history of pneumonia (recurrent): Secondary | ICD-10-CM | POA: Diagnosis not present

## 2012-08-16 DIAGNOSIS — Z471 Aftercare following joint replacement surgery: Secondary | ICD-10-CM | POA: Diagnosis not present

## 2012-08-16 DIAGNOSIS — Z96649 Presence of unspecified artificial hip joint: Secondary | ICD-10-CM | POA: Diagnosis not present

## 2012-08-16 DIAGNOSIS — IMO0001 Reserved for inherently not codable concepts without codable children: Secondary | ICD-10-CM | POA: Diagnosis not present

## 2012-08-17 DIAGNOSIS — Z96649 Presence of unspecified artificial hip joint: Secondary | ICD-10-CM | POA: Diagnosis not present

## 2012-08-22 DIAGNOSIS — K648 Other hemorrhoids: Secondary | ICD-10-CM | POA: Diagnosis not present

## 2012-09-28 DIAGNOSIS — Z96649 Presence of unspecified artificial hip joint: Secondary | ICD-10-CM | POA: Diagnosis not present

## 2012-10-03 DIAGNOSIS — I10 Essential (primary) hypertension: Secondary | ICD-10-CM | POA: Diagnosis not present

## 2012-10-03 DIAGNOSIS — B370 Candidal stomatitis: Secondary | ICD-10-CM | POA: Diagnosis not present

## 2012-10-03 DIAGNOSIS — E7800 Pure hypercholesterolemia, unspecified: Secondary | ICD-10-CM | POA: Diagnosis not present

## 2012-10-11 DIAGNOSIS — H33059 Total retinal detachment, unspecified eye: Secondary | ICD-10-CM | POA: Diagnosis not present

## 2012-10-11 DIAGNOSIS — H04209 Unspecified epiphora, unspecified lacrimal gland: Secondary | ICD-10-CM | POA: Diagnosis not present

## 2012-11-28 DIAGNOSIS — H5210 Myopia, unspecified eye: Secondary | ICD-10-CM | POA: Diagnosis not present

## 2012-11-28 DIAGNOSIS — Z961 Presence of intraocular lens: Secondary | ICD-10-CM | POA: Diagnosis not present

## 2012-11-28 DIAGNOSIS — H33309 Unspecified retinal break, unspecified eye: Secondary | ICD-10-CM | POA: Diagnosis not present

## 2012-11-28 DIAGNOSIS — H52229 Regular astigmatism, unspecified eye: Secondary | ICD-10-CM | POA: Diagnosis not present

## 2012-12-05 DIAGNOSIS — Z1231 Encounter for screening mammogram for malignant neoplasm of breast: Secondary | ICD-10-CM | POA: Diagnosis not present

## 2013-01-07 DIAGNOSIS — Z85828 Personal history of other malignant neoplasm of skin: Secondary | ICD-10-CM | POA: Diagnosis not present

## 2013-01-07 DIAGNOSIS — C44211 Basal cell carcinoma of skin of unspecified ear and external auricular canal: Secondary | ICD-10-CM | POA: Diagnosis not present

## 2013-01-07 DIAGNOSIS — D485 Neoplasm of uncertain behavior of skin: Secondary | ICD-10-CM | POA: Diagnosis not present

## 2013-01-07 DIAGNOSIS — C44221 Squamous cell carcinoma of skin of unspecified ear and external auricular canal: Secondary | ICD-10-CM | POA: Diagnosis not present

## 2013-01-07 DIAGNOSIS — L570 Actinic keratosis: Secondary | ICD-10-CM | POA: Diagnosis not present

## 2013-01-07 DIAGNOSIS — D1801 Hemangioma of skin and subcutaneous tissue: Secondary | ICD-10-CM | POA: Diagnosis not present

## 2013-01-14 ENCOUNTER — Ambulatory Visit (INDEPENDENT_AMBULATORY_CARE_PROVIDER_SITE_OTHER): Payer: PRIVATE HEALTH INSURANCE | Admitting: Ophthalmology

## 2013-02-01 ENCOUNTER — Ambulatory Visit (INDEPENDENT_AMBULATORY_CARE_PROVIDER_SITE_OTHER): Payer: Medicare Other | Admitting: Ophthalmology

## 2013-02-01 DIAGNOSIS — H33009 Unspecified retinal detachment with retinal break, unspecified eye: Secondary | ICD-10-CM

## 2013-02-01 DIAGNOSIS — I10 Essential (primary) hypertension: Secondary | ICD-10-CM

## 2013-02-01 DIAGNOSIS — H35039 Hypertensive retinopathy, unspecified eye: Secondary | ICD-10-CM | POA: Diagnosis not present

## 2013-02-01 DIAGNOSIS — H35379 Puckering of macula, unspecified eye: Secondary | ICD-10-CM

## 2013-02-01 DIAGNOSIS — H43819 Vitreous degeneration, unspecified eye: Secondary | ICD-10-CM

## 2013-04-01 DIAGNOSIS — IMO0002 Reserved for concepts with insufficient information to code with codable children: Secondary | ICD-10-CM | POA: Diagnosis not present

## 2013-04-01 DIAGNOSIS — T148XXA Other injury of unspecified body region, initial encounter: Secondary | ICD-10-CM | POA: Diagnosis not present

## 2013-07-12 DIAGNOSIS — R059 Cough, unspecified: Secondary | ICD-10-CM | POA: Diagnosis not present

## 2013-07-12 DIAGNOSIS — R05 Cough: Secondary | ICD-10-CM | POA: Diagnosis not present

## 2013-08-01 DIAGNOSIS — M25559 Pain in unspecified hip: Secondary | ICD-10-CM | POA: Diagnosis not present

## 2013-10-24 DIAGNOSIS — E7800 Pure hypercholesterolemia, unspecified: Secondary | ICD-10-CM | POA: Diagnosis not present

## 2013-10-24 DIAGNOSIS — I10 Essential (primary) hypertension: Secondary | ICD-10-CM | POA: Diagnosis not present

## 2013-12-06 DIAGNOSIS — H33009 Unspecified retinal detachment with retinal break, unspecified eye: Secondary | ICD-10-CM | POA: Diagnosis not present

## 2013-12-06 DIAGNOSIS — H43819 Vitreous degeneration, unspecified eye: Secondary | ICD-10-CM | POA: Diagnosis not present

## 2013-12-06 DIAGNOSIS — Z961 Presence of intraocular lens: Secondary | ICD-10-CM | POA: Diagnosis not present

## 2013-12-06 DIAGNOSIS — Z9849 Cataract extraction status, unspecified eye: Secondary | ICD-10-CM | POA: Diagnosis not present

## 2013-12-09 DIAGNOSIS — Z1231 Encounter for screening mammogram for malignant neoplasm of breast: Secondary | ICD-10-CM | POA: Diagnosis not present

## 2014-01-06 DIAGNOSIS — Z85828 Personal history of other malignant neoplasm of skin: Secondary | ICD-10-CM | POA: Diagnosis not present

## 2014-01-06 DIAGNOSIS — D1801 Hemangioma of skin and subcutaneous tissue: Secondary | ICD-10-CM | POA: Diagnosis not present

## 2014-01-06 DIAGNOSIS — L219 Seborrheic dermatitis, unspecified: Secondary | ICD-10-CM | POA: Diagnosis not present

## 2014-01-06 DIAGNOSIS — L570 Actinic keratosis: Secondary | ICD-10-CM | POA: Diagnosis not present

## 2014-01-06 DIAGNOSIS — D692 Other nonthrombocytopenic purpura: Secondary | ICD-10-CM | POA: Diagnosis not present

## 2014-02-10 ENCOUNTER — Ambulatory Visit (INDEPENDENT_AMBULATORY_CARE_PROVIDER_SITE_OTHER): Payer: Medicare Other | Admitting: Ophthalmology

## 2014-05-09 ENCOUNTER — Encounter: Payer: Self-pay | Admitting: *Deleted

## 2014-05-26 DIAGNOSIS — R05 Cough: Secondary | ICD-10-CM | POA: Diagnosis not present

## 2014-05-26 DIAGNOSIS — R059 Cough, unspecified: Secondary | ICD-10-CM | POA: Diagnosis not present

## 2014-10-29 DIAGNOSIS — I10 Essential (primary) hypertension: Secondary | ICD-10-CM | POA: Diagnosis not present

## 2014-10-29 DIAGNOSIS — E780 Pure hypercholesterolemia: Secondary | ICD-10-CM | POA: Diagnosis not present

## 2014-10-29 DIAGNOSIS — Z0001 Encounter for general adult medical examination with abnormal findings: Secondary | ICD-10-CM | POA: Diagnosis not present

## 2014-10-29 DIAGNOSIS — I679 Cerebrovascular disease, unspecified: Secondary | ICD-10-CM | POA: Diagnosis not present

## 2014-10-29 DIAGNOSIS — M179 Osteoarthritis of knee, unspecified: Secondary | ICD-10-CM | POA: Diagnosis not present

## 2014-10-29 DIAGNOSIS — D649 Anemia, unspecified: Secondary | ICD-10-CM | POA: Diagnosis not present

## 2014-12-17 DIAGNOSIS — H5213 Myopia, bilateral: Secondary | ICD-10-CM | POA: Diagnosis not present

## 2014-12-17 DIAGNOSIS — H35373 Puckering of macula, bilateral: Secondary | ICD-10-CM | POA: Diagnosis not present

## 2014-12-17 DIAGNOSIS — H43829 Vitreomacular adhesion, unspecified eye: Secondary | ICD-10-CM | POA: Diagnosis not present

## 2014-12-17 DIAGNOSIS — H35359 Cystoid macular degeneration, unspecified eye: Secondary | ICD-10-CM | POA: Diagnosis not present

## 2014-12-17 DIAGNOSIS — H33309 Unspecified retinal break, unspecified eye: Secondary | ICD-10-CM | POA: Diagnosis not present

## 2014-12-17 DIAGNOSIS — H52223 Regular astigmatism, bilateral: Secondary | ICD-10-CM | POA: Diagnosis not present

## 2014-12-22 DIAGNOSIS — Z1231 Encounter for screening mammogram for malignant neoplasm of breast: Secondary | ICD-10-CM | POA: Diagnosis not present

## 2015-01-07 DIAGNOSIS — D485 Neoplasm of uncertain behavior of skin: Secondary | ICD-10-CM | POA: Diagnosis not present

## 2015-01-07 DIAGNOSIS — C44212 Basal cell carcinoma of skin of right ear and external auricular canal: Secondary | ICD-10-CM | POA: Diagnosis not present

## 2015-01-07 DIAGNOSIS — L570 Actinic keratosis: Secondary | ICD-10-CM | POA: Diagnosis not present

## 2015-01-07 DIAGNOSIS — Z85828 Personal history of other malignant neoplasm of skin: Secondary | ICD-10-CM | POA: Diagnosis not present

## 2015-01-20 DIAGNOSIS — C44212 Basal cell carcinoma of skin of right ear and external auricular canal: Secondary | ICD-10-CM | POA: Diagnosis not present

## 2015-01-20 DIAGNOSIS — Z85828 Personal history of other malignant neoplasm of skin: Secondary | ICD-10-CM | POA: Diagnosis not present

## 2015-02-13 ENCOUNTER — Ambulatory Visit (INDEPENDENT_AMBULATORY_CARE_PROVIDER_SITE_OTHER): Payer: Medicare Other | Admitting: Ophthalmology

## 2015-02-13 DIAGNOSIS — H35033 Hypertensive retinopathy, bilateral: Secondary | ICD-10-CM

## 2015-02-13 DIAGNOSIS — H43813 Vitreous degeneration, bilateral: Secondary | ICD-10-CM | POA: Diagnosis not present

## 2015-02-13 DIAGNOSIS — I10 Essential (primary) hypertension: Secondary | ICD-10-CM

## 2015-02-13 DIAGNOSIS — H338 Other retinal detachments: Secondary | ICD-10-CM

## 2015-02-13 DIAGNOSIS — H35373 Puckering of macula, bilateral: Secondary | ICD-10-CM | POA: Diagnosis not present

## 2015-07-23 DIAGNOSIS — H524 Presbyopia: Secondary | ICD-10-CM | POA: Diagnosis not present

## 2015-07-23 DIAGNOSIS — H52223 Regular astigmatism, bilateral: Secondary | ICD-10-CM | POA: Diagnosis not present

## 2015-07-23 DIAGNOSIS — H35372 Puckering of macula, left eye: Secondary | ICD-10-CM | POA: Diagnosis not present

## 2015-07-24 ENCOUNTER — Encounter (INDEPENDENT_AMBULATORY_CARE_PROVIDER_SITE_OTHER): Payer: Medicare Other | Admitting: Ophthalmology

## 2015-07-24 DIAGNOSIS — H35372 Puckering of macula, left eye: Secondary | ICD-10-CM | POA: Diagnosis not present

## 2015-07-24 DIAGNOSIS — H338 Other retinal detachments: Secondary | ICD-10-CM

## 2015-07-24 DIAGNOSIS — I10 Essential (primary) hypertension: Secondary | ICD-10-CM

## 2015-07-24 DIAGNOSIS — H43813 Vitreous degeneration, bilateral: Secondary | ICD-10-CM

## 2015-07-24 DIAGNOSIS — H35033 Hypertensive retinopathy, bilateral: Secondary | ICD-10-CM | POA: Diagnosis not present

## 2015-08-06 DIAGNOSIS — H35372 Puckering of macula, left eye: Secondary | ICD-10-CM | POA: Diagnosis present

## 2015-08-06 NOTE — H&P (Signed)
[name] is an 81 y.o. female.   Chief Complaint:poor vision left eye HPI: Had severe RRD OS in 2011 with scleral buckle repair.  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 PONV (postoperative nausea and vomiting)   . Hypertension   . Arthritis   . History of blood transfusion     Past Surgical History  Procedure Laterality Date  . Cholecystectomy    . Breast surgery  1960    cystectomy bilateral  . Eye surgery      bilateral cataract extraction,   laser surgery  . Detatched retina    . Mohs surgery      lower lip  . Appendectomy    . Tubal ligation    . Abdominal hysterectomy    . Joint replacement      right knee  . Total hip arthroplasty  07/09/2012    Procedure: TOTAL HIP ARTHROPLASTY;  Surgeon: Gearlean Alf, MD;  Location: [facility];  Service: Orthopedics;  Laterality: Right;    Family History  Problem Relation Age of Onset  . Cancer Father    Social History:  reports that she has never smoked. She has never used smokeless tobacco. She reports that she does not drink alcohol. Her drug history is not on file.  Allergies: No Known Allergies  No prescriptions prior to admission    Review of systems otherwise negative  There were no vitals taken for this visit.  Physical exam: Mental status: oriented x3. Eyes: See eye exam associated with this date of surgery in media tab.  Scanned in by scanning center Ears, Nose, Throat: within normal limits Neck: Within Normal limits General: within normal limits Chest: Within normal limits Breast: deferred Heart: Within normal limits Abdomen: Within normal limits GU: deferred Extremities: within normal limits Skin: within normal limits  Assessment/Plan Preretinal fibrosis left eye.  History of retinal detachment left eye Plan: To [facility] for Pars plana vitrectomy, laser, membrane peel, gas injection left eye  MATTHEWS, JOHN D 08/06/2015, 7:31 AM

## 2015-08-18 DIAGNOSIS — M179 Osteoarthritis of knee, unspecified: Secondary | ICD-10-CM | POA: Diagnosis not present

## 2015-08-18 DIAGNOSIS — H35379 Puckering of macula, unspecified eye: Secondary | ICD-10-CM | POA: Diagnosis not present

## 2015-08-31 ENCOUNTER — Encounter (HOSPITAL_COMMUNITY): Payer: Self-pay | Admitting: *Deleted

## 2015-08-31 ENCOUNTER — Encounter (HOSPITAL_COMMUNITY): Payer: Self-pay | Admitting: Pharmacy Technician

## 2015-08-31 MED ORDER — CEFAZOLIN SODIUM-DEXTROSE 2-3 GM-% IV SOLR
2.0000 g | INTRAVENOUS | Status: AC
  Administered 2015-09-01: 2 g via INTRAVENOUS
  Filled 2015-08-31: qty 50

## 2015-08-31 MED ORDER — TROPICAMIDE 1 % OP SOLN: 1.0000 [drp] | OPHTHALMIC | Status: DC

## 2015-08-31 MED ORDER — PHENYLEPHRINE HCL 2.5 % OP SOLN: 1.0000 [drp] | OPHTHALMIC | Status: DC

## 2015-08-31 MED ORDER — GATIFLOXACIN 0.5 % OP SOLN: 1.0000 [drp] | OPHTHALMIC | Status: DC

## 2015-08-31 MED ORDER — CYCLOPENTOLATE HCL 1 % OP SOLN: 1.0000 [drp] | OPHTHALMIC | Status: DC

## 2015-08-31 NOTE — Progress Notes (Signed)
Pt denies SOB, chest pain, and being under the care of a cardiologist. Pt denies having a stress test, echo, and cardiac cath.  Pt denies having an EKG and chest x ray. Pt made aware to stop taking otc vitamins and herbal medications. Pt verbalized understanding of all pre-op instructions.

## 2015-09-01 ENCOUNTER — Ambulatory Visit (HOSPITAL_COMMUNITY): Payer: Medicare Other | Admitting: Certified Registered Nurse Anesthetist

## 2015-09-01 ENCOUNTER — Encounter (INDEPENDENT_AMBULATORY_CARE_PROVIDER_SITE_OTHER): Payer: Medicare Other | Admitting: Ophthalmology

## 2015-09-01 ENCOUNTER — Encounter (HOSPITAL_COMMUNITY): Payer: Self-pay | Admitting: Certified Registered Nurse Anesthetist

## 2015-09-01 ENCOUNTER — Encounter (HOSPITAL_COMMUNITY)
Admission: RE | Disposition: A | Discharge status: Home / Self Care | Payer: Self-pay | Source: Ambulatory Visit | Attending: Ophthalmology

## 2015-09-01 ENCOUNTER — Ambulatory Visit (HOSPITAL_COMMUNITY)
Admission: RE | Admit: 2015-09-01 | Discharge: 2015-09-02 | Disposition: A | Discharge status: Home / Self Care | Payer: Medicare Other | Source: Ambulatory Visit | Attending: Ophthalmology | Admitting: Ophthalmology

## 2015-09-01 DIAGNOSIS — I10 Essential (primary) hypertension: Secondary | ICD-10-CM | POA: Diagnosis not present

## 2015-09-01 DIAGNOSIS — Z885 Allergy status to narcotic agent status: Secondary | ICD-10-CM | POA: Insufficient documentation

## 2015-09-01 DIAGNOSIS — H35372 Puckering of macula, left eye: Secondary | ICD-10-CM

## 2015-09-01 DIAGNOSIS — Z96651 Presence of right artificial knee joint: Secondary | ICD-10-CM | POA: Insufficient documentation

## 2015-09-01 DIAGNOSIS — M1990 Unspecified osteoarthritis, unspecified site: Secondary | ICD-10-CM | POA: Insufficient documentation

## 2015-09-01 DIAGNOSIS — H35033 Hypertensive retinopathy, bilateral: Secondary | ICD-10-CM

## 2015-09-01 DIAGNOSIS — H43813 Vitreous degeneration, bilateral: Secondary | ICD-10-CM

## 2015-09-01 DIAGNOSIS — H35379 Puckering of macula, unspecified eye: Secondary | ICD-10-CM | POA: Diagnosis present

## 2015-09-01 HISTORY — PX: LASER PHOTO ABLATION: SHX5942

## 2015-09-01 HISTORY — DX: Pneumonia, unspecified organism: J18.9

## 2015-09-01 HISTORY — DX: Hyperlipidemia, unspecified: E78.5

## 2015-09-01 HISTORY — PX: MEMBRANE PEEL: SHX5967

## 2015-09-01 HISTORY — PX: 25 GAUGE PARS PLANA VITRECTOMY WITH 20 GAUGE MVR PORT: SHX6041

## 2015-09-01 HISTORY — PX: AIR/FLUID EXCHANGE: SHX6494

## 2015-09-01 HISTORY — DX: Squamous cell carcinoma of skin of lip: C44.02

## 2015-09-01 LAB — BASIC METABOLIC PANEL
ANION GAP: 14 (ref 5–15)
BUN: 15 mg/dL (ref 6–20)
CALCIUM: 9.4 mg/dL (ref 8.9–10.3)
CHLORIDE: 99 mmol/L — AB (ref 101–111)
CO2: 22 mmol/L (ref 22–32)
Creatinine, Ser: 0.86 mg/dL (ref 0.44–1.00)
GFR calc non Af Amer: >60 mL/min (ref >60)
GLUCOSE: 102 mg/dL — AB (ref 65–99)
POTASSIUM: 4.1 mmol/L (ref 3.5–5.1)
Sodium: 135 mmol/L (ref 135–145)

## 2015-09-01 LAB — CBC
HEMATOCRIT: 35.2 % — AB (ref 36.0–46.0)
HEMOGLOBIN: 11.8 g/dL — AB (ref 12.0–15.0)
MCH: 30.7 pg (ref 26.0–34.0)
MCHC: 33.5 g/dL (ref 30.0–36.0)
MCV: 91.7 fL (ref 78.0–100.0)
Platelets: 325 10*3/uL (ref 150–400)
RBC: 3.84 MIL/uL — AB (ref 3.87–5.11)
RDW: 13.4 % (ref 11.5–15.5)
WBC: 6.6 10*3/uL (ref 4.0–10.5)

## 2015-09-01 SURGERY — 25 GAUGE PARS PLANA VITRECTOMY WITH 20 GAUGE MVR PORT
Anesthesia: General | Site: Eye | Laterality: Left

## 2015-09-01 MED ORDER — CEFTAZIDIME 1 G IJ SOLR
INTRAMUSCULAR | Status: AC
  Filled 2015-09-01: qty 1

## 2015-09-01 MED ORDER — ONDANSETRON HCL 4 MG/2ML IJ SOLN
INTRAMUSCULAR | Status: AC
  Filled 2015-09-01: qty 2

## 2015-09-01 MED ORDER — ADULT MULTIVITAMIN W/MINERALS CH: 1.0000 | ORAL_TABLET | Freq: Every day | ORAL | Status: DC

## 2015-09-01 MED ORDER — PROMETHAZINE HCL 25 MG/ML IJ SOLN: 6.2500 mg | INTRAMUSCULAR | Status: DC | PRN

## 2015-09-01 MED ORDER — TEMAZEPAM 15 MG PO CAPS: 15.0000 mg | ORAL_CAPSULE | Freq: Every evening | ORAL | Status: DC | PRN

## 2015-09-01 MED ORDER — LATANOPROST 0.005 % OP SOLN
1.0000 [drp] | Freq: Every day | OPHTHALMIC | Status: DC
  Filled 2015-09-01: qty 2.5

## 2015-09-01 MED ORDER — BACITRACIN-POLYMYXIN B 500-10000 UNIT/GM OP OINT
TOPICAL_OINTMENT | OPHTHALMIC | Status: AC
  Filled 2015-09-01: qty 3.5

## 2015-09-01 MED ORDER — MAGNESIUM HYDROXIDE 400 MG/5ML PO SUSP: 15.0000 mL | Freq: Four times a day (QID) | ORAL | Status: DC | PRN

## 2015-09-01 MED ORDER — BACITRACIN-POLYMYXIN B 500-10000 UNIT/GM OP OINT
1.0000 "application " | TOPICAL_OINTMENT | Freq: Three times a day (TID) | OPHTHALMIC | Status: DC
  Filled 2015-09-01: qty 3.5

## 2015-09-01 MED ORDER — BACITRACIN-POLYMYXIN B 500-10000 UNIT/GM OP OINT
TOPICAL_OINTMENT | OPHTHALMIC | Status: DC | PRN
  Administered 2015-09-01: 1 via OPHTHALMIC

## 2015-09-01 MED ORDER — LIDOCAINE HCL 4 % MT SOLN
OROMUCOSAL | Status: DC | PRN
  Administered 2015-09-01: 4 mL via TOPICAL

## 2015-09-01 MED ORDER — BUPIVACAINE HCL (PF) 0.75 % IJ SOLN
INTRAMUSCULAR | Status: DC | PRN
  Administered 2015-09-01: 10 mL

## 2015-09-01 MED ORDER — ARTIFICIAL TEARS OP OINT
TOPICAL_OINTMENT | OPHTHALMIC | Status: AC
  Filled 2015-09-01: qty 3.5

## 2015-09-01 MED ORDER — PROPOFOL 10 MG/ML IV BOLUS
INTRAVENOUS | Status: AC
  Filled 2015-09-01: qty 20

## 2015-09-01 MED ORDER — LIDOCAINE HCL (CARDIAC) 20 MG/ML IV SOLN
INTRAVENOUS | Status: DC | PRN
  Administered 2015-09-01: 40 mg via INTRAVENOUS

## 2015-09-01 MED ORDER — LIDOCAINE HCL (CARDIAC) 20 MG/ML IV SOLN
INTRAVENOUS | Status: AC
  Filled 2015-09-01: qty 5

## 2015-09-01 MED ORDER — GATIFLOXACIN 0.5 % OP SOLN
1.0000 [drp] | OPHTHALMIC | Status: AC
Start: 2015-09-01 — End: 2015-09-01
  Administered 2015-09-01 (×3): 1 [drp] via OPHTHALMIC
  Filled 2015-09-01: qty 2.5

## 2015-09-01 MED ORDER — BSS PLUS IO SOLN
INTRAOCULAR | Status: AC
  Filled 2015-09-01: qty 500

## 2015-09-01 MED ORDER — PHENYLEPHRINE HCL 2.5 % OP SOLN
1.0000 [drp] | OPHTHALMIC | Status: AC
  Administered 2015-09-01 (×3): 1 [drp] via OPHTHALMIC
  Filled 2015-09-01: qty 2

## 2015-09-01 MED ORDER — DEXAMETHASONE SODIUM PHOSPHATE 4 MG/ML IJ SOLN
INTRAMUSCULAR | Status: DC | PRN
  Administered 2015-09-01: 4 mg via INTRAVENOUS

## 2015-09-01 MED ORDER — STERILE WATER FOR INJECTION IJ SOLN
INTRAMUSCULAR | Status: DC | PRN
  Administered 2015-09-01: 10 mL

## 2015-09-01 MED ORDER — TRIAMTERENE-HCTZ 37.5-25 MG PO TABS
1.0000 | ORAL_TABLET | Freq: Every day | ORAL | Status: DC
  Administered 2015-09-01: 1 via ORAL
  Filled 2015-09-01 (×2): qty 1

## 2015-09-01 MED ORDER — SODIUM CHLORIDE 0.9 % IV SOLN
INTRAVENOUS | Status: DC
  Administered 2015-09-01: 13:00:00 via INTRAVENOUS
  Administered 2015-09-01: 10 mL/h via INTRAVENOUS
  Administered 2015-09-01: 12:00:00 via INTRAVENOUS

## 2015-09-01 MED ORDER — EPINEPHRINE HCL 1 MG/ML IJ SOLN
INTRAMUSCULAR | Status: AC
  Filled 2015-09-01: qty 1

## 2015-09-01 MED ORDER — PROPYLENE GLYCOL 0.6 % OP SOLN: Freq: Every day | OPHTHALMIC | Status: DC | PRN

## 2015-09-01 MED ORDER — MIDAZOLAM HCL 2 MG/2ML IJ SOLN: 0.5000 mg | Freq: Once | INTRAMUSCULAR | Status: DC | PRN

## 2015-09-01 MED ORDER — SODIUM CHLORIDE 0.45 % IV SOLN
INTRAVENOUS | Status: DC
  Administered 2015-09-01: 16:00:00 via INTRAVENOUS

## 2015-09-01 MED ORDER — POLYVINYL ALCOHOL 1.4 % OP SOLN
1.0000 [drp] | OPHTHALMIC | Status: DC | PRN
  Filled 2015-09-01: qty 15

## 2015-09-01 MED ORDER — HYPROMELLOSE (GONIOSCOPIC) 2.5 % OP SOLN
OPHTHALMIC | Status: AC
  Filled 2015-09-01: qty 15

## 2015-09-01 MED ORDER — SODIUM HYALURONATE 10 MG/ML IO SOLN
INTRAOCULAR | Status: DC | PRN
  Administered 2015-09-01: 0.85 mL via INTRAOCULAR

## 2015-09-01 MED ORDER — FENTANYL CITRATE (PF) 100 MCG/2ML IJ SOLN
25.0000 ug | INTRAMUSCULAR | Status: DC | PRN
  Administered 2015-09-01 (×2): 25 ug via INTRAVENOUS

## 2015-09-01 MED ORDER — SUCCINYLCHOLINE CHLORIDE 20 MG/ML IJ SOLN
INTRAMUSCULAR | Status: DC | PRN
  Administered 2015-09-01: 100 mg via INTRAVENOUS

## 2015-09-01 MED ORDER — ONDANSETRON HCL 4 MG/2ML IJ SOLN
INTRAMUSCULAR | Status: DC | PRN
  Administered 2015-09-01: 4 mg via INTRAVENOUS

## 2015-09-01 MED ORDER — TRIAMCINOLONE ACETONIDE 40 MG/ML IJ SUSP
INTRAMUSCULAR | Status: AC
  Filled 2015-09-01: qty 5

## 2015-09-01 MED ORDER — STERILE WATER FOR IRRIGATION IR SOLN
Status: DC | PRN
  Administered 2015-09-01: 200 mL

## 2015-09-01 MED ORDER — HYALURONIDASE HUMAN 150 UNIT/ML IJ SOLN
INTRAMUSCULAR | Status: AC
  Filled 2015-09-01: qty 1

## 2015-09-01 MED ORDER — GATIFLOXACIN 0.5 % OP SOLN
1.0000 [drp] | Freq: Four times a day (QID) | OPHTHALMIC | Status: DC
Start: 2015-09-02 — End: 2015-09-02
  Filled 2015-09-01: qty 2.5

## 2015-09-01 MED ORDER — HYDROCODONE-ACETAMINOPHEN 5-325 MG PO TABS: 1.0000 | ORAL_TABLET | ORAL | Status: DC | PRN

## 2015-09-01 MED ORDER — FENTANYL CITRATE (PF) 250 MCG/5ML IJ SOLN
INTRAMUSCULAR | Status: AC
  Filled 2015-09-01: qty 5

## 2015-09-01 MED ORDER — DEXAMETHASONE SODIUM PHOSPHATE 4 MG/ML IJ SOLN
INTRAMUSCULAR | Status: AC
  Filled 2015-09-01: qty 1

## 2015-09-01 MED ORDER — BUPIVACAINE HCL (PF) 0.75 % IJ SOLN
INTRAMUSCULAR | Status: AC
  Filled 2015-09-01: qty 10

## 2015-09-01 MED ORDER — STERILE WATER FOR INJECTION IJ SOLN
INTRAMUSCULAR | Status: AC
  Filled 2015-09-01: qty 20

## 2015-09-01 MED ORDER — SODIUM CHLORIDE 0.9 % IJ SOLN
INTRAMUSCULAR | Status: AC
  Filled 2015-09-01: qty 10

## 2015-09-01 MED ORDER — GLYCOPYRROLATE 0.2 MG/ML IJ SOLN
INTRAMUSCULAR | Status: AC
  Filled 2015-09-01: qty 3

## 2015-09-01 MED ORDER — SIMVASTATIN 20 MG PO TABS
20.0000 mg | ORAL_TABLET | Freq: Every day | ORAL | Status: DC
  Administered 2015-09-01: 20 mg via ORAL
  Filled 2015-09-01: qty 1

## 2015-09-01 MED ORDER — FENTANYL CITRATE (PF) 100 MCG/2ML IJ SOLN
INTRAMUSCULAR | Status: DC | PRN
  Administered 2015-09-01: 50 ug via INTRAVENOUS

## 2015-09-01 MED ORDER — MEPERIDINE HCL 25 MG/ML IJ SOLN: 6.2500 mg | INTRAMUSCULAR | Status: DC | PRN

## 2015-09-01 MED ORDER — TETRACAINE HCL 0.5 % OP SOLN
2.0000 [drp] | Freq: Once | OPHTHALMIC | Status: DC
  Filled 2015-09-01: qty 2

## 2015-09-01 MED ORDER — EPINEPHRINE HCL 1 MG/ML IJ SOLN
INTRAOCULAR | Status: DC | PRN
  Administered 2015-09-01: 500 mL

## 2015-09-01 MED ORDER — ACETAMINOPHEN 325 MG PO TABS
325.0000 mg | ORAL_TABLET | ORAL | Status: DC | PRN
  Administered 2015-09-01 – 2015-09-02 (×2): 650 mg via ORAL
  Filled 2015-09-01 (×2): qty 2

## 2015-09-01 MED ORDER — 0.9 % SODIUM CHLORIDE (POUR BTL) OPTIME
TOPICAL | Status: DC | PRN
  Administered 2015-09-01: 200 mL

## 2015-09-01 MED ORDER — ONDANSETRON HCL 4 MG/2ML IJ SOLN
4.0000 mg | Freq: Four times a day (QID) | INTRAMUSCULAR | Status: DC
Start: 2015-09-01 — End: 2015-09-02

## 2015-09-01 MED ORDER — CYCLOPENTOLATE HCL 1 % OP SOLN
1.0000 [drp] | OPHTHALMIC | Status: AC
  Administered 2015-09-01 (×3): 1 [drp] via OPHTHALMIC
  Filled 2015-09-01: qty 2

## 2015-09-01 MED ORDER — NEOSTIGMINE METHYLSULFATE 10 MG/10ML IV SOLN
INTRAVENOUS | Status: AC
  Filled 2015-09-01: qty 1

## 2015-09-01 MED ORDER — TROPICAMIDE 1 % OP SOLN
1.0000 [drp] | OPHTHALMIC | Status: AC
  Administered 2015-09-01 (×3): 1 [drp] via OPHTHALMIC
  Filled 2015-09-01: qty 3

## 2015-09-01 MED ORDER — PREDNISOLONE ACETATE 1 % OP SUSP
1.0000 [drp] | Freq: Four times a day (QID) | OPHTHALMIC | Status: DC
  Filled 2015-09-01: qty 5

## 2015-09-01 MED ORDER — ROCURONIUM BROMIDE 50 MG/5ML IV SOLN
INTRAVENOUS | Status: AC
  Filled 2015-09-01: qty 1

## 2015-09-01 MED ORDER — DEXAMETHASONE SODIUM PHOSPHATE 10 MG/ML IJ SOLN
INTRAMUSCULAR | Status: DC | PRN
  Administered 2015-09-01: 10 mg

## 2015-09-01 MED ORDER — PROPOFOL 10 MG/ML IV BOLUS
INTRAVENOUS | Status: DC | PRN
  Administered 2015-09-01: 100 mg via INTRAVENOUS

## 2015-09-01 MED ORDER — POLYMYXIN B SULFATE 500000 UNITS IJ SOLR
INTRAMUSCULAR | Status: AC
  Filled 2015-09-01: qty 1

## 2015-09-01 MED ORDER — SODIUM HYALURONATE 10 MG/ML IO SOLN
INTRAOCULAR | Status: AC
  Filled 2015-09-01: qty 0.85

## 2015-09-01 MED ORDER — MORPHINE SULFATE (PF) 2 MG/ML IV SOLN: 1.0000 mg | INTRAVENOUS | Status: DC | PRN

## 2015-09-01 MED ORDER — ACETAZOLAMIDE SODIUM 500 MG IJ SOLR
500.0000 mg | Freq: Once | INTRAMUSCULAR | Status: AC
  Administered 2015-09-02: 500 mg via INTRAVENOUS
  Filled 2015-09-01: qty 500

## 2015-09-01 MED ORDER — DEXAMETHASONE SODIUM PHOSPHATE 10 MG/ML IJ SOLN
INTRAMUSCULAR | Status: AC
  Filled 2015-09-01: qty 1

## 2015-09-01 MED ORDER — BRIMONIDINE TARTRATE 0.2 % OP SOLN
1.0000 [drp] | Freq: Two times a day (BID) | OPHTHALMIC | Status: DC
  Filled 2015-09-01: qty 5

## 2015-09-01 MED ORDER — PHENYLEPHRINE HCL 10 MG/ML IJ SOLN
INTRAMUSCULAR | Status: DC | PRN
  Administered 2015-09-01 (×6): 120 ug via INTRAVENOUS

## 2015-09-01 MED ORDER — FENTANYL CITRATE (PF) 100 MCG/2ML IJ SOLN
INTRAMUSCULAR | Status: AC
  Administered 2015-09-01: 25 ug via INTRAVENOUS
  Filled 2015-09-01: qty 2

## 2015-09-01 MED ORDER — ATROPINE SULFATE 1 % OP SOLN
OPHTHALMIC | Status: DC | PRN
  Administered 2015-09-01: 1 [drp] via OPHTHALMIC

## 2015-09-01 MED ORDER — ATROPINE SULFATE 1 % OP SOLN
OPHTHALMIC | Status: AC
  Filled 2015-09-01: qty 5

## 2015-09-01 SURGICAL SUPPLY — 70 items
APPLICATOR DR MATTHEWS STRL (MISCELLANEOUS) IMPLANT
BLADE EYE CATARACT 19 1.4 BEAV (BLADE) IMPLANT
BLADE MVR KNIFE 19G (BLADE) IMPLANT
BLADE MVR KNIFE 20G (BLADE) IMPLANT
CANNULA DUAL BORE 23G (CANNULA) IMPLANT
CANNULA VLV SOFT TIP 25GA (OPHTHALMIC) ×3 IMPLANT
CORDS BIPOLAR (ELECTRODE) ×3 IMPLANT
COTTONBALL LRG STERILE PKG (GAUZE/BANDAGES/DRESSINGS) ×9 IMPLANT
COVER MAYO STAND STRL (DRAPES) ×3 IMPLANT
DRAPE INCISE 51X51 W/FILM STRL (DRAPES) IMPLANT
DRAPE OPHTHALMIC 77X100 STRL (CUSTOM PROCEDURE TRAY) ×3 IMPLANT
FILTER BLUE MILLIPORE (MISCELLANEOUS) IMPLANT
FILTER STRAW FLUID ASPIR (MISCELLANEOUS) IMPLANT
FORCEPS ECKARDT ILM 25G SERR (OPHTHALMIC RELATED) IMPLANT
FORCEPS GRIESHABER ILM 25G A (INSTRUMENTS) IMPLANT
FORCEPS ILM 25G DSP TIP (MISCELLANEOUS) IMPLANT
GLOVE BIO SURGEON STRL SZ8.5 (GLOVE) ×6 IMPLANT
GLOVE BIOGEL PI IND STRL 8.5 (GLOVE) ×2 IMPLANT
GLOVE BIOGEL PI INDICATOR 8.5 (GLOVE) ×4
GLOVE SS BIOGEL STRL SZ 6.5 (GLOVE) ×1 IMPLANT
GLOVE SS BIOGEL STRL SZ 7 (GLOVE) ×1 IMPLANT
GLOVE SUPERSENSE BIOGEL SZ 6.5 (GLOVE) ×2
GLOVE SUPERSENSE BIOGEL SZ 7 (GLOVE) ×2
GLOVE SURG 8.5 LATEX PF (GLOVE) ×3 IMPLANT
GOWN STRL REUS W/ TWL LRG LVL3 (GOWN DISPOSABLE) ×3 IMPLANT
GOWN STRL REUS W/ TWL XL LVL3 (GOWN DISPOSABLE) ×2 IMPLANT
GOWN STRL REUS W/TWL LRG LVL3 (GOWN DISPOSABLE) ×6
GOWN STRL REUS W/TWL XL LVL3 (GOWN DISPOSABLE) ×4
HANDLE PNEUMATIC FOR CONSTEL (OPHTHALMIC) IMPLANT
KIT BASIN OR (CUSTOM PROCEDURE TRAY) ×3 IMPLANT
KIT ROOM TURNOVER OR (KITS) ×3 IMPLANT
MICROPICK 25G (MISCELLANEOUS)
NEEDLE 18GX1X1/2 (RX/OR ONLY) (NEEDLE) ×3 IMPLANT
NEEDLE 25GX 5/8IN NON SAFETY (NEEDLE) ×3 IMPLANT
NEEDLE 27GAX1X1/2 (NEEDLE) IMPLANT
NEEDLE FILTER BLUNT 18X 1/2SAF (NEEDLE) ×2
NEEDLE FILTER BLUNT 18X1 1/2 (NEEDLE) ×1 IMPLANT
NEEDLE HYPO 30X.5 LL (NEEDLE) ×6 IMPLANT
NS IRRIG 1000ML POUR BTL (IV SOLUTION) ×3 IMPLANT
PACK VITRECTOMY CUSTOM (CUSTOM PROCEDURE TRAY) ×3 IMPLANT
PAD ARMBOARD 7.5X6 YLW CONV (MISCELLANEOUS) ×6 IMPLANT
PAK PIK VITRECTOMY CVS 25GA (OPHTHALMIC) ×3 IMPLANT
PENCIL BIPOLAR 25GA STR DISP (OPHTHALMIC RELATED) IMPLANT
PIC ILLUMINATED 25G (OPHTHALMIC) ×3
PICK MICROPICK 25G (MISCELLANEOUS) IMPLANT
PIK ILLUMINATED 25G (OPHTHALMIC) ×1 IMPLANT
PROBE LASER ILLUM FLEX CVD 25G (OPHTHALMIC) ×3 IMPLANT
REPL STRA BRUSH NEEDLE (NEEDLE) IMPLANT
RESERVOIR BACK FLUSH (MISCELLANEOUS) IMPLANT
ROLLS DENTAL (MISCELLANEOUS) ×6 IMPLANT
SCRAPER DIAMOND 25GA (OPHTHALMIC RELATED) IMPLANT
SCRAPER DIAMOND DUST MEMBRANE (MISCELLANEOUS) ×3 IMPLANT
SPEAR EYE SURG WECK-CEL (MISCELLANEOUS) ×6 IMPLANT
SPONGE SURGIFOAM ABS GEL 12-7 (HEMOSTASIS) ×3 IMPLANT
STOPCOCK 4 WAY LG BORE MALE ST (IV SETS) IMPLANT
SUT CHROMIC 7 0 TG140 8 (SUTURE) IMPLANT
SUT ETHILON 10 0 CS140 6 (SUTURE) IMPLANT
SUT ETHILON 9 0 TG140 8 (SUTURE) ×3 IMPLANT
SUT POLY NON ABSORB 10-0 8 STR (SUTURE) IMPLANT
SUT SILK 4 0 RB 1 (SUTURE) IMPLANT
SUT VICRYL 7 0 TG140 8 (SUTURE) ×3 IMPLANT
SYR 20CC LL (SYRINGE) ×3 IMPLANT
SYR BULB 3OZ (MISCELLANEOUS) ×3 IMPLANT
SYR TB 1ML LUER SLIP (SYRINGE) ×3 IMPLANT
SYRINGE 10CC LL (SYRINGE) IMPLANT
TOWEL OR 17X24 6PK STRL BLUE (TOWEL DISPOSABLE) ×3 IMPLANT
TOWEL OR 17X26 10 PK STRL BLUE (TOWEL DISPOSABLE) ×3 IMPLANT
TUBING HIGH PRESS EXTEN 6IN (TUBING) IMPLANT
WATER STERILE IRR 1000ML POUR (IV SOLUTION) ×3 IMPLANT
WIPE INSTRUMENT VISIWIPE 73X73 (MISCELLANEOUS) ×3 IMPLANT

## 2015-09-01 NOTE — Anesthesia Postprocedure Evaluation (Signed)
Anesthesia Post Note  Patient: [name]  Procedure(s) Performed: Procedure(s) (LRB): 25 GAUGE PARS PLANA VITRECTOMY WITH 20 GAUGE MVR PORT (Left) LASER PHOTO ABLATION (Left) MEMBRANE PEEL (Left) AIR/FLUID EXCHANGE (Left)  Patient location during evaluation: PACU Anesthesia Type: General Level of consciousness: awake and alert, oriented and patient cooperative Pain management: pain level controlled Vital Signs Assessment: post-procedure vital signs reviewed and stable Respiratory status: spontaneous breathing, nonlabored ventilation and respiratory function stable Cardiovascular status: blood pressure returned to baseline and stable Postop Assessment: no signs of nausea or vomiting Anesthetic complications: no    Last Vitals:  Filed Vitals:   09/01/15 1415 09/01/15 1421  BP: 133/65   Pulse: 71   Temp:  36.7 C  Resp: 12     Last Pain:  Filed Vitals:   09/01/15 1421  PainSc: 0-No pain                 [name],E. Januel Doolan

## 2015-09-01 NOTE — Transfer of Care (Signed)
Immediate Anesthesia Transfer of Care Note  Patient: [name]  Procedure(s) Performed: Procedure(s) with comments: 25 GAUGE PARS PLANA VITRECTOMY WITH 20 GAUGE MVR PORT (Left) LASER PHOTO ABLATION (Left) - Endo laser MEMBRANE PEEL (Left) AIR/FLUID EXCHANGE (Left)  Patient Location: PACU  Anesthesia Type:General  Level of Consciousness: awake, alert , oriented and patient cooperative  Airway & Oxygen Therapy: Patient Spontanous Breathing and Patient connected to nasal cannula oxygen  Post-op Assessment: Report given to RN, Post -op Vital signs reviewed and stable and Patient moving all extremities  Post vital signs: Reviewed and stable  Last Vitals:  BP 155/71 HR 98 RR 13 SpO2 100 on 2L [place] Resting comfortably, maintains good airway, denies pain.  Complications: No apparent anesthesia complications

## 2015-09-01 NOTE — Progress Notes (Signed)
Patient refuses ordered TED hose.  States she has discussed this with Dr. Zigmund Daniel.  Patient was educated regarding the importance of  VTE prophylaxis during hospital stay, yet still refuses TEDs.

## 2015-09-01 NOTE — Anesthesia Preprocedure Evaluation (Addendum)
Anesthesia Evaluation  Patient identified by MRN, date of birth, ID band Patient awake    Reviewed: Allergy & Precautions, NPO status , Patient's Chart, lab work & pertinent test results  History of Anesthesia Complications (+) PONV and history of anesthetic complications  Airway Mallampati: I  TM Distance: >3 FB Neck ROM: Full    Dental  (+) Edentulous Upper, Edentulous Lower   Pulmonary neg pulmonary ROS,    breath sounds clear to auscultation       Cardiovascular hypertension, Pt. on medications (-) angina Rhythm:Regular Rate:Normal  '08 ECHO: EF 60-65%, mild MR, mild AI   Neuro/Psych TIA   GI/Hepatic negative GI ROS, Neg liver ROS,   Endo/Other  negative endocrine ROS  Renal/GU negative Renal ROS     Musculoskeletal  (+) Arthritis ,   Abdominal   Peds  Hematology negative hematology ROS (+)   Anesthesia Other Findings   Reproductive/Obstetrics                           Anesthesia Physical Anesthesia Plan  ASA: III  Anesthesia Plan: General   Post-op Pain Management:    Induction: Intravenous  Airway Management Planned: Oral ETT  Additional Equipment:   Intra-op Plan:   Post-operative Plan: Extubation in OR  Informed Consent: I have reviewed the patients History and Physical, chart, labs and discussed the procedure including the risks, benefits and alternatives for the proposed anesthesia with the patient or authorized representative who has indicated his/her understanding and acceptance.     Plan Discussed with: CRNA and Surgeon  Anesthesia Plan Comments: (Plan routine monitors, GETA)        Anesthesia Quick Evaluation

## 2015-09-01 NOTE — H&P (Signed)
I examined the patient today and there is no change in the medical status 

## 2015-09-01 NOTE — Anesthesia Procedure Notes (Signed)
Procedure Name: Intubation Date/Time: 09/01/2015 11:46 AM Performed by: Willeen Cass P Pre-anesthesia Checklist: Patient identified, Timeout performed, Emergency Drugs available, Suction available and Patient being monitored Patient Re-evaluated:Patient Re-evaluated prior to inductionOxygen Delivery Method: Circle system utilized Preoxygenation: Pre-oxygenation with 100% oxygen Intubation Type: IV induction Ventilation: Mask ventilation without difficulty Laryngoscope Size: Mac and 3 Grade View: Grade I Tube type: Oral Number of attempts: 1 Airway Equipment and Method: Stylet and LTA kit utilized Placement Confirmation: ETT inserted through vocal cords under direct vision,  breath sounds checked- equal and bilateral and positive ETCO2 Secured at: 22 cm Tube secured with: Tape Dental Injury: Teeth and Oropharynx as per pre-operative assessment  Comments: MV and intubation performed by Jenne Campus, RN (trainee for [employer])

## 2015-09-01 NOTE — Progress Notes (Signed)
Patient states she cannot wear [name].

## 2015-09-01 NOTE — Brief Op Note (Signed)
Brief Operative note   Preoperative diagnosis:  pre retinal fibrosis left eye Postoperative diagnosis  * No Diagnosis Codes entered *  Procedures: Pars plana vitrectomy, membrane peel, laser, gas injection left eye  Surgeon:  Hayden Pedro, MD...  Assistant:  Clancy Gourd RN  Anesthesia: General  Specimen: none  Estimated blood loss:  1cc  Complications: none  Patient sent to PACU in good condition  Composed by Hayden Pedro MD  Dictation number: [id number]

## 2015-09-02 ENCOUNTER — Encounter (HOSPITAL_COMMUNITY): Payer: Self-pay | Admitting: Ophthalmology

## 2015-09-02 DIAGNOSIS — I10 Essential (primary) hypertension: Secondary | ICD-10-CM | POA: Diagnosis not present

## 2015-09-02 DIAGNOSIS — H35372 Puckering of macula, left eye: Secondary | ICD-10-CM | POA: Diagnosis not present

## 2015-09-02 DIAGNOSIS — Z96651 Presence of right artificial knee joint: Secondary | ICD-10-CM | POA: Diagnosis not present

## 2015-09-02 DIAGNOSIS — M1990 Unspecified osteoarthritis, unspecified site: Secondary | ICD-10-CM | POA: Diagnosis not present

## 2015-09-02 DIAGNOSIS — Z885 Allergy status to narcotic agent status: Secondary | ICD-10-CM | POA: Diagnosis not present

## 2015-09-02 MED ORDER — BACITRACIN-POLYMYXIN B 500-10000 UNIT/GM OP OINT: 1.0000 "application " | TOPICAL_OINTMENT | Freq: Three times a day (TID) | OPHTHALMIC | Status: DC

## 2015-09-02 MED ORDER — GATIFLOXACIN 0.5 % OP SOLN: 1.0000 [drp] | Freq: Four times a day (QID) | OPHTHALMIC | Status: DC

## 2015-09-02 MED ORDER — PREDNISOLONE ACETATE 1 % OP SUSP: 1.0000 [drp] | Freq: Four times a day (QID) | OPHTHALMIC | Status: DC

## 2015-09-02 NOTE — Op Note (Signed)
NAMEXI, [name]            ACCOUNT NO.:  [id number]  MEDICAL RECORD NO.:  [id number]  LOCATION:  6N31C                        FACILITY:  [facility]  PHYSICIAN:  Chrystie Nose. Zigmund Daniel, M.D. DATE OF BIRTH:  1931/06/12  DATE OF PROCEDURE: DATE OF DISCHARGE:                              OPERATIVE REPORT   ADMISSION DIAGNOSIS:  Epiretinal membrane in the left eye.  PROCEDURE: 1. Pars plana vitrectomy. 2. Removal of epiretinal membrane with membrane peel. 3. Retinal photocoagulation. 4. Gas fluid exchange in the left eye.  SURGEON:  Chrystie Nose. Zigmund Daniel, M.D.  ASSISTANT:  Clancy Gourd, RN  ANESTHESIA:  General.  DETAILS:  Usual prep and drape, 25-gauge trocars at 10 and 4, three- layered MVR incision at 2 o'clock for 20-gauge instruments.  Pars plana vitrectomy was begun just behind the pseudophakic, a dense white membranes were encountered and removed with the vitreous cutter. Contact lens ring anchored into place at 6 and 12 o'clock.  Provisc and the flat contact lens was placed.  The vitrectomy was carried posteriorly down to the macular surface in a core fashion.  The vitrectomy was carried into the mid periphery, where additional white membranes were encountered and removed.  The magnifying contact lens was exchanged for the flat contact lens on the cornea.  Attention was carried to the posterior membrane.  A 25-gauge pick diamond and diamond- dusted membrane scraper were used to engage the membrane and peel it from its attachment to the macular region.  All membrane was successfully removed.  Once this was accomplished, the contact lens ring was removed and the biome viewing system was moved into place.  The vitrectomy was carried into far periphery down to the vitreous base. The scleral buckle was present.  The endolaser was positioned in the eye 171 burns placed around the retinal periphery.  The power of 300 mW 1000 microns each and 0.1 seconds each.  A 30% gas fluid exchange was  carried out.  The instruments were removed from the eye.  A 9-0 nylon was used to close the sclerotomy site.  The conjunctiva was closed with wet-field cautery.  Polymyxin and gentamicin were injected around the globe for postop pain.  Decadron 10 mg was injected into the lower subconjunctival space.  Marcaine was injected around the globe for postop pain.  Atropine solution was applied.  Closing pressure was 10 with a Baer care tonometer. Polysporin ophthalmic ointment a patch and shield were placed.  The patient was awakened and taken to recovery in satisfactory condition.     Chrystie Nose. Zigmund Daniel, M.D.     JDM/MEDQ  D:  09/01/2015  T:  09/02/2015  Job:  [id number]

## 2015-09-02 NOTE — Progress Notes (Signed)
D/C order in place. Going home instructions given to patient, stated understanding. Discharged in good condition accompanied by daughter.

## 2015-09-02 NOTE — Discharge Summary (Signed)
Discharge summary not needed on [facility] patients per medical records. 

## 2015-09-02 NOTE — Progress Notes (Signed)
09/02/2015, 6:32 AM  Mental Status:  Awake, Alert, Oriented  Anterior segment: Cornea  Clear    Anterior Chamber Clear    Lens:    IOL  Intra Ocular Pressure 11 mmHg with Tonopen  Vitreous: Clear 30%gas bubble   Retina:  Attached Good laser reaction   Impression: Excellent result Retina attached   Final Diagnosis: Principal Problem:   Preretinal fibrosis, left eye Active Problems:   Preretinal fibrosis   Plan: start post operative eye drops.  Discharge to home.  Give post operative instructions  Hayden Pedro 09/02/2015, 6:32 AM

## 2015-09-08 ENCOUNTER — Encounter (INDEPENDENT_AMBULATORY_CARE_PROVIDER_SITE_OTHER): Payer: Medicare Other | Admitting: Ophthalmology

## 2015-09-08 DIAGNOSIS — H35372 Puckering of macula, left eye: Secondary | ICD-10-CM

## 2015-09-30 ENCOUNTER — Encounter (INDEPENDENT_AMBULATORY_CARE_PROVIDER_SITE_OTHER): Payer: Medicare Other | Admitting: Ophthalmology

## 2015-09-30 DIAGNOSIS — H35372 Puckering of macula, left eye: Secondary | ICD-10-CM

## 2015-10-09 DIAGNOSIS — Z96651 Presence of right artificial knee joint: Secondary | ICD-10-CM | POA: Diagnosis not present

## 2015-10-09 DIAGNOSIS — Z96641 Presence of right artificial hip joint: Secondary | ICD-10-CM | POA: Diagnosis not present

## 2015-10-09 DIAGNOSIS — Z471 Aftercare following joint replacement surgery: Secondary | ICD-10-CM | POA: Diagnosis not present

## 2015-10-09 DIAGNOSIS — M7061 Trochanteric bursitis, right hip: Secondary | ICD-10-CM | POA: Diagnosis not present

## 2015-11-11 ENCOUNTER — Ambulatory Visit (INDEPENDENT_AMBULATORY_CARE_PROVIDER_SITE_OTHER): Payer: Medicare Other | Admitting: Ophthalmology

## 2015-11-11 DIAGNOSIS — H35372 Puckering of macula, left eye: Secondary | ICD-10-CM

## 2015-11-18 DIAGNOSIS — E7800 Pure hypercholesterolemia, unspecified: Secondary | ICD-10-CM | POA: Diagnosis not present

## 2015-11-18 DIAGNOSIS — I10 Essential (primary) hypertension: Secondary | ICD-10-CM | POA: Diagnosis not present

## 2015-11-18 DIAGNOSIS — I679 Cerebrovascular disease, unspecified: Secondary | ICD-10-CM | POA: Diagnosis not present

## 2015-11-18 DIAGNOSIS — Z0000 Encounter for general adult medical examination without abnormal findings: Secondary | ICD-10-CM | POA: Diagnosis not present

## 2015-11-27 ENCOUNTER — Ambulatory Visit (INDEPENDENT_AMBULATORY_CARE_PROVIDER_SITE_OTHER): Payer: Medicare Other | Admitting: Ophthalmology

## 2015-11-27 DIAGNOSIS — M7061 Trochanteric bursitis, right hip: Secondary | ICD-10-CM | POA: Diagnosis not present

## 2015-12-23 DIAGNOSIS — H5213 Myopia, bilateral: Secondary | ICD-10-CM | POA: Diagnosis not present

## 2015-12-23 DIAGNOSIS — H35372 Puckering of macula, left eye: Secondary | ICD-10-CM | POA: Diagnosis not present

## 2015-12-23 DIAGNOSIS — Z961 Presence of intraocular lens: Secondary | ICD-10-CM | POA: Diagnosis not present

## 2015-12-23 DIAGNOSIS — H52223 Regular astigmatism, bilateral: Secondary | ICD-10-CM | POA: Diagnosis not present

## 2015-12-28 DIAGNOSIS — Z1231 Encounter for screening mammogram for malignant neoplasm of breast: Secondary | ICD-10-CM | POA: Diagnosis not present

## 2015-12-31 DIAGNOSIS — Z471 Aftercare following joint replacement surgery: Secondary | ICD-10-CM | POA: Diagnosis not present

## 2015-12-31 DIAGNOSIS — M7061 Trochanteric bursitis, right hip: Secondary | ICD-10-CM | POA: Diagnosis not present

## 2015-12-31 DIAGNOSIS — Z96651 Presence of right artificial knee joint: Secondary | ICD-10-CM | POA: Diagnosis not present

## 2016-01-07 DIAGNOSIS — D1801 Hemangioma of skin and subcutaneous tissue: Secondary | ICD-10-CM | POA: Diagnosis not present

## 2016-01-07 DIAGNOSIS — L433 Subacute (active) lichen planus: Secondary | ICD-10-CM | POA: Diagnosis not present

## 2016-01-07 DIAGNOSIS — L570 Actinic keratosis: Secondary | ICD-10-CM | POA: Diagnosis not present

## 2016-01-07 DIAGNOSIS — Z85828 Personal history of other malignant neoplasm of skin: Secondary | ICD-10-CM | POA: Diagnosis not present

## 2016-01-11 ENCOUNTER — Encounter (INDEPENDENT_AMBULATORY_CARE_PROVIDER_SITE_OTHER): Payer: Medicare Other | Admitting: Ophthalmology

## 2016-01-11 DIAGNOSIS — I10 Essential (primary) hypertension: Secondary | ICD-10-CM | POA: Diagnosis not present

## 2016-01-11 DIAGNOSIS — H35372 Puckering of macula, left eye: Secondary | ICD-10-CM

## 2016-01-11 DIAGNOSIS — H338 Other retinal detachments: Secondary | ICD-10-CM

## 2016-01-11 DIAGNOSIS — H35033 Hypertensive retinopathy, bilateral: Secondary | ICD-10-CM | POA: Diagnosis not present

## 2016-01-11 DIAGNOSIS — H43811 Vitreous degeneration, right eye: Secondary | ICD-10-CM | POA: Diagnosis not present

## 2016-01-25 DIAGNOSIS — M4125 Other idiopathic scoliosis, thoracolumbar region: Secondary | ICD-10-CM | POA: Diagnosis not present

## 2016-01-25 DIAGNOSIS — M5136 Other intervertebral disc degeneration, lumbar region: Secondary | ICD-10-CM | POA: Diagnosis not present

## 2016-02-26 ENCOUNTER — Ambulatory Visit (INDEPENDENT_AMBULATORY_CARE_PROVIDER_SITE_OTHER): Payer: Medicare Other | Admitting: Ophthalmology

## 2016-03-15 DIAGNOSIS — R0981 Nasal congestion: Secondary | ICD-10-CM | POA: Diagnosis not present

## 2016-03-15 DIAGNOSIS — R0989 Other specified symptoms and signs involving the circulatory and respiratory systems: Secondary | ICD-10-CM | POA: Diagnosis not present

## 2016-03-15 DIAGNOSIS — J181 Lobar pneumonia, unspecified organism: Secondary | ICD-10-CM | POA: Diagnosis not present

## 2016-03-20 ENCOUNTER — Encounter (HOSPITAL_BASED_OUTPATIENT_CLINIC_OR_DEPARTMENT_OTHER): Payer: Self-pay | Admitting: *Deleted

## 2016-03-20 ENCOUNTER — Emergency Department (HOSPITAL_BASED_OUTPATIENT_CLINIC_OR_DEPARTMENT_OTHER): Payer: Medicare Other

## 2016-03-20 ENCOUNTER — Emergency Department (HOSPITAL_BASED_OUTPATIENT_CLINIC_OR_DEPARTMENT_OTHER)
Admission: EM | Admit: 2016-03-20 | Discharge: 2016-03-20 | Disposition: A | Discharge status: Home / Self Care | Payer: Medicare Other | Attending: Emergency Medicine | Admitting: Emergency Medicine

## 2016-03-20 DIAGNOSIS — Z79899 Other long term (current) drug therapy: Secondary | ICD-10-CM | POA: Diagnosis not present

## 2016-03-20 DIAGNOSIS — M1990 Unspecified osteoarthritis, unspecified site: Secondary | ICD-10-CM | POA: Insufficient documentation

## 2016-03-20 DIAGNOSIS — Z792 Long term (current) use of antibiotics: Secondary | ICD-10-CM | POA: Insufficient documentation

## 2016-03-20 DIAGNOSIS — M549 Dorsalgia, unspecified: Secondary | ICD-10-CM | POA: Diagnosis present

## 2016-03-20 DIAGNOSIS — I10 Essential (primary) hypertension: Secondary | ICD-10-CM | POA: Diagnosis not present

## 2016-03-20 DIAGNOSIS — Z7982 Long term (current) use of aspirin: Secondary | ICD-10-CM | POA: Insufficient documentation

## 2016-03-20 DIAGNOSIS — Z7952 Long term (current) use of systemic steroids: Secondary | ICD-10-CM | POA: Insufficient documentation

## 2016-03-20 DIAGNOSIS — M5441 Lumbago with sciatica, right side: Secondary | ICD-10-CM | POA: Diagnosis not present

## 2016-03-20 DIAGNOSIS — E785 Hyperlipidemia, unspecified: Secondary | ICD-10-CM | POA: Diagnosis not present

## 2016-03-20 DIAGNOSIS — M79671 Pain in right foot: Secondary | ICD-10-CM | POA: Diagnosis not present

## 2016-03-20 DIAGNOSIS — C4402 Squamous cell carcinoma of skin of lip: Secondary | ICD-10-CM | POA: Insufficient documentation

## 2016-03-20 MED ORDER — KETOROLAC TROMETHAMINE 60 MG/2ML IM SOLN
30.0000 mg | Freq: Once | INTRAMUSCULAR | Status: AC
  Administered 2016-03-20: 30 mg via INTRAMUSCULAR
  Filled 2016-03-20: qty 2

## 2016-03-20 NOTE — ED Provider Notes (Signed)
CSN: [id number]     Arrival date & time 03/20/16  0759 History   First MD Initiated Contact with Patient 03/20/16 [phone]     Chief Complaint  Patient presents with  . Back Pain     (Consider location/radiation/quality/duration/timing/severity/associated sxs/prior Treatment) HPI Comments: Patient presents with back pain. She has a history of spinal stenosis and arthritis in her lumbar spine. She has chronic pain in her low back radiating to the right hip area. She states that she normally takes ibuprofen every 6 hours for the pain and that controls it well. She recently was started on a 10 day course of Levaquin for sinusitis/pneumonia. This was started 5 days ago. She states that her PCP advised her not to take ibuprofen while she's taken the Levaquin. She states her pain has escalated since she's not able to take ibuprofen. She denies any numbness or weakness to her extremities. She denies any loss of bowel or bladder control. She does report some pain in her right foot. She stated it felt a little numb 2 days ago but denies any ongoing numbness. Now she states it hurts when she moves it. She denies any injuries to the foot.  Patient is a 82 y.o. female presenting with back pain.  Back Pain Associated symptoms: no abdominal pain, no chest pain, no fever, no headaches, no numbness and no weakness     Past Medical History  Diagnosis Date  . Hypertension   . History of blood transfusion 2006    "w/knee OR  . Pneumonia   . Hyperlipidemia   . PONV (postoperative nausea and vomiting)     "just w/knee OR in 2006"  . Arthritis     "knees, hips, left foot, right hand" (09/01/2015)  . 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 Eye surgery    . Retinal detachment surgery Left 2011  . Mohs surgery Left ~ 2010    lower lip  . Tubal ligation    . Total knee arthroplasty  2006  . Total hip arthroplasty  07/09/2012    Procedure: TOTAL  HIP ARTHROPLASTY;  Surgeon: Gearlean Alf, MD;  Location: [facility];  Service: Orthopedics;  Laterality: Right;  . Cataract extraction w/ intraocular lens  implant, bilateral  2005  . Breast cyst excision Bilateral 1960's  . Joint replacement    . Appendectomy  1970  . Laparoscopic cholecystectomy  2000's  . Vaginal hysterectomy  1984  . 25 gauge pars plana vitrectomy with 20 gauge mvr port Left 09/01/2015  . 25 gauge pars plana vitrectomy with 20 gauge mvr port Left 09/01/2015    Procedure: 25 GAUGE PARS PLANA VITRECTOMY WITH 20 GAUGE MVR PORT;  Surgeon: Hayden Pedro, MD;  Location: [place];  Service: Ophthalmology;  Laterality: Left;  . Laser photo ablation Left 09/01/2015    Procedure: LASER PHOTO ABLATION;  Surgeon: Hayden Pedro, MD;  Location: [place];  Service: Ophthalmology;  Laterality: Left;  Endo laser  . Membrane peel Left 09/01/2015    Procedure: MEMBRANE PEEL;  Surgeon: Hayden Pedro, MD;  Location: [place];  Service: Ophthalmology;  Laterality: Left;  . Air/fluid exchange Left 09/01/2015    Procedure: AIR/FLUID EXCHANGE;  Surgeon: Hayden Pedro, MD;  Location: [place];  Service: Ophthalmology;  Laterality: Left;   Family History  Problem Relation Age of Onset  . Cancer Father   . Heart attack Mother  Social History  Substance Use Topics  . Smoking status: Never Smoker   . Smokeless tobacco: Never Used  . Alcohol Use: No   OB History    Gravida Para Term Preterm AB TAB SAB Ectopic Multiple Living            3     Review of Systems  Constitutional: Negative for fever, chills, diaphoresis and fatigue.  HENT: Negative for congestion, rhinorrhea and sneezing.   Eyes: Negative.   Respiratory: Negative for cough, chest tightness and shortness of breath.   Cardiovascular: Negative for chest pain and leg swelling.  Gastrointestinal: Negative for nausea, vomiting, abdominal pain, diarrhea and blood in stool.  Genitourinary: Negative for frequency, hematuria, flank pain and  difficulty urinating.  Musculoskeletal: Positive for back pain and arthralgias.  Skin: Negative for rash.  Neurological: Negative for dizziness, speech difficulty, weakness, numbness and headaches.      Allergies  Oxycodone  Home Medications   Prior to Admission medications   Medication Sig Start Date End Date Taking? Authorizing Provider  aspirin EC 81 MG tablet Take 81 mg by mouth daily.   Yes Historical Provider, MD  bacitracin-polymyxin b (POLYSPORIN) ophthalmic ointment Place 1 application into the left eye 3 (three) times daily. apply to eye every 12 hours while awake 09/02/15  Yes Hayden Pedro, MD  gatifloxacin (ZYMAXID) 0.5 % SOLN Place 1 drop into the left eye 4 (four) times daily. 09/02/15  Yes Hayden Pedro, MD  levofloxacin (LEVAQUIN) 500 MG tablet Take 500 mg by mouth daily.   Yes Historical Provider, MD  Multiple Vitamin (MULTI-VITAMIN DAILY PO) Take 1 tablet by mouth daily.    Yes Historical Provider, MD  prednisoLONE acetate (PRED FORTE) 1 % ophthalmic suspension Place 1 drop into the left eye 4 (four) times daily. 09/02/15  Yes Hayden Pedro, MD  Propylene Glycol (SYSTANE BALANCE OP) Place 1 drop into both eyes daily as needed. For dry eyes   Yes Historical Provider, MD  simvastatin (ZOCOR) 20 MG tablet Take 20 mg by mouth at bedtime.    Yes Historical Provider, MD  triamterene-hydrochlorothiazide (MAXZIDE-25) 37.5-25 MG tablet Take 1 tablet by mouth daily.   Yes Historical Provider, MD   BP 122/66 mmHg  Pulse 84  Temp(Src) 98.9 F (37.2 C) (Oral)  Resp 16  Ht 5' 2.5" (1.588 m)  Wt 120 lb (54.432 kg)  BMI 21.59 kg/m2  SpO2 100% Physical Exam  Constitutional: She is oriented to person, place, and time. She appears well-developed and well-nourished.  HENT:  Head: Normocephalic and atraumatic.  Eyes: Pupils are equal, round, and reactive to light.  Neck: Normal range of motion. Neck supple.  Cardiovascular: Normal rate, regular rhythm and normal heart sounds.    Pulmonary/Chest: Effort normal and breath sounds normal. No respiratory distress. She has no wheezes. She has no rales. She exhibits no tenderness.  Abdominal: Soft. Bowel sounds are normal. There is no tenderness. There is no rebound and no guarding.  Musculoskeletal: Normal range of motion. She exhibits no edema.  +tenderness to right lumbar paraspinal area.  Neg SLR.  Normal motor function and sensation to lower extremities.  +pain on palpation over right foot, diffusely.  No pain to ankles.  No significant swelling. No color change. No warmth or erythema. She has normal sensation and motor function of foot. Pedal pulses are intact.  Lymphadenopathy:    She has no cervical adenopathy.  Neurological: She is alert and oriented to person, place, and time.  Skin: Skin is warm and dry. No rash noted.  Psychiatric: She has a normal mood and affect.    ED Course  Procedures (including critical care time) Labs Review Labs Reviewed - No data to display  Imaging Review Dg Foot Complete Right  03/20/2016  CLINICAL DATA:  Right foot pain and numbness. EXAM: RIGHT FOOT COMPLETE - 3+ VIEW COMPARISON:  None. FINDINGS: Moderate hallux valgus present with associated osteoarthritis of the first MTP joint. Milder degenerative disease noted of the tarsal bones. No evidence of fracture, dislocation or bony erosion. No bony lesions or soft tissue abnormalities identified. IMPRESSION: Moderate hallux valgus with osteoarthritis of the first MTP joint. Degenerative disease of the tarsal bones. Electronically Signed   By: Aletta Edouard M.D.   On: 03/20/2016 09:44   I have personally reviewed and evaluated these images and lab results as part of my medical decision-making.   EKG Interpretation None      MDM   Final diagnoses:  Midline low back pain with right-sided sciatica    Patient presents with an exacerbation of her chronic low back pain. She was given 1 dose of Toradol in her pain is markedly  improved. She has no neurologic deficits or signs of cauda equina. I did x-ray her right foot which doesn't show any acute fractures. There is no signs of cellulitis. I feel this is arthritic. She was discharged home in good condition. I encouraged her to try Tylenol for symptomatic relief. She doesn't want to try anything stronger like tramadol or Vicodin. I advised her to call her PCP tomorrow if the Tylenol is not effective to get the okay to start back on ibuprofen. Return precautions were given.    Malvin Johns, MD 03/20/16 1018

## 2016-03-20 NOTE — ED Notes (Signed)
Pt reports R lower back/flank pain that radiates down R leg since this past Tuesday. (Reports she was dx with pna and sinusitis Tuesday as well -- currently taking Levaquin.) Denies fever, genitourinary symptoms. Reports vomiting x1 around 0500. States MD instructed her not to take Motrin (which usually relieves pain for her) with the Levaquin.

## 2016-03-24 DIAGNOSIS — R05 Cough: Secondary | ICD-10-CM | POA: Diagnosis not present

## 2016-03-24 DIAGNOSIS — M549 Dorsalgia, unspecified: Secondary | ICD-10-CM | POA: Diagnosis not present

## 2016-03-24 DIAGNOSIS — I872 Venous insufficiency (chronic) (peripheral): Secondary | ICD-10-CM | POA: Diagnosis not present

## 2016-03-24 DIAGNOSIS — R609 Edema, unspecified: Secondary | ICD-10-CM | POA: Diagnosis not present

## 2016-04-04 DIAGNOSIS — H02105 Unspecified ectropion of left lower eyelid: Secondary | ICD-10-CM | POA: Diagnosis not present

## 2016-04-04 DIAGNOSIS — L209 Atopic dermatitis, unspecified: Secondary | ICD-10-CM | POA: Diagnosis not present

## 2016-04-12 DIAGNOSIS — H01004 Unspecified blepharitis left upper eyelid: Secondary | ICD-10-CM | POA: Diagnosis not present

## 2016-04-12 DIAGNOSIS — H01024 Squamous blepharitis left upper eyelid: Secondary | ICD-10-CM | POA: Diagnosis not present

## 2016-04-27 DIAGNOSIS — M5136 Other intervertebral disc degeneration, lumbar region: Secondary | ICD-10-CM | POA: Diagnosis not present

## 2016-04-27 DIAGNOSIS — M4125 Other idiopathic scoliosis, thoracolumbar region: Secondary | ICD-10-CM | POA: Diagnosis not present

## 2016-06-01 DIAGNOSIS — M5136 Other intervertebral disc degeneration, lumbar region: Secondary | ICD-10-CM | POA: Diagnosis not present

## 2016-06-21 DIAGNOSIS — M5136 Other intervertebral disc degeneration, lumbar region: Secondary | ICD-10-CM | POA: Diagnosis not present

## 2016-06-21 DIAGNOSIS — M4125 Other idiopathic scoliosis, thoracolumbar region: Secondary | ICD-10-CM | POA: Diagnosis not present

## 2016-07-18 ENCOUNTER — Ambulatory Visit (INDEPENDENT_AMBULATORY_CARE_PROVIDER_SITE_OTHER): Payer: Medicare Other | Admitting: Ophthalmology

## 2016-07-18 DIAGNOSIS — I10 Essential (primary) hypertension: Secondary | ICD-10-CM

## 2016-07-18 DIAGNOSIS — H43811 Vitreous degeneration, right eye: Secondary | ICD-10-CM

## 2016-07-18 DIAGNOSIS — H35033 Hypertensive retinopathy, bilateral: Secondary | ICD-10-CM | POA: Diagnosis not present

## 2016-07-18 DIAGNOSIS — H35372 Puckering of macula, left eye: Secondary | ICD-10-CM

## 2016-07-19 DIAGNOSIS — H10412 Chronic giant papillary conjunctivitis, left eye: Secondary | ICD-10-CM | POA: Diagnosis not present

## 2016-07-19 DIAGNOSIS — Z961 Presence of intraocular lens: Secondary | ICD-10-CM | POA: Diagnosis not present

## 2016-07-19 DIAGNOSIS — H04522 Eversion of left lacrimal punctum: Secondary | ICD-10-CM | POA: Diagnosis not present

## 2016-08-10 DIAGNOSIS — H04522 Eversion of left lacrimal punctum: Secondary | ICD-10-CM | POA: Diagnosis not present

## 2016-08-10 DIAGNOSIS — H10412 Chronic giant papillary conjunctivitis, left eye: Secondary | ICD-10-CM | POA: Diagnosis not present

## 2016-08-10 DIAGNOSIS — Z961 Presence of intraocular lens: Secondary | ICD-10-CM | POA: Diagnosis not present

## 2016-10-31 DIAGNOSIS — H401131 Primary open-angle glaucoma, bilateral, mild stage: Secondary | ICD-10-CM | POA: Diagnosis not present

## 2016-10-31 DIAGNOSIS — H04123 Dry eye syndrome of bilateral lacrimal glands: Secondary | ICD-10-CM | POA: Diagnosis not present

## 2016-10-31 DIAGNOSIS — Z961 Presence of intraocular lens: Secondary | ICD-10-CM | POA: Diagnosis not present

## 2016-10-31 DIAGNOSIS — H04522 Eversion of left lacrimal punctum: Secondary | ICD-10-CM | POA: Diagnosis not present

## 2016-10-31 DIAGNOSIS — H10412 Chronic giant papillary conjunctivitis, left eye: Secondary | ICD-10-CM | POA: Diagnosis not present

## 2016-12-02 DIAGNOSIS — I10 Essential (primary) hypertension: Secondary | ICD-10-CM | POA: Diagnosis not present

## 2016-12-02 DIAGNOSIS — E7800 Pure hypercholesterolemia, unspecified: Secondary | ICD-10-CM | POA: Diagnosis not present

## 2016-12-02 DIAGNOSIS — Z0000 Encounter for general adult medical examination without abnormal findings: Secondary | ICD-10-CM | POA: Diagnosis not present

## 2017-01-04 DIAGNOSIS — H02004 Unspecified entropion of left upper eyelid: Secondary | ICD-10-CM | POA: Diagnosis not present

## 2017-01-04 DIAGNOSIS — H40019 Open angle with borderline findings, low risk, unspecified eye: Secondary | ICD-10-CM | POA: Diagnosis not present

## 2017-01-04 DIAGNOSIS — H52223 Regular astigmatism, bilateral: Secondary | ICD-10-CM | POA: Diagnosis not present

## 2017-01-04 DIAGNOSIS — H5213 Myopia, bilateral: Secondary | ICD-10-CM | POA: Diagnosis not present

## 2017-01-04 DIAGNOSIS — H40053 Ocular hypertension, bilateral: Secondary | ICD-10-CM | POA: Diagnosis not present

## 2017-01-04 DIAGNOSIS — H35373 Puckering of macula, bilateral: Secondary | ICD-10-CM | POA: Diagnosis not present

## 2017-01-09 DIAGNOSIS — Z85828 Personal history of other malignant neoplasm of skin: Secondary | ICD-10-CM | POA: Diagnosis not present

## 2017-01-09 DIAGNOSIS — L821 Other seborrheic keratosis: Secondary | ICD-10-CM | POA: Diagnosis not present

## 2017-01-09 DIAGNOSIS — D692 Other nonthrombocytopenic purpura: Secondary | ICD-10-CM | POA: Diagnosis not present

## 2017-01-09 DIAGNOSIS — D1801 Hemangioma of skin and subcutaneous tissue: Secondary | ICD-10-CM | POA: Diagnosis not present

## 2017-01-16 ENCOUNTER — Ambulatory Visit (INDEPENDENT_AMBULATORY_CARE_PROVIDER_SITE_OTHER): Payer: Medicare Other | Admitting: Ophthalmology

## 2017-01-16 DIAGNOSIS — H43811 Vitreous degeneration, right eye: Secondary | ICD-10-CM

## 2017-01-16 DIAGNOSIS — H338 Other retinal detachments: Secondary | ICD-10-CM

## 2017-01-16 DIAGNOSIS — H35372 Puckering of macula, left eye: Secondary | ICD-10-CM | POA: Diagnosis not present

## 2017-01-16 DIAGNOSIS — H35033 Hypertensive retinopathy, bilateral: Secondary | ICD-10-CM

## 2017-01-16 DIAGNOSIS — I10 Essential (primary) hypertension: Secondary | ICD-10-CM | POA: Diagnosis not present

## 2017-03-06 DIAGNOSIS — Z961 Presence of intraocular lens: Secondary | ICD-10-CM | POA: Diagnosis not present

## 2017-03-06 DIAGNOSIS — H10412 Chronic giant papillary conjunctivitis, left eye: Secondary | ICD-10-CM | POA: Diagnosis not present

## 2017-03-06 DIAGNOSIS — H401131 Primary open-angle glaucoma, bilateral, mild stage: Secondary | ICD-10-CM | POA: Diagnosis not present

## 2017-03-06 DIAGNOSIS — H04522 Eversion of left lacrimal punctum: Secondary | ICD-10-CM | POA: Diagnosis not present

## 2017-03-06 DIAGNOSIS — H04123 Dry eye syndrome of bilateral lacrimal glands: Secondary | ICD-10-CM | POA: Diagnosis not present

## 2017-03-23 DIAGNOSIS — M25661 Stiffness of right knee, not elsewhere classified: Secondary | ICD-10-CM | POA: Diagnosis not present

## 2017-03-23 DIAGNOSIS — Z96651 Presence of right artificial knee joint: Secondary | ICD-10-CM | POA: Diagnosis not present

## 2017-03-23 DIAGNOSIS — M1712 Unilateral primary osteoarthritis, left knee: Secondary | ICD-10-CM | POA: Diagnosis not present

## 2017-03-23 DIAGNOSIS — Z471 Aftercare following joint replacement surgery: Secondary | ICD-10-CM | POA: Diagnosis not present

## 2017-03-23 DIAGNOSIS — M1711 Unilateral primary osteoarthritis, right knee: Secondary | ICD-10-CM | POA: Diagnosis not present

## 2017-04-26 ENCOUNTER — Encounter: Payer: Self-pay | Admitting: Podiatry

## 2017-04-26 ENCOUNTER — Ambulatory Visit (INDEPENDENT_AMBULATORY_CARE_PROVIDER_SITE_OTHER): Payer: Medicare Other | Admitting: Podiatry

## 2017-04-26 ENCOUNTER — Ambulatory Visit (INDEPENDENT_AMBULATORY_CARE_PROVIDER_SITE_OTHER): Payer: Medicare Other

## 2017-04-26 DIAGNOSIS — M8430XA Stress fracture, unspecified site, initial encounter for fracture: Secondary | ICD-10-CM

## 2017-04-26 DIAGNOSIS — B351 Tinea unguium: Secondary | ICD-10-CM

## 2017-04-26 DIAGNOSIS — M898X9 Other specified disorders of bone, unspecified site: Secondary | ICD-10-CM

## 2017-04-26 DIAGNOSIS — M79674 Pain in right toe(s): Secondary | ICD-10-CM

## 2017-04-26 NOTE — Patient Instructions (Signed)
Please wear the metatarsal pad which attach to the insole on your right shoe and ongoing daily basis For exercise please do water aerobics

## 2017-04-26 NOTE — Progress Notes (Signed)
   Subjective:    Patient ID: [name], female    DOB: 09/07/1931, 83 y.o.   MRN: [id number]  HPI This patient presents today complaining of approximately one year history of increasing pain in the right foot. The pain increases with standing walking and is relieved with rest and elevation. Patient has tried a variety shoes and is found 1 shoe to reduce some of the pain upon weight-bearing. Patient has stopped physical activity of walking for exercise and does water aerobics. Foot is pain free during water aerobics. Patient describes prior evaluation of the right foot with x-ray examination and diagnosis of arthritis  Patient secondarily complaining of a thickened and uncomfortable right hallux toenail developing over the last 6-12 months. Patient unable to trim the nail itself   Review of Systems  Musculoskeletal:       Joint pain  Skin:       Thick toenail on my right big toe   All other systems reviewed and are negative.      Objective:   Physical Exam  Orientated 3  Vascular: DP and PT pulses 2/4 bilaterally Capillary reflex media bilaterally  Neurological: Sensation to 10 g monofilament wire intact 4/5 bilaterally Vibratory sensation reactive bilaterally Ankle reflexes reactive bilaterally  Dermatological: Pretibial areas demonstrate hyperpigmentation with purpura bilaterally Atrophic skin with absent hair growth bilaterally Hypertrophic, deformed elongated right hallux toenail Absent left hallux toenail  Musculoskeletal: Mild HAV right Exquisite palpable tenderness dorsal distal one third of fifth metatarsal, fourth metatarsal, third metatarsal right foot without any palpable lesions. These areas duplicate areas of patient discomfort Atrophic fad pad MPJ bilaterally  X-ray examination weightbearing right foot dated 04/26/2017  Intact bony structure without fracture and/or dislocation Decreased joint space midfoot HAV right with decreased joint  space Hammertoe second  Radiographic impression: No acute bony abnormality noted in the weightbearing x-ray of the right foot dated 04/26/2017 Osteoarthritis first MPJ right and midfoot        Assessment & Plan:   Assessment: Bone stress reaction third-fifth metatarsal right foot Osteoarthritis right first MPJ and midfoot right Symptomatic mycotic right hallux toenail  Plan: I reviewed the results with patient today. As a trial and placing a metatarsal pad a patient's insole on the right shoe to see if this will reduce the symptoms in the free-fifth metatarsal on the right foot Instructed patient to water aerobics for exercise Reevaluate 4 weeks and if no improvement consider immobilization Debridement right hallux nail without any bleeding  Reappoint 4 weeks

## 2017-06-05 ENCOUNTER — Encounter: Payer: Self-pay | Admitting: Podiatry

## 2017-06-05 ENCOUNTER — Ambulatory Visit (INDEPENDENT_AMBULATORY_CARE_PROVIDER_SITE_OTHER): Payer: Medicare Other | Admitting: Podiatry

## 2017-06-05 DIAGNOSIS — M8430XA Stress fracture, unspecified site, initial encounter for fracture: Secondary | ICD-10-CM | POA: Diagnosis not present

## 2017-06-05 NOTE — Patient Instructions (Signed)
Place wear the surgical shoe on the right foot when standing and walking on an ongoing continuous basis

## 2017-06-05 NOTE — Progress Notes (Signed)
   Subjective:    Patient ID: [name], female    DOB: November 05, 1930, 83 y.o.   MRN: [id number]  HPI    Review of Systems  All other systems reviewed and are negative.      Objective:   Physical Exam        Assessment & Plan:

## 2017-06-05 NOTE — Progress Notes (Signed)
Patient ID: [name], female   DOB: [birth year], 83 y.o.   MRN: [id number]   Subjective: Patient presents for follow-up visit of 04/26/2017. Patient states her right foot still is quite a cough walking wearing shoes and did not notice much improvement with additional metatarsal padding. Patient missed that she is having left knee pain with pending the replacement causing her to place more weight on the right foot  This patient presents today complaining of approximately one year history of increasing pain in the right foot. The pain increases with standing walking and is relieved with rest and elevation. Patient has tried a variety shoes and is found 1 shoe to reduce some of the pain upon weight-bearing.   Orientated 3  Vascular: DP and PT pulses 2/4 bilaterally Capillary reflex media bilaterally  Neurological: Sensation to 10 g monofilament wire intact 4/5 bilaterally Vibratory sensation reactive bilaterally Ankle reflexes reactive bilaterally  Dermatological: Pretibial areas demonstrate hyperpigmentation with purpura bilaterally Atrophic skin with absent hair growth bilaterally Hypertrophic, deformed elongated right hallux toenail Absent left hallux toenail  Musculoskeletal: Mild HAV right Exquisite palpable tenderness dorsal distal one third of fifth metatarsal, fourth metatarsal, third metatarsal right foot without any palpable lesions. These areas duplicate areas of patient discomfort Atrophic fad pad MPJ bilaterally  X-ray examination weightbearing right foot dated 04/26/2017  Intact bony structure without fracture and/or dislocation Decreased joint space midfoot HAV right with decreased joint space Hammertoe second  Radiographic impression: No acute bony abnormality noted in the weightbearing x-ray of the right foot dated 04/26/2017 Osteoarthritis first MPJ right and midfoot  Assessment: Bone stress reaction third-fifth metatarsal right  foot Osteoarthritis right first MPJ and midfoot right Right foot and probably associated with left knee pain and weight transfer  Plan: Dispensed surgical shoe for patient to wear and right foot  Reappoint 6 weeks

## 2017-06-23 DIAGNOSIS — M1712 Unilateral primary osteoarthritis, left knee: Secondary | ICD-10-CM | POA: Diagnosis not present

## 2017-07-24 ENCOUNTER — Ambulatory Visit: Payer: Medicare Other | Admitting: Podiatry

## 2017-08-02 DIAGNOSIS — M19079 Primary osteoarthritis, unspecified ankle and foot: Secondary | ICD-10-CM | POA: Diagnosis not present

## 2017-08-02 DIAGNOSIS — M79671 Pain in right foot: Secondary | ICD-10-CM | POA: Diagnosis not present

## 2017-08-02 DIAGNOSIS — M659 Synovitis and tenosynovitis, unspecified: Secondary | ICD-10-CM | POA: Diagnosis not present

## 2017-08-02 DIAGNOSIS — G8929 Other chronic pain: Secondary | ICD-10-CM | POA: Diagnosis not present

## 2017-09-03 ENCOUNTER — Ambulatory Visit: Payer: Self-pay | Admitting: Orthopedic Surgery

## 2017-09-05 ENCOUNTER — Ambulatory Visit: Payer: Self-pay | Admitting: Orthopedic Surgery

## 2017-09-05 NOTE — H&P (Signed)
[name] DOB: [birth year] Widowed / Language: [name] / Race: White Female Date of Admission:  10/02/2017 CC:  Left Knee Pain History of Present Illness The patient is a 83 year old female who comes in for a preoperative History and Physical. The patient is scheduled for a left total knee arthroplasty to be performed by Dr. Dione Plover. Aluisio, MD at [facility] on 10-02-2017. The patient is a 83 year old female who presents today for follow up of their knee. The patient is being followed for their left knee pain and osteoarthritis. They are now 3 months out from a cortisone injection. Symptoms reported today include: pain, pain after sitting, pain with weightbearing and difficulty ambulating. The patient feels that they are doing poorly and report their pain level to be mild to moderate. Current treatment includes: NSAIDs (Motrin). The patient has not gotten any relief of their symptoms with Cortisone injections. The patient indicates that they have questions or concerns today regarding pain and their progress at this point. She is at a point where she is having enough pain that she wants to have the knee replaced. She has advanced end-stage arthritis LEFT knee with valgus deformity. She has had cortisone and Visco supplement injections without benefit. Patient has significant pain and dysfunction in their knee. They have had injections and failed nonoperative management. At this point the pain and dysfunction have essentially taken over their life. The most predictable means of improving pain and function is total knee arthroplasty. We discussed the differences in total knee arthroplasty now compared to when she had the other side done. They have been treated conservatively in the past for the above stated problem and despite conservative measures, they continue to have progressive pain and severe functional limitations and dysfunction. They have failed non-operative management including  home exercise, medications, and injections. It is felt that they would benefit from undergoing total joint replacement. Risks and benefits of the procedure have been discussed with the patient and they elect to proceed with surgery. There are no active contraindications to surgery such as ongoing infection or rapidly progressive neurological disease.  Problem List/Past Medical  Trochanteric bursitis of right hip (M70.61)  Primary osteoarthritis of right knee (M17.11)  Degenerative lumbar disc (M51.36)  TIA (transient ischemic attack) (G45.9)  Date: 04/2007. Skin Cancer  Impaired Vision  Dentures  High blood pressure  Hypercholesterolemia   Allergies Oxycodone-Acetaminophen *ANALGESICS - OPIOID*   Family History Osteoarthritis  First Degree Relatives. sister Cerebrovascular Accident  brother Diabetes Mellitus  brother  Social History Tobacco use  Former smoker. former smoker; smoke(d) less than 1/2 pack(s) per day Exercise  Exercises daily; does other Pain Contract  no Illicit drug use  no Living situation  live alone Current work status  retired Engineer, agricultural (Previously)  no Children  3 Drug/Alcohol Rehab (Currently)  no Marital status  widowed Tobacco / smoke exposure  no Alcohol use  never consumed alcohol Post-Surgical Plans  Home With Family, Home, Straight to Outpatient Therapy. Plan is to go home with daughter.  Advance Directives  Living Will and Healthcare POA  Medication History  Triamterene (Oral) Specific strength unknown - Active. Zocor (Oral) Specific strength unknown - Active. Aspirin EC (81MG  Tablet DR, Oral) Active. Centrum Silver (Oral) Active. Motrin (1 (one) Oral) Specific strength unknown - Active. Fish Oil Active.  Past Surgical History Tubal Ligation  Appendectomy  Cataract Surgery  bilateral Hysterectomy  Date: 5. complete (non-cancerous) Gallbladder Surgery  Date: 1996. laporoscopic Total Knee  Replacement  Date: 2006. right Lower Lip Surgery  Date: 2010. Retinal Surgery  2011, 2016 Right Total Hip Replacement  Date: 2013.  Review of Systems  General Not Present- Chills, Fatigue, Fever, Memory Loss, Night Sweats, Weight Gain and Weight Loss. Skin Not Present- Eczema, Hives, Itching, Lesions and Rash. HEENT Present- Dentures. Not Present- Double Vision, Headache, Hearing Loss, Tinnitus and Visual Loss. Respiratory Not Present- Allergies, Chronic Cough, Coughing up blood, Shortness of breath at rest and Shortness of breath with exertion. Cardiovascular Not Present- Chest Pain, Difficulty Breathing Lying Down, Murmur, Palpitations, Racing/skipping heartbeats and Swelling. Gastrointestinal Not Present- Abdominal Pain, Bloody Stool, Constipation, Diarrhea, Difficulty Swallowing, Heartburn, Jaundice, Loss of appetitie, Nausea and Vomiting. Female Genitourinary Not Present- Blood in Urine, Discharge, Flank Pain, Incontinence, Painful Urination, Urgency, Urinary frequency, Urinary Retention, Urinating at Night and Weak urinary stream. Musculoskeletal Present- Joint Pain. Not Present- Back Pain, Joint Swelling, Morning Stiffness, Muscle Pain, Muscle Weakness and Spasms. Neurological Not Present- Blackout spells, Difficulty with balance, Dizziness, Paralysis, Tremor and Weakness. Psychiatric Not Present- Insomnia.  Vitals  Weight: 62 lb Height: 130in Body Surface Area: 1.99 m Body Mass Index: 2.58 kg/m  Pulse: 92 (Regular)  BP: 134/84 (Sitting, Right Arm, Standard)  Physical Exam General Mental Status -Alert, cooperative and good historian. General Appearance-pleasant, Not in acute distress. Orientation-Oriented X3. Build & Nutrition-Well nourished and Well developed.  Head and Neck Head-normocephalic, atraumatic . Neck Global Assessment - supple, no bruit auscultated on the right, no bruit auscultated on the left.  Eye Vision-Decreased(left eye) and  Wears corrective lenses. Pupil - Bilateral-Regular and Round. Motion - Bilateral-EOMI.  Chest and Lung Exam Auscultation Breath sounds - clear at anterior chest wall and clear at posterior chest wall. Adventitious sounds - No Adventitious sounds.  Cardiovascular Auscultation Rhythm - Regular rate and rhythm. Heart Sounds - S1 WNL and S2 WNL. Murmurs & Other Heart Sounds - Auscultation of the heart reveals - No Murmurs.  Abdomen Palpation/Percussion Tenderness - Abdomen is non-tender to palpation. Rigidity (guarding) - Abdomen is soft. Auscultation Auscultation of the abdomen reveals - Bowel sounds normal.  Female Genitourinary Note: Not done, not pertinent to present illness  Musculoskeletal Note: She is tender laterally greater than medially about the left knee. No major effusion. Mild instability about the knee secondary to the significant valgus deformity she has. ROM 5-125. Sensation and motor function intact in LE. Distal pulses 2+.  Assessment & Plan Primary osteoarthritis of left knee (M17.12) Aftercare following right knee joint replacement surgery (Z47.1, Z96.651)  Note:Surgical Plans: Left Total Knee Replacement  Disposition: Home with family, Straight to outpatient therapy  PCP: Dr. Harrington Challenger - Patient has been seen preoperatively and felt to be stable for surgery.  Topical TXA - History of TIA  Anesthesia Issues: History of nausea in the past  Patient was instructed on what medications to stop prior to surgery.  Signed electronically by Joelene Millin, III PA-C

## 2017-09-22 ENCOUNTER — Encounter (HOSPITAL_COMMUNITY): Payer: Self-pay

## 2017-09-22 NOTE — Patient Instructions (Signed)
Your procedure is scheduled on: Monday, Jan. 7, 2019   Surgery Time:  11:35AM-12:25PM   Report to [place]  Entrance    Report to admitting at 9:00 AM    Call this number if you have problems the morning of surgery  [phone]   Do not eat food or drink liquids :After Midnight.   Do NOT smoke after Midnight   Take these medicines the morning of surgery with A SIP OF WATER:  None   Use eye drops per normal routine                               You may not have any metal on your body including hair pins, jewelry, and body piercings             Do not wear make-up, lotions, powders, perfumes,or deodorant             Do not wear nail polish.  Do not shave  48 hours prior to surgery.                Do not bring valuables to the hospital. [place].   Contacts, dentures or bridgework may not be worn into surgery.   Leave suitcase in the car. After surgery it may be brought to your room.   Special Instructions: Bring a copy of your healthcare power of attorney and living will documents         the day of surgery if you haven't scanned them in before.               Please read over the following fact sheets you were given:  [facility] - Preparing for Surgery Before surgery, you can play an important role.  Because skin is not sterile, your skin needs to be as free of germs as possible.  You can reduce the number of germs on your skin by washing with CHG (chlorahexidine gluconate) soap before surgery.  CHG is an antiseptic cleaner which kills germs and bonds with the skin to continue killing germs even after washing. Please DO NOT use if you have an allergy to CHG or antibacterial soaps.  If your skin becomes reddened/irritated stop using the CHG and inform your nurse when you arrive at Short Stay. Do not shave (including legs and underarms) for at least 48 hours prior to the first CHG shower.  You may shave your  face/neck.  Please follow these instructions carefully:  1.  Shower with CHG Soap the night before surgery and the  morning of surgery.  2.  If you choose to wash your hair, wash your hair first as usual with your normal  shampoo.  3.  After you shampoo, rinse your hair and body thoroughly to remove the shampoo.                             4.  Use CHG as you would any other liquid soap.  You can apply chg directly to the skin and wash.  Gently with a scrungie or clean washcloth.  5.  Apply the CHG Soap to your body ONLY FROM THE NECK DOWN.   Do   not use on face/ open  Wound or open sores. Avoid contact with eyes, ears mouth and   genitals (private parts).                       Wash face,  Genitals (private parts) with your normal soap.             6.  Wash thoroughly, paying special attention to the area where your    surgery  will be performed.  7.  Thoroughly rinse your body with warm water from the neck down.  8.  DO NOT shower/wash with your normal soap after using and rinsing off the CHG Soap.                9.  Pat yourself dry with a clean towel.            10.  Wear clean pajamas.            11.  Place clean sheets on your bed the night of your first shower and do not  sleep with pets. Day of Surgery : Do not apply any lotions/deodorants the morning of surgery.  Please wear clean clothes to the hospital/surgery center.  FAILURE TO FOLLOW THESE INSTRUCTIONS MAY RESULT IN THE CANCELLATION OF YOUR SURGERY  PATIENT SIGNATURE_________________________________  NURSE SIGNATURE__________________________________  ________________________________________________________________________   Adam Phenix  An incentive spirometer is a tool that can help keep your lungs clear and active. This tool measures how well you are filling your lungs with each breath. Taking long deep breaths may help reverse or decrease the chance of developing breathing (pulmonary)  problems (especially infection) following:  A long period of time when you are unable to move or be active. BEFORE THE PROCEDURE   If the spirometer includes an indicator to show your best effort, your nurse or respiratory therapist will set it to a desired goal.  If possible, sit up straight or lean slightly forward. Try not to slouch.  Hold the incentive spirometer in an upright position. INSTRUCTIONS FOR USE  1. Sit on the edge of your bed if possible, or sit up as far as you can in bed or on a chair. 2. Hold the incentive spirometer in an upright position. 3. Breathe out normally. 4. Place the mouthpiece in your mouth and seal your lips tightly around it. 5. Breathe in slowly and as deeply as possible, raising the piston or the ball toward the top of the column. 6. Hold your breath for 3-5 seconds or for as long as possible. Allow the piston or ball to fall to the bottom of the column. 7. Remove the mouthpiece from your mouth and breathe out normally. 8. Rest for a few seconds and repeat Steps 1 through 7 at least 10 times every 1-2 hours when you are awake. Take your time and take a few normal breaths between deep breaths. 9. The spirometer may include an indicator to show your best effort. Use the indicator as a goal to work toward during each repetition. 10. After each set of 10 deep breaths, practice coughing to be sure your lungs are clear. If you have an incision (the cut made at the time of surgery), support your incision when coughing by placing a pillow or rolled up towels firmly against it. Once you are able to get out of bed, walk around indoors and cough well. You may stop using the incentive spirometer when instructed by your caregiver.  RISKS AND COMPLICATIONS  Take your time  so you do not get dizzy or light-headed.  If you are in pain, you may need to take or ask for pain medication before doing incentive spirometry. It is harder to take a deep breath if you are having  pain. AFTER USE  Rest and breathe slowly and easily.  It can be helpful to keep track of a log of your progress. Your caregiver can provide you with a simple table to help with this. If you are using the spirometer at home, follow these instructions: [place] IF:   You are having difficultly using the spirometer.  You have trouble using the spirometer as often as instructed.  Your pain medication is not giving enough relief while using the spirometer.  You develop fever of 100.5 F (38.1 C) or higher. SEEK IMMEDIATE MEDICAL CARE IF:   You cough up bloody sputum that had not been present before.  You develop fever of 102 F (38.9 C) or greater.  You develop worsening pain at or near the incision site. MAKE SURE YOU:   Understand these instructions.  Will watch your condition.  Will get help right away if you are not doing well or get worse. Document Released: 01/23/2007 Document Revised: 12/05/2011 Document Reviewed: 03/26/2007 ExitCare Patient Information 2014 Exit[employer], [place].   ________________________________________________________________________  WHAT IS A BLOOD TRANSFUSION? Blood Transfusion Information  A transfusion is the replacement of blood or some of its parts. Blood is made up of multiple cells which provide different functions.  Red blood cells carry oxygen and are used for blood loss replacement.  White blood cells fight against infection.  Platelets control bleeding.  Plasma helps clot blood.  Other blood products are available for specialized needs, such as hemophilia or other clotting disorders. BEFORE THE TRANSFUSION  Who gives blood for transfusions?   Healthy volunteers who are fully evaluated to make sure their blood is safe. This is blood bank blood. Transfusion therapy is the safest it has ever been in the practice of medicine. Before blood is taken from a donor, a complete history is taken to make sure that person has no history  of diseases nor engages in risky social behavior (examples are intravenous drug use or sexual activity with multiple partners). The donor's travel history is screened to minimize risk of transmitting infections, such as malaria. The donated blood is tested for signs of infectious diseases, such as HIV and hepatitis. The blood is then tested to be sure it is compatible with you in order to minimize the chance of a transfusion reaction. If you or a relative donates blood, this is often done in anticipation of surgery and is not appropriate for emergency situations. It takes many days to process the donated blood. RISKS AND COMPLICATIONS Although transfusion therapy is very safe and saves many lives, the main dangers of transfusion include:   Getting an infectious disease.  Developing a transfusion reaction. This is an allergic reaction to something in the blood you were given. Every precaution is taken to prevent this. The decision to have a blood transfusion has been considered carefully by your caregiver before blood is given. Blood is not given unless the benefits outweigh the risks. AFTER THE TRANSFUSION  Right after receiving a blood transfusion, you will usually feel much better and more energetic. This is especially true if your red blood cells have gotten low (anemic). The transfusion raises the level of the red blood cells which carry oxygen, and this usually causes an energy increase.  The  nurse administering the transfusion will monitor you carefully for complications. HOME CARE INSTRUCTIONS  No special instructions are needed after a transfusion. You may find your energy is better. Speak with your caregiver about any limitations on activity for underlying diseases you may have. SEEK MEDICAL CARE IF:   Your condition is not improving after your transfusion.  You develop redness or irritation at the intravenous (IV) site. SEEK IMMEDIATE MEDICAL CARE IF:  Any of the following symptoms  occur over the next 12 hours:  Shaking chills.  You have a temperature by mouth above 102 F (38.9 C), not controlled by medicine.  Chest, back, or muscle pain.  People around you feel you are not acting correctly or are confused.  Shortness of breath or difficulty breathing.  Dizziness and fainting.  You get a rash or develop hives.  You have a decrease in urine output.  Your urine turns a dark color or changes to pink, red, or brown. Any of the following symptoms occur over the next 10 days:  You have a temperature by mouth above 102 F (38.9 C), not controlled by medicine.  Shortness of breath.  Weakness after normal activity.  The white part of the eye turns yellow (jaundice).  You have a decrease in the amount of urine or are urinating less often.  Your urine turns a dark color or changes to pink, red, or brown. Document Released: 09/09/2000 Document Revised: 12/05/2011 Document Reviewed: 04/28/2008 [facility] Patient Information 2014 [place], [place].  _______________________________________________________________________

## 2017-09-22 NOTE — Pre-Procedure Instructions (Signed)
Dr. Harrington Challenger surgical clearance on chart. ECHO 05/04/07 in epic

## 2017-09-27 ENCOUNTER — Other Ambulatory Visit: Payer: Self-pay

## 2017-09-27 ENCOUNTER — Encounter (HOSPITAL_COMMUNITY): Payer: Self-pay

## 2017-09-27 ENCOUNTER — Encounter (HOSPITAL_COMMUNITY)
Admission: RE | Admit: 2017-09-27 | Discharge: 2017-09-27 | Disposition: A | Discharge status: Home / Self Care | Payer: Medicare Other | Source: Ambulatory Visit | Attending: Orthopedic Surgery | Admitting: Orthopedic Surgery

## 2017-09-27 DIAGNOSIS — Z01810 Encounter for preprocedural cardiovascular examination: Secondary | ICD-10-CM | POA: Diagnosis not present

## 2017-09-27 DIAGNOSIS — Z01812 Encounter for preprocedural laboratory examination: Secondary | ICD-10-CM | POA: Insufficient documentation

## 2017-09-27 DIAGNOSIS — I10 Essential (primary) hypertension: Secondary | ICD-10-CM | POA: Insufficient documentation

## 2017-09-27 DIAGNOSIS — Z8673 Personal history of transient ischemic attack (TIA), and cerebral infarction without residual deficits: Secondary | ICD-10-CM | POA: Insufficient documentation

## 2017-09-27 HISTORY — DX: Candidal stomatitis: B37.0

## 2017-09-27 HISTORY — DX: Transient cerebral ischemic attack, unspecified: G45.9

## 2017-09-27 LAB — CBC
HEMATOCRIT: 35.1 % — AB (ref 36.0–46.0)
Hemoglobin: 11.9 g/dL — ABNORMAL LOW (ref 12.0–15.0)
MCH: 30.9 pg (ref 26.0–34.0)
MCHC: 33.9 g/dL (ref 30.0–36.0)
MCV: 91.2 fL (ref 78.0–100.0)
Platelets: 383 10*3/uL (ref 150–400)
RBC: 3.85 MIL/uL — ABNORMAL LOW (ref 3.87–5.11)
RDW: 13.2 % (ref 11.5–15.5)
WBC: 8 10*3/uL (ref 4.0–10.5)

## 2017-09-27 LAB — COMPREHENSIVE METABOLIC PANEL
ALK PHOS: 60 U/L (ref 38–126)
ALT: 20 U/L (ref 14–54)
ANION GAP: 9 (ref 5–15)
AST: 25 U/L (ref 15–41)
Albumin: 4.2 g/dL (ref 3.5–5.0)
BUN: 20 mg/dL (ref 6–20)
CALCIUM: 9.8 mg/dL (ref 8.9–10.3)
CO2: 29 mmol/L (ref 22–32)
Chloride: 95 mmol/L — ABNORMAL LOW (ref 101–111)
Creatinine, Ser: 0.86 mg/dL (ref 0.44–1.00)
GFR calc Af Amer: >60 mL/min (ref >60)
Glucose, Bld: 109 mg/dL — ABNORMAL HIGH (ref 65–99)
POTASSIUM: 4.2 mmol/L (ref 3.5–5.1)
Sodium: 133 mmol/L — ABNORMAL LOW (ref 135–145)
TOTAL PROTEIN: 7.8 g/dL (ref 6.5–8.1)
Total Bilirubin: 0.8 mg/dL (ref 0.3–1.2)

## 2017-09-27 LAB — SURGICAL PCR SCREEN
MRSA, PCR: NEGATIVE
STAPHYLOCOCCUS AUREUS: NEGATIVE

## 2017-09-27 LAB — PROTIME-INR
INR: 0.95
PROTHROMBIN TIME: 12.6 s (ref 11.4–15.2)

## 2017-09-27 LAB — APTT: aPTT: 34 seconds (ref 24–36)

## 2017-09-27 NOTE — Pre-Procedure Instructions (Signed)
CBC and CMP results 09/27/17 faxed to Dr. Wynelle Link via epic.

## 2017-10-01 ENCOUNTER — Ambulatory Visit: Payer: Self-pay | Admitting: Orthopedic Surgery

## 2017-10-01 NOTE — H&P (Signed)
[name] DOB: [birth year] Widowed / Language: [name] / Race: White Female Date of admission: October 02, 2017  Chief complaint: left knee pain  History of Present Illness  The patient is a 84 year old female who comes in for a preoperative History and Physical. The patient is scheduled for a left total knee arthroplasty to be performed by Dr. Dione Plover. Aluisio, MD at [facility] on 10-02-2017. The patient is a 84 year old female who presents today for follow up of their knee. The patient is being followed for their left knee pain and osteoarthritis. They are now 3 months out from a cortisone injection. Symptoms reported today include: pain, pain after sitting, pain with weightbearing and difficulty ambulating. The patient feels that they are doing poorly and report their pain level to be mild to moderate. Current treatment includes: NSAIDs (Motrin). The patient has not gotten any relief of their symptoms with Cortisone injections. The patient indicates that they have questions or concerns today regarding pain and their progress at this point. She is at a point where she is having enough pain that she wants to have the knee replaced. She has advanced end-stage arthritis LEFT knee with valgus deformity. She has had cortisone and Visco supplement injections without benefit. Patient has significant pain and dysfunction in their knee. They have had injections and failed nonoperative management. At this point the pain and dysfunction have essentially taken over their life. The most predictable means of improving pain and function is total knee arthroplasty. We discussed the differences in total knee arthroplasty now compared to when she had the other side done. They have been treated conservatively in the past for the above stated problem and despite conservative measures, they continue to have progressive pain and severe functional limitations and dysfunction. They have failed  non-operative management including home exercise, medications, and injections. It is felt that they would benefit from undergoing total joint replacement. Risks and benefits of the procedure have been discussed with the patient and they elect to proceed with surgery. There are no active contraindications to surgery such as ongoing infection or rapidly progressive neurological disease.   Problem List/Past Medical Other idiopathic scoliosis, thoracolumbar region (M41.25)  Trochanteric bursitis of right hip (M70.61)  Primary osteoarthritis of right knee (M17.11)  Degenerative lumbar disc (M51.36)  Arthritis, midfoot (M19.079)  TIA (transient ischemic attack) (G45.9)  Date: 04/2007. Skin Cancer  Impaired Vision  Dentures  High blood pressure  Hypercholesterolemia    Allergies Oxycodone-Acetaminophen *ANALGESICS - OPIOID*   Family History  Osteoarthritis  First Degree Relatives. sister Cerebrovascular Accident  brother Diabetes Mellitus  brother  Social History  Tobacco use  Former smoker. former smoker; smoke(d) less than 1/2 pack(s) per day Exercise  Exercises daily; does other Pain Contract  no Illicit drug use  no Living situation  live alone Current work status  retired Engineer, agricultural (Previously)  no Children  3 Drug/Alcohol Rehab (Currently)  no Marital status  widowed Tobacco / smoke exposure  no Alcohol use  never consumed alcohol Post-Surgical Plans  Home With Family, Home, Straight to Outpatient Therapy. Plan is to go home with daughter. Advance Directives  Living Will and Healthcare POA  Medication History  Triamterene (Oral) Specific strength unknown - Active. Zocor (Oral) Specific strength unknown - Active. Aspirin EC (81MG  Tablet DR, Oral) Active. Centrum Silver (Oral) Active. Motrin (1 (one) Oral) Specific strength unknown - Active. Fish Oil Active.  Past Surgical History  Tubal Ligation  Appendectomy  Cataract  Surgery  bilateral Hysterectomy  Date: 62. complete (non-cancerous) Gallbladder Surgery  Date: 1996. laporoscopic Total Knee Replacement  Date: 2006. right Lower Lip Surgery  Date: 2010. Retinal Surgery  2011, 2016 Right Total Hip Replacement  Date: 2013.    Review of Systems General Not Present- Chills, Fatigue, Fever, Memory Loss, Night Sweats, Weight Gain and Weight Loss. Skin Not Present- Eczema, Hives, Itching, Lesions and Rash. HEENT Present- Dentures. Not Present- Double Vision, Headache, Hearing Loss, Tinnitus and Visual Loss. Respiratory Not Present- Allergies, Chronic Cough, Coughing up blood, Shortness of breath at rest and Shortness of breath with exertion. Cardiovascular Not Present- Chest Pain, Difficulty Breathing Lying Down, Murmur, Palpitations, Racing/skipping heartbeats and Swelling. Gastrointestinal Not Present- Abdominal Pain, Bloody Stool, Constipation, Diarrhea, Difficulty Swallowing, Heartburn, Jaundice, Loss of appetitie, Nausea and Vomiting. Female Genitourinary Not Present- Blood in Urine, Discharge, Flank Pain, Incontinence, Painful Urination, Urgency, Urinary frequency, Urinary Retention, Urinating at Night and Weak urinary stream. Musculoskeletal Present- Joint Pain. Not Present- Back Pain, Joint Swelling, Morning Stiffness, Muscle Pain, Muscle Weakness and Spasms. Neurological Not Present- Blackout spells, Difficulty with balance, Dizziness, Paralysis, Tremor and Weakness. Psychiatric Not Present- Insomnia.  Vitals  Weight: 62 lb Height: 130in Body Surface Area: 1.99 m Body Mass Index: 2.58 kg/m  Pulse: 92 (Regular)  BP: 134/84 (Sitting, Right Arm, Standard)      Physical Exam General Mental Status -Alert, cooperative and good historian. General Appearance-pleasant, Not in acute distress. Orientation-Oriented X3. Build & Nutrition-Well nourished and Well developed.  Head and Neck Head-normocephalic, atraumatic  . Neck Global Assessment - supple, no bruit auscultated on the right, no bruit auscultated on the left.  Eye Vision-Decreased(left eye) and Wears corrective lenses. Pupil - Bilateral-Regular and Round. Motion - Bilateral-EOMI.  Chest and Lung Exam Auscultation Breath sounds - clear at anterior chest wall and clear at posterior chest wall. Adventitious sounds - No Adventitious sounds.  Cardiovascular Auscultation Rhythm - Regular rate and rhythm. Heart Sounds - S1 WNL and S2 WNL. Murmurs & Other Heart Sounds - Auscultation of the heart reveals - No Murmurs.  Abdomen Palpation/Percussion Tenderness - Abdomen is non-tender to palpation. Rigidity (guarding) - Abdomen is soft. Auscultation Auscultation of the abdomen reveals - Bowel sounds normal.  Female Genitourinary Note: Not done, not pertinent to present illness   Musculoskeletal Note: She is tender laterally greater than medially about the left knee. No major effusion. Mild instability about the knee secondary to the significant valgus deformity she has. ROM 5-125. Sensation and motor function intact in LE. Distal pulses 2+.     Assessment & Plan  Primary osteoarthritis of left knee (M17.12) Aftercare following right knee joint replacement surgery (Z47.1, Z96.651)  Note:Surgical Plans: Left Total Knee Replacement  Disposition: Home with family, Straight to outpatient therapy  PCP: Dr. Harrington Challenger - Patient has been seen preoperatively and felt to be stable for surgery.  Topical TXA - History of TIA  Anesthesia Issues: History of nausea in the past  Patient was instructed on what medications to stop prior to surgery.  Signed electronically by Joelene Millin, III PA-C

## 2017-10-01 NOTE — H&P (View-Only) (Signed)
[name] DOB: 1931/06/03 Widowed / Language: [name] / Race: White Female Date of admission: October 02, 2017  Chief complaint: left knee pain  History of Present Illness  The patient is a 84 year old female who comes in for a preoperative History and Physical. The patient is scheduled for a left total knee arthroplasty to be performed by Dr. Dione Plover. Aluisio, MD at [facility] on 10-02-2017. The patient is a 84 year old female who presents today for follow up of their knee. The patient is being followed for their left knee pain and osteoarthritis. They are now 3 months out from a cortisone injection. Symptoms reported today include: pain, pain after sitting, pain with weightbearing and difficulty ambulating. The patient feels that they are doing poorly and report their pain level to be mild to moderate. Current treatment includes: NSAIDs (Motrin). The patient has not gotten any relief of their symptoms with Cortisone injections. The patient indicates that they have questions or concerns today regarding pain and their progress at this point. She is at a point where she is having enough pain that she wants to have the knee replaced. She has advanced end-stage arthritis LEFT knee with valgus deformity. She has had cortisone and Visco supplement injections without benefit. Patient has significant pain and dysfunction in their knee. They have had injections and failed nonoperative management. At this point the pain and dysfunction have essentially taken over their life. The most predictable means of improving pain and function is total knee arthroplasty. We discussed the differences in total knee arthroplasty now compared to when she had the other side done. They have been treated conservatively in the past for the above stated problem and despite conservative measures, they continue to have progressive pain and severe functional limitations and dysfunction. They have failed  non-operative management including home exercise, medications, and injections. It is felt that they would benefit from undergoing total joint replacement. Risks and benefits of the procedure have been discussed with the patient and they elect to proceed with surgery. There are no active contraindications to surgery such as ongoing infection or rapidly progressive neurological disease.   Problem List/Past Medical Other idiopathic scoliosis, thoracolumbar region (M41.25)  Trochanteric bursitis of right hip (M70.61)  Primary osteoarthritis of right knee (M17.11)  Degenerative lumbar disc (M51.36)  Arthritis, midfoot (M19.079)  TIA (transient ischemic attack) (G45.9)  Date: 04/2007. Skin Cancer  Impaired Vision  Dentures  High blood pressure  Hypercholesterolemia    Allergies Oxycodone-Acetaminophen *ANALGESICS - OPIOID*   Family History  Osteoarthritis  First Degree Relatives. sister Cerebrovascular Accident  brother Diabetes Mellitus  brother  Social History  Tobacco use  Former smoker. former smoker; smoke(d) less than 1/2 pack(s) per day Exercise  Exercises daily; does other Pain Contract  no Illicit drug use  no Living situation  live alone Current work status  retired Engineer, agricultural (Previously)  no Children  3 Drug/Alcohol Rehab (Currently)  no Marital status  widowed Tobacco / smoke exposure  no Alcohol use  never consumed alcohol Post-Surgical Plans  Home With Family, Home, Straight to Outpatient Therapy. Plan is to go home with daughter. Advance Directives  Living Will and Healthcare POA  Medication History  Triamterene (Oral) Specific strength unknown - Active. Zocor (Oral) Specific strength unknown - Active. Aspirin EC (81MG  Tablet DR, Oral) Active. Centrum Silver (Oral) Active. Motrin (1 (one) Oral) Specific strength unknown - Active. Fish Oil Active.  Past Surgical History  Tubal Ligation  Appendectomy  Cataract  Surgery  bilateral Hysterectomy  Date: 25. complete (non-cancerous) Gallbladder Surgery  Date: 1996. laporoscopic Total Knee Replacement  Date: 2006. right Lower Lip Surgery  Date: 2010. Retinal Surgery  2011, 2016 Right Total Hip Replacement  Date: 2013.    Review of Systems General Not Present- Chills, Fatigue, Fever, Memory Loss, Night Sweats, Weight Gain and Weight Loss. Skin Not Present- Eczema, Hives, Itching, Lesions and Rash. HEENT Present- Dentures. Not Present- Double Vision, Headache, Hearing Loss, Tinnitus and Visual Loss. Respiratory Not Present- Allergies, Chronic Cough, Coughing up blood, Shortness of breath at rest and Shortness of breath with exertion. Cardiovascular Not Present- Chest Pain, Difficulty Breathing Lying Down, Murmur, Palpitations, Racing/skipping heartbeats and Swelling. Gastrointestinal Not Present- Abdominal Pain, Bloody Stool, Constipation, Diarrhea, Difficulty Swallowing, Heartburn, Jaundice, Loss of appetitie, Nausea and Vomiting. Female Genitourinary Not Present- Blood in Urine, Discharge, Flank Pain, Incontinence, Painful Urination, Urgency, Urinary frequency, Urinary Retention, Urinating at Night and Weak urinary stream. Musculoskeletal Present- Joint Pain. Not Present- Back Pain, Joint Swelling, Morning Stiffness, Muscle Pain, Muscle Weakness and Spasms. Neurological Not Present- Blackout spells, Difficulty with balance, Dizziness, Paralysis, Tremor and Weakness. Psychiatric Not Present- Insomnia.  Vitals  Weight: 62 lb Height: 130in Body Surface Area: 1.99 m Body Mass Index: 2.58 kg/m  Pulse: 92 (Regular)  BP: 134/84 (Sitting, Right Arm, Standard)      Physical Exam General Mental Status -Alert, cooperative and good historian. General Appearance-pleasant, Not in acute distress. Orientation-Oriented X3. Build & Nutrition-Well nourished and Well developed.  Head and Neck Head-normocephalic, atraumatic  . Neck Global Assessment - supple, no bruit auscultated on the right, no bruit auscultated on the left.  Eye Vision-Decreased(left eye) and Wears corrective lenses. Pupil - Bilateral-Regular and Round. Motion - Bilateral-EOMI.  Chest and Lung Exam Auscultation Breath sounds - clear at anterior chest wall and clear at posterior chest wall. Adventitious sounds - No Adventitious sounds.  Cardiovascular Auscultation Rhythm - Regular rate and rhythm. Heart Sounds - S1 WNL and S2 WNL. Murmurs & Other Heart Sounds - Auscultation of the heart reveals - No Murmurs.  Abdomen Palpation/Percussion Tenderness - Abdomen is non-tender to palpation. Rigidity (guarding) - Abdomen is soft. Auscultation Auscultation of the abdomen reveals - Bowel sounds normal.  Female Genitourinary Note: Not done, not pertinent to present illness   Musculoskeletal Note: She is tender laterally greater than medially about the left knee. No major effusion. Mild instability about the knee secondary to the significant valgus deformity she has. ROM 5-125. Sensation and motor function intact in LE. Distal pulses 2+.     Assessment & Plan  Primary osteoarthritis of left knee (M17.12) Aftercare following right knee joint replacement surgery (Z47.1, Z96.651)  Note:Surgical Plans: Left Total Knee Replacement  Disposition: Home with family, Straight to outpatient therapy  PCP: Dr. Harrington Challenger - Patient has been seen preoperatively and felt to be stable for surgery.  Topical TXA - History of TIA  Anesthesia Issues: History of nausea in the past  Patient was instructed on what medications to stop prior to surgery.  Signed electronically by Joelene Millin, III PA-C

## 2017-10-02 ENCOUNTER — Other Ambulatory Visit: Payer: Self-pay

## 2017-10-02 ENCOUNTER — Inpatient Hospital Stay (HOSPITAL_COMMUNITY)
Admission: RE | Admit: 2017-10-02 | Discharge: 2017-10-06 | DRG: 470 | Disposition: A | Discharge status: Home / Self Care | Payer: Medicare Other | Source: Ambulatory Visit | Attending: Orthopedic Surgery | Admitting: Orthopedic Surgery

## 2017-10-02 ENCOUNTER — Encounter (HOSPITAL_COMMUNITY)
Admission: RE | Disposition: A | Discharge status: Home / Self Care | Payer: Self-pay | Source: Ambulatory Visit | Attending: Orthopedic Surgery

## 2017-10-02 ENCOUNTER — Encounter (HOSPITAL_COMMUNITY): Payer: Self-pay | Admitting: *Deleted

## 2017-10-02 ENCOUNTER — Inpatient Hospital Stay (HOSPITAL_COMMUNITY): Payer: Medicare Other | Admitting: Anesthesiology

## 2017-10-02 DIAGNOSIS — M1712 Unilateral primary osteoarthritis, left knee: Secondary | ICD-10-CM | POA: Diagnosis not present

## 2017-10-02 DIAGNOSIS — M1710 Unilateral primary osteoarthritis, unspecified knee: Secondary | ICD-10-CM

## 2017-10-02 DIAGNOSIS — Z823 Family history of stroke: Secondary | ICD-10-CM

## 2017-10-02 DIAGNOSIS — I10 Essential (primary) hypertension: Secondary | ICD-10-CM | POA: Diagnosis not present

## 2017-10-02 DIAGNOSIS — Z87891 Personal history of nicotine dependence: Secondary | ICD-10-CM | POA: Diagnosis not present

## 2017-10-02 DIAGNOSIS — M21062 Valgus deformity, not elsewhere classified, left knee: Secondary | ICD-10-CM | POA: Diagnosis not present

## 2017-10-02 DIAGNOSIS — Z8673 Personal history of transient ischemic attack (TIA), and cerebral infarction without residual deficits: Secondary | ICD-10-CM | POA: Diagnosis not present

## 2017-10-02 DIAGNOSIS — Z833 Family history of diabetes mellitus: Secondary | ICD-10-CM

## 2017-10-02 DIAGNOSIS — Z96651 Presence of right artificial knee joint: Secondary | ICD-10-CM | POA: Diagnosis present

## 2017-10-02 DIAGNOSIS — Z885 Allergy status to narcotic agent status: Secondary | ICD-10-CM

## 2017-10-02 DIAGNOSIS — Z7982 Long term (current) use of aspirin: Secondary | ICD-10-CM | POA: Diagnosis not present

## 2017-10-02 DIAGNOSIS — G8918 Other acute postprocedural pain: Secondary | ICD-10-CM | POA: Diagnosis not present

## 2017-10-02 DIAGNOSIS — E785 Hyperlipidemia, unspecified: Secondary | ICD-10-CM | POA: Diagnosis present

## 2017-10-02 DIAGNOSIS — Z96641 Presence of right artificial hip joint: Secondary | ICD-10-CM | POA: Diagnosis not present

## 2017-10-02 DIAGNOSIS — Z85819 Personal history of malignant neoplasm of unspecified site of lip, oral cavity, and pharynx: Secondary | ICD-10-CM | POA: Diagnosis not present

## 2017-10-02 DIAGNOSIS — M179 Osteoarthritis of knee, unspecified: Secondary | ICD-10-CM | POA: Diagnosis present

## 2017-10-02 DIAGNOSIS — J690 Pneumonitis due to inhalation of food and vomit: Secondary | ICD-10-CM | POA: Diagnosis not present

## 2017-10-02 HISTORY — PX: TOTAL KNEE ARTHROPLASTY: SHX125

## 2017-10-02 LAB — TYPE AND SCREEN
ABO/RH(D): A POS
ANTIBODY SCREEN: NEGATIVE

## 2017-10-02 SURGERY — ARTHROPLASTY, KNEE, TOTAL
Anesthesia: Regional | Site: Knee | Laterality: Left

## 2017-10-02 MED ORDER — SODIUM CHLORIDE 0.9 % IR SOLN
Status: DC | PRN
  Administered 2017-10-02: 1000 mL

## 2017-10-02 MED ORDER — ROPIVACAINE HCL 7.5 MG/ML IJ SOLN
INTRAMUSCULAR | Status: DC | PRN
  Administered 2017-10-02: 20 mL via PERINEURAL

## 2017-10-02 MED ORDER — FLUTICASONE PROPIONATE 50 MCG/ACT NA SUSP: 1.0000 | Freq: Every evening | NASAL | Status: DC | PRN

## 2017-10-02 MED ORDER — ONDANSETRON HCL 4 MG/2ML IJ SOLN
INTRAMUSCULAR | Status: AC
  Filled 2017-10-02: qty 2

## 2017-10-02 MED ORDER — DEXAMETHASONE SODIUM PHOSPHATE 10 MG/ML IJ SOLN
INTRAMUSCULAR | Status: AC
  Filled 2017-10-02: qty 1

## 2017-10-02 MED ORDER — ONDANSETRON HCL 4 MG/2ML IJ SOLN
INTRAMUSCULAR | Status: DC | PRN
  Administered 2017-10-02: 4 mg via INTRAVENOUS

## 2017-10-02 MED ORDER — PHENOL 1.4 % MT LIQD: 1.0000 | OROMUCOSAL | Status: DC | PRN

## 2017-10-02 MED ORDER — SODIUM CHLORIDE 0.9 % IV SOLN
2000.0000 mg | Freq: Once | INTRAVENOUS | Status: DC
  Filled 2017-10-02: qty 20

## 2017-10-02 MED ORDER — ACETAMINOPHEN 325 MG PO TABS
650.0000 mg | ORAL_TABLET | ORAL | Status: DC | PRN
  Administered 2017-10-05 – 2017-10-06 (×4): 650 mg via ORAL
  Filled 2017-10-02 (×4): qty 2

## 2017-10-02 MED ORDER — TRAMADOL HCL 50 MG PO TABS: 50.0000 mg | ORAL_TABLET | Freq: Four times a day (QID) | ORAL | Status: DC | PRN

## 2017-10-02 MED ORDER — PHENYLEPHRINE HCL 10 MG/ML IJ SOLN: 30.0000 ug/min | INTRAVENOUS | Status: DC

## 2017-10-02 MED ORDER — DEXAMETHASONE SODIUM PHOSPHATE 10 MG/ML IJ SOLN
10.0000 mg | Freq: Once | INTRAMUSCULAR | Status: AC
  Administered 2017-10-02: 10 mg via INTRAVENOUS

## 2017-10-02 MED ORDER — STERILE WATER FOR IRRIGATION IR SOLN
Status: DC | PRN
  Administered 2017-10-02: 2000 mL

## 2017-10-02 MED ORDER — CEFAZOLIN SODIUM-DEXTROSE 2-4 GM/100ML-% IV SOLN
INTRAVENOUS | Status: AC
  Filled 2017-10-02: qty 100

## 2017-10-02 MED ORDER — BUPIVACAINE LIPOSOME 1.3 % IJ SUSP
INTRAMUSCULAR | Status: DC | PRN
  Administered 2017-10-02 (×2): 10 mL

## 2017-10-02 MED ORDER — ACETAMINOPHEN 10 MG/ML IV SOLN
1000.0000 mg | Freq: Once | INTRAVENOUS | Status: AC
  Administered 2017-10-02: 1000 mg via INTRAVENOUS

## 2017-10-02 MED ORDER — SIMVASTATIN 20 MG PO TABS
20.0000 mg | ORAL_TABLET | Freq: Every day | ORAL | Status: DC
  Administered 2017-10-02 – 2017-10-05 (×4): 20 mg via ORAL
  Filled 2017-10-02 (×4): qty 1

## 2017-10-02 MED ORDER — RIVAROXABAN 10 MG PO TABS
10.0000 mg | ORAL_TABLET | Freq: Every day | ORAL | Status: DC
  Administered 2017-10-03 – 2017-10-06 (×4): 10 mg via ORAL
  Filled 2017-10-02 (×4): qty 1

## 2017-10-02 MED ORDER — MIDAZOLAM HCL 2 MG/2ML IJ SOLN: 1.0000 mg | INTRAMUSCULAR | Status: DC

## 2017-10-02 MED ORDER — FENTANYL CITRATE (PF) 100 MCG/2ML IJ SOLN
INTRAMUSCULAR | Status: DC | PRN
  Administered 2017-10-02: 25 ug via INTRAVENOUS
  Administered 2017-10-02: 50 ug via INTRAVENOUS

## 2017-10-02 MED ORDER — ACETAMINOPHEN 500 MG PO TABS
1000.0000 mg | ORAL_TABLET | Freq: Four times a day (QID) | ORAL | Status: AC
  Administered 2017-10-02 – 2017-10-03 (×3): 1000 mg via ORAL
  Filled 2017-10-02 (×3): qty 2

## 2017-10-02 MED ORDER — FENTANYL CITRATE (PF) 100 MCG/2ML IJ SOLN
INTRAMUSCULAR | Status: AC
  Administered 2017-10-02: 50 ug via INTRAVENOUS
  Filled 2017-10-02: qty 2

## 2017-10-02 MED ORDER — SODIUM CHLORIDE 0.9 % IJ SOLN
INTRAMUSCULAR | Status: AC
  Filled 2017-10-02: qty 50

## 2017-10-02 MED ORDER — ONDANSETRON HCL 4 MG/2ML IJ SOLN: 4.0000 mg | Freq: Once | INTRAMUSCULAR | Status: DC | PRN

## 2017-10-02 MED ORDER — PROPOFOL 500 MG/50ML IV EMUL
INTRAVENOUS | Status: DC | PRN
  Administered 2017-10-02: 75 ug/kg/min via INTRAVENOUS

## 2017-10-02 MED ORDER — MIDAZOLAM HCL 2 MG/2ML IJ SOLN
INTRAMUSCULAR | Status: AC
  Filled 2017-10-02: qty 2

## 2017-10-02 MED ORDER — PROPOFOL 10 MG/ML IV BOLUS
INTRAVENOUS | Status: AC
  Filled 2017-10-02: qty 60

## 2017-10-02 MED ORDER — PHENYLEPHRINE HCL 10 MG/ML IJ SOLN
INTRAMUSCULAR | Status: AC
  Filled 2017-10-02: qty 1

## 2017-10-02 MED ORDER — BUPIVACAINE LIPOSOME 1.3 % IJ SUSP
20.0000 mL | Freq: Once | INTRAMUSCULAR | Status: DC
  Filled 2017-10-02: qty 20

## 2017-10-02 MED ORDER — ONDANSETRON HCL 4 MG PO TABS: 4.0000 mg | ORAL_TABLET | Freq: Four times a day (QID) | ORAL | Status: DC | PRN

## 2017-10-02 MED ORDER — LACTATED RINGERS IV SOLN
INTRAVENOUS | Status: DC
  Administered 2017-10-02 (×3): via INTRAVENOUS

## 2017-10-02 MED ORDER — 0.9 % SODIUM CHLORIDE (POUR BTL) OPTIME
TOPICAL | Status: DC | PRN
  Administered 2017-10-02: 1000 mL

## 2017-10-02 MED ORDER — DIPHENHYDRAMINE HCL 12.5 MG/5ML PO ELIX: 12.5000 mg | ORAL_SOLUTION | ORAL | Status: DC | PRN

## 2017-10-02 MED ORDER — METHOCARBAMOL 1000 MG/10ML IJ SOLN
500.0000 mg | Freq: Four times a day (QID) | INTRAVENOUS | Status: DC | PRN
  Filled 2017-10-02: qty 5

## 2017-10-02 MED ORDER — MENTHOL 3 MG MT LOZG
1.0000 | LOZENGE | OROMUCOSAL | Status: DC | PRN
  Filled 2017-10-02: qty 9

## 2017-10-02 MED ORDER — DEXAMETHASONE SODIUM PHOSPHATE 10 MG/ML IJ SOLN
10.0000 mg | Freq: Once | INTRAMUSCULAR | Status: AC
  Administered 2017-10-03: 09:00:00 10 mg via INTRAVENOUS
  Filled 2017-10-02: qty 1

## 2017-10-02 MED ORDER — FENTANYL CITRATE (PF) 100 MCG/2ML IJ SOLN
50.0000 ug | INTRAMUSCULAR | Status: AC
  Administered 2017-10-02 (×2): 50 ug via INTRAVENOUS

## 2017-10-02 MED ORDER — FENTANYL CITRATE (PF) 100 MCG/2ML IJ SOLN
INTRAMUSCULAR | Status: AC
  Filled 2017-10-02: qty 2

## 2017-10-02 MED ORDER — ACETAMINOPHEN 650 MG RE SUPP: 650.0000 mg | RECTAL | Status: DC | PRN

## 2017-10-02 MED ORDER — TRANEXAMIC ACID 1000 MG/10ML IV SOLN: 2000.0000 mg | Freq: Once | INTRAVENOUS | Status: DC

## 2017-10-02 MED ORDER — TRAMADOL HCL 50 MG PO TABS
50.0000 mg | ORAL_TABLET | Freq: Four times a day (QID) | ORAL | Status: DC | PRN
  Administered 2017-10-03: 50 mg via ORAL
  Filled 2017-10-02: qty 1

## 2017-10-02 MED ORDER — BUPIVACAINE IN DEXTROSE 0.75-8.25 % IT SOLN
INTRATHECAL | Status: DC | PRN
  Administered 2017-10-02: 1.8 mL via INTRATHECAL

## 2017-10-02 MED ORDER — METHOCARBAMOL 500 MG PO TABS
500.0000 mg | ORAL_TABLET | Freq: Four times a day (QID) | ORAL | Status: DC | PRN
  Administered 2017-10-03 – 2017-10-06 (×6): 500 mg via ORAL
  Filled 2017-10-02 (×6): qty 1

## 2017-10-02 MED ORDER — CEFAZOLIN SODIUM-DEXTROSE 2-4 GM/100ML-% IV SOLN
2.0000 g | Freq: Four times a day (QID) | INTRAVENOUS | Status: AC
  Administered 2017-10-02 (×2): 2 g via INTRAVENOUS
  Filled 2017-10-02 (×2): qty 100

## 2017-10-02 MED ORDER — POLYETHYLENE GLYCOL 3350 17 G PO PACK: 17.0000 g | PACK | Freq: Every day | ORAL | Status: DC | PRN

## 2017-10-02 MED ORDER — MIDAZOLAM HCL 5 MG/5ML IJ SOLN
INTRAMUSCULAR | Status: DC | PRN
  Administered 2017-10-02: 0.5 mg via INTRAVENOUS

## 2017-10-02 MED ORDER — BISACODYL 10 MG RE SUPP: 10.0000 mg | Freq: Every day | RECTAL | Status: DC | PRN

## 2017-10-02 MED ORDER — ONDANSETRON HCL 4 MG/2ML IJ SOLN: 4.0000 mg | Freq: Four times a day (QID) | INTRAMUSCULAR | Status: DC | PRN

## 2017-10-02 MED ORDER — TRIAMTERENE-HCTZ 37.5-25 MG PO TABS
1.0000 | ORAL_TABLET | Freq: Every day | ORAL | Status: DC
  Administered 2017-10-03 – 2017-10-06 (×3): 1 via ORAL
  Filled 2017-10-02 (×3): qty 1

## 2017-10-02 MED ORDER — METOCLOPRAMIDE HCL 5 MG/ML IJ SOLN: 5.0000 mg | Freq: Three times a day (TID) | INTRAMUSCULAR | Status: DC | PRN

## 2017-10-02 MED ORDER — PHENYLEPHRINE 40 MCG/ML (10ML) SYRINGE FOR IV PUSH (FOR BLOOD PRESSURE SUPPORT)
PREFILLED_SYRINGE | INTRAVENOUS | Status: AC
  Filled 2017-10-02: qty 10

## 2017-10-02 MED ORDER — ACETAMINOPHEN 10 MG/ML IV SOLN
INTRAVENOUS | Status: AC
  Filled 2017-10-02: qty 100

## 2017-10-02 MED ORDER — SODIUM CHLORIDE 0.9 % IJ SOLN
INTRAMUSCULAR | Status: DC | PRN
  Administered 2017-10-02 (×2): 30 mL

## 2017-10-02 MED ORDER — DOCUSATE SODIUM 100 MG PO CAPS
100.0000 mg | ORAL_CAPSULE | Freq: Two times a day (BID) | ORAL | Status: DC
  Administered 2017-10-02 – 2017-10-06 (×8): 100 mg via ORAL
  Filled 2017-10-02 (×8): qty 1

## 2017-10-02 MED ORDER — HYDROMORPHONE HCL 2 MG PO TABS
2.0000 mg | ORAL_TABLET | ORAL | Status: DC | PRN
  Administered 2017-10-02 – 2017-10-04 (×6): 2 mg via ORAL
  Filled 2017-10-02 (×7): qty 1

## 2017-10-02 MED ORDER — SODIUM CHLORIDE 0.9 % IJ SOLN
INTRAMUSCULAR | Status: AC
  Filled 2017-10-02: qty 10

## 2017-10-02 MED ORDER — PHENYLEPHRINE HCL 10 MG/ML IJ SOLN
INTRAVENOUS | Status: DC | PRN
  Administered 2017-10-02: 20 ug/min via INTRAVENOUS

## 2017-10-02 MED ORDER — CEFAZOLIN SODIUM-DEXTROSE 2-4 GM/100ML-% IV SOLN
2.0000 g | INTRAVENOUS | Status: AC
  Administered 2017-10-02: 2 g via INTRAVENOUS

## 2017-10-02 MED ORDER — METOCLOPRAMIDE HCL 5 MG PO TABS: 5.0000 mg | ORAL_TABLET | Freq: Three times a day (TID) | ORAL | Status: DC | PRN

## 2017-10-02 MED ORDER — SODIUM CHLORIDE 0.9 % IV SOLN
INTRAVENOUS | Status: DC
  Administered 2017-10-02: 17:00:00 via INTRAVENOUS

## 2017-10-02 MED ORDER — FLEET ENEMA 7-19 GM/118ML RE ENEM: 1.0000 | ENEMA | Freq: Once | RECTAL | Status: DC | PRN

## 2017-10-02 MED ORDER — TRANEXAMIC ACID 1000 MG/10ML IV SOLN
INTRAVENOUS | Status: AC | PRN
  Administered 2017-10-02: 2000 mg via TOPICAL

## 2017-10-02 MED ORDER — FENTANYL CITRATE (PF) 100 MCG/2ML IJ SOLN: 25.0000 ug | INTRAMUSCULAR | Status: DC | PRN

## 2017-10-02 MED ORDER — HYDROMORPHONE HCL 2 MG PO TABS
4.0000 mg | ORAL_TABLET | ORAL | Status: DC | PRN
  Administered 2017-10-02 – 2017-10-04 (×6): 4 mg via ORAL
  Filled 2017-10-02 (×6): qty 2

## 2017-10-02 MED ORDER — CHLORHEXIDINE GLUCONATE 4 % EX LIQD: 60.0000 mL | Freq: Once | CUTANEOUS | Status: DC

## 2017-10-02 MED ORDER — HYDROMORPHONE HCL 1 MG/ML IJ SOLN: 0.5000 mg | INTRAMUSCULAR | Status: DC | PRN

## 2017-10-02 SURGICAL SUPPLY — 53 items
BAG DECANTER FOR FLEXI CONT (MISCELLANEOUS) ×3 IMPLANT
BAG ZIPLOCK 12X15 (MISCELLANEOUS) ×3 IMPLANT
BANDAGE ACE 6X5 VEL STRL LF (GAUZE/BANDAGES/DRESSINGS) ×3 IMPLANT
BLADE SAG 18X100X1.27 (BLADE) ×3 IMPLANT
BLADE SAW SGTL 11.0X1.19X90.0M (BLADE) ×3 IMPLANT
BOWL SMART MIX CTS (DISPOSABLE) ×3 IMPLANT
CAP KNEE TOTAL 3 SIGMA ×3 IMPLANT
CEMENT HV SMART SET (Cement) ×6 IMPLANT
CLOSURE WOUND 1/2 X4 (GAUZE/BANDAGES/DRESSINGS) ×1
COVER SURGICAL LIGHT HANDLE (MISCELLANEOUS) ×3 IMPLANT
CUFF TOURN SGL QUICK 34 (TOURNIQUET CUFF) ×2
CUFF TRNQT CYL 34X4X40X1 (TOURNIQUET CUFF) ×1 IMPLANT
DECANTER SPIKE VIAL GLASS SM (MISCELLANEOUS) ×3 IMPLANT
DERMABOND ADVANCED (GAUZE/BANDAGES/DRESSINGS) ×2
DERMABOND ADVANCED .7 DNX12 (GAUZE/BANDAGES/DRESSINGS) ×1 IMPLANT
DRAPE U-SHAPE 47X51 STRL (DRAPES) ×3 IMPLANT
DRSG ADAPTIC 3X8 NADH LF (GAUZE/BANDAGES/DRESSINGS) ×3 IMPLANT
DRSG TEGADERM 4X4.75 (GAUZE/BANDAGES/DRESSINGS) ×3 IMPLANT
DURAPREP 26ML APPLICATOR (WOUND CARE) ×3 IMPLANT
ELECT REM PT RETURN 15FT ADLT (MISCELLANEOUS) ×3 IMPLANT
EVACUATOR 1/8 PVC DRAIN (DRAIN) ×3 IMPLANT
GAUZE SPONGE 4X4 12PLY STRL (GAUZE/BANDAGES/DRESSINGS) ×3 IMPLANT
GLOVE BIO SURGEON STRL SZ8 (GLOVE) ×3 IMPLANT
GLOVE BIOGEL PI IND STRL 6.5 (GLOVE) ×2 IMPLANT
GLOVE BIOGEL PI IND STRL 7.5 (GLOVE) ×4 IMPLANT
GLOVE BIOGEL PI IND STRL 8 (GLOVE) ×1 IMPLANT
GLOVE BIOGEL PI INDICATOR 6.5 (GLOVE) ×4
GLOVE BIOGEL PI INDICATOR 7.5 (GLOVE) ×8
GLOVE BIOGEL PI INDICATOR 8 (GLOVE) ×2
GLOVE SURG SS PI 6.5 STRL IVOR (GLOVE) ×9 IMPLANT
GLOVE SURG SS PI 7.0 STRL IVOR (GLOVE) ×3 IMPLANT
GOWN STRL REUS W/ TWL XL LVL3 (GOWN DISPOSABLE) ×1 IMPLANT
GOWN STRL REUS W/TWL LRG LVL3 (GOWN DISPOSABLE) ×6 IMPLANT
GOWN STRL REUS W/TWL XL LVL3 (GOWN DISPOSABLE) ×5 IMPLANT
HANDPIECE INTERPULSE COAX TIP (DISPOSABLE) ×2
IMMOBILIZER KNEE 20 (SOFTGOODS) ×3
IMMOBILIZER KNEE 20 THIGH 36 (SOFTGOODS) ×1 IMPLANT
MANIFOLD NEPTUNE II (INSTRUMENTS) ×3 IMPLANT
PACK TOTAL KNEE CUSTOM (KITS) ×3 IMPLANT
PAD ABD 8X10 STRL (GAUZE/BANDAGES/DRESSINGS) ×3 IMPLANT
PADDING CAST COTTON 6X4 STRL (CAST SUPPLIES) ×3 IMPLANT
POSITIONER SURGICAL ARM (MISCELLANEOUS) ×3 IMPLANT
SET HNDPC FAN SPRY TIP SCT (DISPOSABLE) ×1 IMPLANT
STRIP CLOSURE SKIN 1/2X4 (GAUZE/BANDAGES/DRESSINGS) ×2 IMPLANT
SUT MNCRL AB 4-0 PS2 18 (SUTURE) ×3 IMPLANT
SUT STRATAFIX 0 PDS 27 VIOLET (SUTURE) ×3
SUT VIC AB 2-0 CT1 27 (SUTURE) ×6
SUT VIC AB 2-0 CT1 TAPERPNT 27 (SUTURE) ×3 IMPLANT
SUTURE STRATFX 0 PDS 27 VIOLET (SUTURE) ×1 IMPLANT
SYR 30ML LL (SYRINGE) ×6 IMPLANT
TRAY FOLEY CATH SILVER 14FR (SET/KITS/TRAYS/PACK) ×3 IMPLANT
WRAP KNEE MAXI GEL POST OP (GAUZE/BANDAGES/DRESSINGS) ×3 IMPLANT
YANKAUER SUCT BULB TIP 10FT TU (MISCELLANEOUS) ×3 IMPLANT

## 2017-10-02 NOTE — Anesthesia Procedure Notes (Signed)
Spinal  Patient location during procedure: OR Start time: 10/02/2017 12:40 PM End time: 10/02/2017 12:50 PM Staffing Anesthesiologist: Murvin Natal, MD Performed: anesthesiologist  Preanesthetic Checklist Completed: patient identified, surgical consent, pre-op evaluation, timeout performed, IV checked, risks and benefits discussed and monitors and equipment checked Spinal Block Patient position: sitting Prep: DuraPrep Patient monitoring: cardiac monitor, continuous pulse ox and blood pressure Approach: left paramedian Location: L4-5 Injection technique: single-shot Needle Needle type: Pencan  Needle gauge: 24 G Needle length: 9 cm Assessment Sensory level: T10 Additional Notes Functioning IV was confirmed and monitors were applied. Sterile prep and drape, including hand hygiene and sterile gloves were used. The patient was positioned and the spine was prepped. The skin was anesthetized with lidocaine.  Free flow of clear CSF was obtained prior to injecting local anesthetic into the CSF.  The spinal needle aspirated freely following injection.  The needle was carefully withdrawn.  The patient tolerated the procedure well.

## 2017-10-02 NOTE — Discharge Instructions (Addendum)
° °Dr. Frank Aluisio °Total Joint Specialist °[facility] °[address] °[place], [place] [address] °[phone] ° °TOTAL KNEE REPLACEMENT POSTOPERATIVE DIRECTIONS ° °Knee Rehabilitation, Guidelines Following Surgery  °Results after knee surgery are often greatly improved when you follow the exercise, range of motion and muscle strengthening exercises prescribed by your doctor. Safety measures are also important to protect the knee from further injury. Any time any of these exercises cause you to have increased pain or swelling in your knee joint, decrease the amount until you are comfortable again and slowly increase them. If you have problems or questions, call your caregiver or physical therapist for advice.  ° °HOME CARE INSTRUCTIONS  °Remove items at home which could result in a fall. This includes throw rugs or furniture in walking pathways.  °· ICE to the affected knee every three hours for 30 minutes at a time and then as needed for pain and swelling.  Continue to use ice on the knee for pain and swelling from surgery. You may notice swelling that will progress down to the foot and ankle.  This is normal after surgery.  Elevate the leg when you are not up walking on it.   °· Continue to use the breathing machine which will help keep your temperature down.  It is common for your temperature to cycle up and down following surgery, especially at night when you are not up moving around and exerting yourself.  The breathing machine keeps your lungs expanded and your temperature down. °· Do not place pillow under knee, focus on keeping the knee straight while resting ° °DIET °You may resume your previous home diet once your are discharged from the hospital. ° °DRESSING / WOUND CARE / SHOWERING °You may shower 3 days after surgery, but keep the wounds dry during showering.  You may use an occlusive plastic wrap (Press'n Seal for example), NO SOAKING/SUBMERGING IN THE BATHTUB.  If the  bandage gets wet, change with a clean dry gauze.  If the incision gets wet, pat the wound dry with a clean towel. °You may start showering once you are discharged home but do not submerge the incision under water. Just pat the incision dry and apply a dry gauze dressing on daily. °Change the surgical dressing daily and reapply a dry dressing each time. ° °ACTIVITY °Walk with your walker as instructed. °Use walker as long as suggested by your caregivers. °Avoid periods of inactivity such as sitting longer than an hour when not asleep. This helps prevent blood clots.  °You may resume a sexual relationship in one month or when given the OK by your doctor.  °You may return to work once you are cleared by your doctor.  °Do not drive a car for 6 weeks or until released by you surgeon.  °Do not drive while taking narcotics. ° °WEIGHT BEARING °Weight bearing as tolerated with assist device (walker, cane, etc) as directed, use it as long as suggested by your surgeon or therapist, typically at least 4-6 weeks. ° °POSTOPERATIVE CONSTIPATION PROTOCOL °Constipation - defined medically as fewer than three stools per week and severe constipation as less than one stool per week. ° °One of the most common issues patients have following surgery is constipation.  Even if you have a regular bowel pattern at home, your normal regimen is likely to be disrupted due to multiple reasons following surgery.  Combination of anesthesia, postoperative narcotics, change in appetite and fluid intake all can affect your bowels.    In order to avoid complications following surgery, here are some recommendations in order to help you during your recovery period. ° °Colace (docusate) - Pick up an over-the-counter form of Colace or another stool softener and take twice a day as long as you are requiring postoperative pain medications.  Take with a full glass of water daily.  If you experience loose stools or diarrhea, hold the colace until you stool forms  back up.  If your symptoms do not get better within 1 week or if they get worse, check with your doctor. ° °Dulcolax (bisacodyl) - Pick up over-the-counter and take as directed by the product packaging as needed to assist with the movement of your bowels.  Take with a full glass of water.  Use this product as needed if not relieved by Colace only.  ° °MiraLax (polyethylene glycol) - Pick up over-the-counter to have on hand.  MiraLax is a solution that will increase the amount of water in your bowels to assist with bowel movements.  Take as directed and can mix with a glass of water, juice, soda, coffee, or tea.  Take if you go more than two days without a movement. °Do not use MiraLax more than once per day. Call your doctor if you are still constipated or irregular after using this medication for 7 days in a row. ° °If you continue to have problems with postoperative constipation, please contact the office for further assistance and recommendations.  If you experience "the worst abdominal pain ever" or develop nausea or vomiting, please contact the office immediatly for further recommendations for treatment. ° °ITCHING ° If you experience itching with your medications, try taking only a single pain pill, or even half a pain pill at a time.  You can also use Benadryl over the counter for itching or also to help with sleep.  ° °TED HOSE STOCKINGS °Wear the elastic stockings on both legs for three weeks following surgery during the day but you may remove then at night for sleeping. ° °MEDICATIONS °See your medication summary on the “After Visit Summary” that the nursing staff will review with you prior to discharge.  You may have some home medications which will be placed on hold until you complete the course of blood thinner medication.  It is important for you to complete the blood thinner medication as prescribed by your surgeon.  Continue your approved medications as instructed at time of  discharge. ° °PRECAUTIONS °If you experience chest pain or shortness of breath - call 911 immediately for transfer to the hospital emergency department.  °If you develop a fever greater that 101 F, purulent drainage from wound, increased redness or drainage from wound, foul odor from the wound/dressing, or calf pain - CONTACT YOUR SURGEON.   °                                                °FOLLOW-UP APPOINTMENTS °Make sure you keep all of your appointments after your operation with your surgeon and caregivers. You should call the office at the above phone number and make an appointment for approximately two weeks after the date of your surgery or on the date instructed by your surgeon outlined in the "After Visit Summary". ° ° °RANGE OF MOTION AND STRENGTHENING EXERCISES  °Rehabilitation of the knee is important following a knee injury or   an operation. After just a few days of immobilization, the muscles of the thigh which control the knee become weakened and shrink (atrophy). Knee exercises are designed to build up the tone and strength of the thigh muscles and to improve knee motion. Often times heat used for twenty to thirty minutes before working out will loosen up your tissues and help with improving the range of motion but do not use heat for the first two weeks following surgery. These exercises can be done on a training (exercise) mat, on the floor, on a table or on a bed. Use what ever works the best and is most comfortable for you Knee exercises include:  °Leg Lifts - While your knee is still immobilized in a splint or cast, you can do straight leg raises. Lift the leg to 60 degrees, hold for 3 sec, and slowly lower the leg. Repeat 10-20 times 2-3 times daily. Perform this exercise against resistance later as your knee gets better.  °Quad and Hamstring Sets - Tighten up the muscle on the front of the thigh (Quad) and hold for 5-10 sec. Repeat this 10-20 times hourly. Hamstring sets are done by pushing the  foot backward against an object and holding for 5-10 sec. Repeat as with quad sets.  °· Leg Slides: Lying on your back, slowly slide your foot toward your buttocks, bending your knee up off the floor (only go as far as is comfortable). Then slowly slide your foot back down until your leg is flat on the floor again. °· Angel Wings: Lying on your back spread your legs to the side as far apart as you can without causing discomfort.  °A rehabilitation program following serious knee injuries can speed recovery and prevent re-injury in the future due to weakened muscles. Contact your doctor or a physical therapist for more information on knee rehabilitation.  ° °IF YOU ARE TRANSFERRED TO A SKILLED REHAB FACILITY °If the patient is transferred to a skilled rehab facility following release from the hospital, a list of the current medications will be sent to the facility for the patient to continue.  When discharged from the skilled rehab facility, please have the facility set up the patient's Home Health Physical Therapy prior to being released. Also, the skilled facility will be responsible for providing the patient with their medications at time of release from the facility to include their pain medication, the muscle relaxants, and their blood thinner medication. If the patient is still at the rehab facility at time of the two week follow up appointment, the skilled rehab facility will also need to assist the patient in arranging follow up appointment in our office and any transportation needs. ° °MAKE SURE YOU:  °Understand these instructions.  °Get help right away if you are not doing well or get worse.  ° ° °Pick up stool softner and laxative for home use following surgery while on pain medications. °Do not submerge incision under water. °Please use good hand washing techniques while changing dressing each day. °May shower starting three days after surgery. °Please use a clean towel to pat the incision dry following  showers. °Continue to use ice for pain and swelling after surgery. °Do not use any lotions or creams on the incision until instructed by your surgeon. ° °Take Xarelto for two and a half more weeks following discharge from the hospital, then discontinue Xarelto. °Once the patient has completed the Xarelto, they may resume the 81 mg Aspirin. ° ° ° °Information on   my medicine - XARELTO® (Rivaroxaban) ° °Why was Xarelto® prescribed for you? °Xarelto® was prescribed for you to reduce the risk of blood clots forming after orthopedic surgery. The medical term for these abnormal blood clots is venous thromboembolism (VTE). ° °What do you need to know about xarelto® ? °Take your Xarelto® ONCE DAILY at the same time every day. °You may take it either with or without food. ° °If you have difficulty swallowing the tablet whole, you may crush it and mix in applesauce just prior to taking your dose. ° °Take Xarelto® exactly as prescribed by your doctor and DO NOT stop taking Xarelto® without talking to the doctor who prescribed the medication.  Stopping without other VTE prevention medication to take the place of Xarelto® may increase your risk of developing a clot. ° °After discharge, you should have regular check-up appointments with your healthcare provider that is prescribing your Xarelto®.   ° °What do you do if you miss a dose? °If you miss a dose, take it as soon as you remember on the same day then continue your regularly scheduled once daily regimen the next day. Do not take two doses of Xarelto® on the same day.  ° °Important Safety Information °A possible side effect of Xarelto® is bleeding. You should call your healthcare provider right away if you experience any of the following: °? Bleeding from an injury or your nose that does not stop. °? Unusual colored urine (red or dark brown) or unusual colored stools (red or black). °? Unusual bruising for unknown reasons. °? A serious fall or if you hit your head (even if  there is no bleeding). ° °Some medicines may interact with Xarelto® and might increase your risk of bleeding while on Xarelto®. To help avoid this, consult your healthcare provider or pharmacist prior to using any new prescription or non-prescription medications, including herbals, vitamins, non-steroidal anti-inflammatory drugs (NSAIDs) and supplements. ° °This website has more information on Xarelto®: www.xarelto.com. ° ° ° °

## 2017-10-02 NOTE — Anesthesia Procedure Notes (Signed)
Procedure Name: MAC Date/Time: 10/02/2017 11:47 AM Performed by: Lissa Morales, CRNA Pre-anesthesia Checklist: Patient identified, Emergency Drugs available, Patient being monitored, Timeout performed and Suction available Oxygen Delivery Method: Simple face mask Placement Confirmation: positive ETCO2 Dental Injury: Teeth and Oropharynx as per pre-operative assessment

## 2017-10-02 NOTE — Op Note (Signed)
Pre-operative diagnosis- Osteoarthritis Left knee(s)  Post-operative diagnosis- Osteoarthritis  Left knee(s)  Procedure-   Left Total Knee Arthroplasty  Surgeon- Dione Plover. Zaeem Kandel, MD  Assistant- Ardeen Jourdain, PA-C   Anesthesia-  Adductor canal block and spinal   EBL- 100 mL   Drains Hemovac x 1   Tourniquet time  Total Tourniquet Time Documented: Thigh (Left) - 33 minutes Total: Thigh (Left) - 33 minutes    Complications- None  Condition-PACU - hemodynamically stable.   Brief Clinical Note  [name] is a 84 y.o. year old female with end stage OA of her left knee with progressively worsening pain and dysfunction. She has constant pain, with activity and at rest and significant functional deficits with difficulties even with ADLs. She has had extensive non-op management including analgesics, injections of cortisone and viscosupplements, and home exercise program, but remains in significant pain with significant dysfunction. Radiographs show bone on bone arthritis lateral and patellofemoral with large valgus deformity. She presents now for left Total Knee Arthroplasty.    Procedure in detail---       The patient is brought into the operating room and positioned supine on the operating table. After successful administration of Adductor canal block and spinal anesthetic, a tourniquet is placed high on the Left thigh(s) and the lower extremity is prepped and draped in the usual sterile fashion. Time out is performed by the operating team and then the Left  lower extremity is wrapped in Esmarch, knee flexed and the tourniquet inflated to 300 mmHg.       A midline incision is made with a ten blade through the subcutaneous tissue to the level of the extensor mechanism. A fresh blade is used to make a lateral parapatellar arthrotomy due to the patients' valgus deformity. Soft tissue over the proximal lateral tibia is subperiosteally elevated to the joint line with a knife to the  posterolateral corner but not including the structures of the posterolateral corner. Soft tissue over the proximal medial tibia is elevated with attention being paid to avoiding the patellar tendon on the tibial tubercle. The patella is everted medially, knee flexed 90 degrees and the ACL and PCL are removed. Findings are bone on bone lateral and patellofemoral with massive lateral osteophytes. .       The drill is used to create a starting hole in the distal femur and the canal is thoroughly irrigated with sterile saline to remove the fatty contents. The 5 degree Left  valgus alignment guide is placed into the femoral canal and the distal femoral cutting block is pinned to remove 10  mm off the distal femur. Resection is made with an oscillating saw.      The tibia is subluxed forward and the menisci are removed. The extramedullary alignment guide is placed referencing proximally at the medial aspect of the tibial tubercle and distally along the second metatarsal axis and tibial crest. The block is pinned to remove 22mm off the more deficient lateral side. Resection is made with an oscillating saw. Size 3  is the most appropriate size for the tibia and the proximal tibia is prepared with the modular drill and keel punch for that size.      The femoral sizing guide is placed and size 3  is most appropriate. Rotation is marked off the epicondylar axis and confirmed by creating a rectangular flexion gap at 90 degrees. The size 3  cutting block is pinned in this rotation and the anterior, posterior and chamfer cuts  are made with the oscillating saw. The intercondylar block is then placed and that cut is made.      Trial size 3 tibial component, trial size 3  posterior stabilized femur and a 12.5  mm posterior stabilized rotating platform insert trial is placed. Full extension is achieved with excellent varus/valgus and   anterior/posterior balance throughout full range of motion. The patella is everted and thickness  measured to be 22  mm. Free hand resection is taken to 12 mm, a 35 template is placed, lug holes are drilled, trial patella is placed, and it tracks normally. Osteophytes are removed off the posterior femur with the trial in place. All trials are removed and the cut bone surfaces prepared with pulsatile lavage. Cement is mixed and once ready for implantation, the size 3  tibial implant, size 3 posterior stabilized femoral component, and the size 35  patella are cemented in place and the patella is held with the clamp. The trial insert is placed and the knee held in full extension. The Exparel (20 ml mixed with 60 ml saline)  is injected into the extensor mechanism, posterior capsule, medial and lateral gutters and subcutaneous tissues. All extruded cement is removed and once the cement is hard the permanent 12.5  mm posterior stabilized rotating platform insert is placed into the tibial tray.      The wound is copiously irrigated with saline solution and the tourniquet is released for a total   tourniquet time of 33  minutes. Bleeding is identified and controlled with electrocautery. The extensor mechanism is closed with interrupted #1 V-loc leaving open a small area from the superior to inferior pole of the patella to serve as a mini lateral release. Flexion against gravity is 140  degrees and the patella tracks normally. Subcutaneous tissue is closed with 2.0 vicryl and subcuticular with running 4.0 Monocryl.The incision is cleaned and dried and steri-strips and a bulky sterile dressing are applied. The limb is placed into a knee immobilizer and the patient is awakened and transported to recovery in stable condition.      Please note that a surgical assistant was a medical necessity for this procedure in order to perform it in a safe and expeditious manner. Surgical assistant was necessary to retract the ligaments and vital neurovascular structures to prevent injury to them and also necessary for proper  positioning of the limb to allow for anatomic placement of the prosthesis.    Dione Plover Greidy Sherard, MD    10/02/2017, 1:00 PM

## 2017-10-02 NOTE — Anesthesia Procedure Notes (Signed)
Anesthesia Regional Block: Adductor canal block   Pre-Anesthetic Checklist: ,, timeout performed, Correct Patient, Correct Site, Correct Laterality, Correct Procedure,, site marked, risks and benefits discussed, Surgical consent,  Pre-op evaluation,  At surgeon's request and post-op pain management  Laterality: Left  Prep: chloraprep       Needles:  Injection technique: Single-shot  Needle Type: Echogenic Stimulator Needle     Needle Length: 9cm  Needle Gauge: 21     Additional Needles:   Procedures:,,,, ultrasound used (permanent image in chart),,,,  Narrative:  Start time: 10/02/2017 10:40 AM End time: 10/02/2017 10:50 AM Injection made incrementally with aspirations every 5 mL.  Performed by: Personally  Anesthesiologist: Murvin Natal, MD  Additional Notes: Functioning IV was confirmed and monitors were applied.  A 8mm 21ga Arrow echogenic stimulator needle was used. Sterile prep, hand hygiene and sterile gloves were used.  Negative aspiration and negative test dose prior to incremental administration of local anesthetic. The patient tolerated the procedure well.

## 2017-10-02 NOTE — Progress Notes (Signed)
Assisted Dr. Ellender with left, ultrasound guided, adductor canal block. Side rails up, monitors on throughout procedure. See vital signs in flow sheet. Tolerated Procedure well.  

## 2017-10-02 NOTE — Interval H&P Note (Signed)
History and Physical Interval Note:  10/02/2017 9:47 AM  [name]  has presented today for surgery, with the diagnosis of Oteoarthritis Left knee  The various methods of treatment have been discussed with the patient and family. After consideration of risks, benefits and other options for treatment, the patient has consented to  Procedure(s): LEFT TOTAL KNEE ARTHROPLASTY (Left) as a surgical intervention .  The patient's history has been reviewed, patient examined, no change in status, stable for surgery.  I have reviewed the patient's chart and labs.  Questions were answered to the patient's satisfaction.     Pilar Plate Osa Campoli

## 2017-10-02 NOTE — Transfer of Care (Signed)
Immediate Anesthesia Transfer of Care Note  Patient: [name]  Procedure(s) Performed: Procedure(s) with comments: LEFT TOTAL KNEE ARTHROPLASTY (Left) - Adductor Block  Patient Location: PACU  Anesthesia Type:Spinal  Level of Consciousness:  sedated, patient cooperative and responds to stimulation  Airway & Oxygen Therapy:Patient Spontanous Breathing and Patient connected to face mask oxgen  Post-op Assessment:  Report given to PACU RN and Post -op Vital signs reviewed and stable  Post vital signs:  Reviewed and stable  Last Vitals:  Vitals:   10/02/17 1050 10/02/17 1100  BP: (!) 162/69 (!) 182/82  Pulse: 82 87  Resp: 14 (!) 23  Temp:    SpO2: 117% 356%    Complications: No apparent anesthesia complications

## 2017-10-02 NOTE — Anesthesia Preprocedure Evaluation (Addendum)
Anesthesia Evaluation  Patient identified by MRN, date of birth, ID band Patient awake    Reviewed: Allergy & Precautions, NPO status , Patient's Chart, lab work & pertinent test results  History of Anesthesia Complications (+) PONV and history of anesthetic complications  Airway Mallampati: II  TM Distance: >3 FB Neck ROM: Full    Dental  (+) Edentulous Upper, Edentulous Lower   Pulmonary neg pulmonary ROS,    breath sounds clear to auscultation       Cardiovascular hypertension, Pt. on medications (-) angina Rhythm:Regular Rate:Normal  ECG: NSR, rate 78  '08 ECHO: EF 60-65%, mild MR, mild AI   Neuro/Psych TIAnegative psych ROS   GI/Hepatic negative GI ROS, Neg liver ROS,   Endo/Other  negative endocrine ROS  Renal/GU negative Renal ROS     Musculoskeletal  (+) Arthritis ,   Abdominal   Peds  Hematology  (+) anemia , HLD   Anesthesia Other Findings   Reproductive/Obstetrics                            Anesthesia Physical  Anesthesia Plan  ASA: III  Anesthesia Plan: Spinal and Regional   Post-op Pain Management:  Regional for Post-op pain   Induction: Intravenous  PONV Risk Score and Plan: 3 and Propofol infusion, Treatment may vary due to age or medical condition and Ondansetron  Airway Management Planned: Natural Airway  Additional Equipment:   Intra-op Plan:   Post-operative Plan:   Informed Consent: I have reviewed the patients History and Physical, chart, labs and discussed the procedure including the risks, benefits and alternatives for the proposed anesthesia with the patient or authorized representative who has indicated his/her understanding and acceptance.   Dental advisory given  Plan Discussed with: CRNA  Anesthesia Plan Comments:         Anesthesia Quick Evaluation

## 2017-10-02 NOTE — Anesthesia Postprocedure Evaluation (Signed)
Anesthesia Post Note  Patient: ALVIE SPELTZ  Procedure(s) Performed: LEFT TOTAL KNEE ARTHROPLASTY (Left Knee)     Patient location during evaluation: PACU Anesthesia Type: Regional and Spinal Level of consciousness: oriented and awake and alert Pain management: pain level controlled Vital Signs Assessment: post-procedure vital signs reviewed and stable Respiratory status: spontaneous breathing, respiratory function stable and patient connected to nasal cannula oxygen Cardiovascular status: blood pressure returned to baseline and stable Postop Assessment: no headache, no backache, no apparent nausea or vomiting and spinal receding Anesthetic complications: no    Last Vitals:  Vitals:   10/02/17 1630 10/02/17 1650  BP: (!) 145/71 (!) 163/69  Pulse: 81 81  Resp: 18 15  Temp: 37 C 37 C  SpO2: 100% 100%    Last Pain:  Vitals:   10/02/17 1720  TempSrc:   PainSc: 5                  Fenton Candee P Stanlee Roehrig

## 2017-10-03 LAB — BASIC METABOLIC PANEL
ANION GAP: 8 (ref 5–15)
ANION GAP: 9 (ref 5–15)
BUN: 17 mg/dL (ref 6–20)
BUN: 18 mg/dL (ref 6–20)
CALCIUM: 8.4 mg/dL — AB (ref 8.9–10.3)
CALCIUM: 8.5 mg/dL — AB (ref 8.9–10.3)
CO2: 26 mmol/L (ref 22–32)
CO2: 25 mmol/L (ref 22–32)
Chloride: 99 mmol/L — ABNORMAL LOW (ref 101–111)
Chloride: 99 mmol/L — ABNORMAL LOW (ref 101–111)
Creatinine, Ser: 0.8 mg/dL (ref 0.44–1.00)
Creatinine, Ser: 0.92 mg/dL (ref 0.44–1.00)
GFR, EST NON AFRICAN AMERICAN: 56 mL/min — AB (ref >60)
GLUCOSE: 135 mg/dL — AB (ref 65–99)
GLUCOSE: 188 mg/dL — AB (ref 65–99)
POTASSIUM: 2.9 mmol/L — AB (ref 3.5–5.1)
POTASSIUM: 3.4 mmol/L — AB (ref 3.5–5.1)
Sodium: 133 mmol/L — ABNORMAL LOW (ref 135–145)
Sodium: 133 mmol/L — ABNORMAL LOW (ref 135–145)

## 2017-10-03 LAB — CBC
HEMATOCRIT: 27.8 % — AB (ref 36.0–46.0)
Hemoglobin: 9.5 g/dL — ABNORMAL LOW (ref 12.0–15.0)
MCH: 30.9 pg (ref 26.0–34.0)
MCHC: 34.2 g/dL (ref 30.0–36.0)
MCV: 90.6 fL (ref 78.0–100.0)
PLATELETS: 318 10*3/uL (ref 150–400)
RBC: 3.07 MIL/uL — AB (ref 3.87–5.11)
RDW: 13.3 % (ref 11.5–15.5)
WBC: 16.3 10*3/uL — ABNORMAL HIGH (ref 4.0–10.5)

## 2017-10-03 MED ORDER — METHOCARBAMOL 500 MG PO TABS: 500.0000 mg | ORAL_TABLET | Freq: Four times a day (QID) | ORAL | 0 refills | Status: DC | PRN

## 2017-10-03 MED ORDER — HYDROMORPHONE HCL 2 MG PO TABS: 2.0000 mg | ORAL_TABLET | ORAL | 0 refills | Status: DC | PRN

## 2017-10-03 MED ORDER — TRAMADOL HCL 50 MG PO TABS: 50.0000 mg | ORAL_TABLET | Freq: Four times a day (QID) | ORAL | 0 refills | Status: DC | PRN

## 2017-10-03 MED ORDER — POLYSACCHARIDE IRON COMPLEX 150 MG PO CAPS: 150.0000 mg | ORAL_CAPSULE | Freq: Every day | ORAL | 0 refills | Status: DC

## 2017-10-03 MED ORDER — POLYSACCHARIDE IRON COMPLEX 150 MG PO CAPS
150.0000 mg | ORAL_CAPSULE | Freq: Every day | ORAL | Status: DC
  Administered 2017-10-03: 150 mg via ORAL
  Filled 2017-10-03: qty 1

## 2017-10-03 MED ORDER — RIVAROXABAN 10 MG PO TABS: 10.0000 mg | ORAL_TABLET | Freq: Every day | ORAL | 0 refills | Status: DC

## 2017-10-03 MED ORDER — POTASSIUM CHLORIDE CRYS ER 20 MEQ PO TBCR
40.0000 meq | EXTENDED_RELEASE_TABLET | ORAL | Status: AC
  Administered 2017-10-03 (×2): 40 meq via ORAL
  Filled 2017-10-03 (×2): qty 2

## 2017-10-03 NOTE — Progress Notes (Signed)
Discharge planning, no HH needs identified. Plan for OP PT, has DME. [phone] 

## 2017-10-03 NOTE — Progress Notes (Signed)
OT Evaluation  PTA, pt lived alone and was very active and independent with mobility and ADL. Pt making excellent progress. Daughter will be able to assist pt after DC as needed. Will follow acutely to complete education for ADL and functional mobility to facilitate safe DC home.    10/03/17 1000  OT Visit Information  Last OT Received On 10/03/17  Assistance Needed +1  History of Present Illness 84 yo female s/p L TKA 10/02/17. Hx of R TKA and R THA  Precautions  Precautions Knee  Required Braces or Orthoses Knee Immobilizer - Left  Knee Immobilizer - Left Discontinue once straight leg raise with < 10 degree lag  Restrictions  Weight Bearing Restrictions No  LLE Weight Bearing WBAT  Home Living  Family/patient expects to be discharged to: Private residence  Living Arrangements Alone  Available Help at Discharge Family;Available 24 hours/day  Type of Home House  Home Access Stairs to enter  Entrance Stairs-Number of Steps 2  Entrance Stairs-Rails Right  Home Layout One level  Bathroom Shower/Tub Walk-in Cytogeneticist Yes  Home Equipment Walker - 2 wheels;Grab bars - tub/shower;BSC;Cane - single point  Prior Function  Level of Independence Independent  Comments drives  Communication  Communication No difficulties  Pain Assessment  Pain Assessment 0-10  Pain Score 5  Pain Location L knee  Pain Descriptors / Indicators Aching;Sore  Pain Intervention(s) Limited activity within patient's tolerance  Cognition  Arousal/Alertness Awake/alert  Behavior During Therapy WFL for tasks assessed/performed  Overall Cognitive Status Within Functional Limits for tasks assessed  Upper Extremity Assessment  Upper Extremity Assessment (R shoulder deficits PTA)  Lower Extremity Assessment  Lower Extremity Assessment Defer to PT evaluation  Cervical / Trunk Assessment  Cervical / Trunk Assessment Normal  Vision- History  Baseline Vision/History  Wears glasses  Wears Glasses At all times  Bed Mobility  Overal bed mobility Modified Independent  Transfers  Overall transfer level Needs assistance  General Comments  General comments (skin integrity, edema, etc.) Educated on knee precuations/  no pillowunder knee and importance of terminal extension  OT - End of Session  Activity Tolerance Patient tolerated treatment well  Patient left Other (comment) (With PT sittin gEOB)  OT Assessment  OT Recommendation/Assessment Patient needs continued OT Services  OT Visit Diagnosis Other abnormalities of gait and mobility (R26.89);Pain;Muscle weakness (generalized) (M62.81)  Pain - Right/Left Left  Pain - part of body Knee  OT Problem List Decreased strength;Decreased range of motion;Decreased safety awareness;Decreased knowledge of use of DME or AE;Decreased knowledge of precautions;Pain  OT Plan  OT Frequency (ACUTE ONLY) Min 2X/week  OT Treatment/Interventions (ACUTE ONLY) Self-care/ADL training;DME and/or AE instruction;Therapeutic activities;Patient/family education  AM-PAC OT "6 Clicks" Daily Activity Outcome Measure  Help from another person eating meals? 4  Help from another person taking care of personal grooming? 4  Help from another person toileting, which includes using toliet, bedpan, or urinal? 3  Help from another person bathing (including washing, rinsing, drying)? 3  Help from another person to put on and taking off regular upper body clothing? 4  Help from another person to put on and taking off regular lower body clothing? 3  6 Click Score 21  ADL G Code Conversion CJ  OT Recommendation  Follow Up Recommendations No OT follow up;Supervision - Intermittent  OT Equipment None recommended by OT  Individuals Consulted  Consulted and Agree with Results and Recommendations Patient;Family member/caregiver  Family Member Consulted daughter  Acute Rehab OT Goals  Patient Stated Goal to be independent  OT Goal Formulation With  patient  Time For Goal Achievement 10/17/17  Potential to Achieve Goals Good  OT Time Calculation  OT Start Time (ACUTE ONLY) 1030  OT Stop Time (ACUTE ONLY) 1050  OT Time Calculation (min) 20 min  OT General Charges  $OT Visit 1 Visit  OT Evaluation  $OT Eval Low Complexity 1 Low  Written Expression  Dominant Hand Right  [facility], OT/L  [phone] 10/03/2017

## 2017-10-03 NOTE — Progress Notes (Signed)
   Subjective: 1 Day Post-Op Procedure(s) (LRB): LEFT TOTAL KNEE ARTHROPLASTY (Left) Patient reports pain as mild.   Patient seen in rounds for Dr. Wynelle Link. Patient is well, but has had some minor complaints of pain in the knee, requiring pain medications We will start therapy today.  Plan is to go Home after hospital stay.  Objective: Vital signs in last 24 hours: Temp:  [97.5 F (36.4 C)-98.6 F (37 C)] 98 F (36.7 C) (01/08 2045) Pulse Rate:  [67-88] 72 (01/08 2045) Resp:  [16-18] 18 (01/08 2045) BP: (106-129)/(63-83) 106/76 (01/08 2045) SpO2:  [98 %-100 %] 98 % (01/08 2045)  Intake/Output from previous day:  Intake/Output Summary (Last 24 hours) at 10/03/2017 2154 Last data filed at 10/03/2017 2145 Gross per 24 hour  Intake 2526.25 ml  Output 1100 ml  Net 1426.25 ml    Intake/Output this shift: Total I/O In: 180 [P.O.:180] Out: -   Labs: Recent Labs    10/03/17 0556  HGB 9.5*   Recent Labs    10/03/17 0556  WBC 16.3*  RBC 3.07*  HCT 27.8*  PLT 318   Recent Labs    10/03/17 0556 10/03/17 1224  NA 133* 133*  K 2.9* 3.4*  CL 99* 99*  CO2 26 25  BUN 17 18  CREATININE 0.80 0.92  GLUCOSE 135* 188*  CALCIUM 8.4* 8.5*   No results for input(s): LABPT, INR in the last 72 hours.  EXAM General - Patient is Alert, Appropriate and Oriented Extremity - Neurovascular intact Sensation intact distally Intact pulses distally Dorsiflexion/Plantar flexion intact Dressing - dressing C/D/I Motor Function - intact, moving foot and toes well on exam.  Hemovac pulled without difficulty.  Past Medical History:  Diagnosis Date  . Arthritis    "knees, hips, right foot, right hand" (09/01/2015)  . History of blood transfusion 2006   "w/knee OR  . Hyperlipidemia   . Hypertension   . Oral thrush   . Pneumonia    history of  . PONV (postoperative nausea and vomiting)    "just w/knee OR in 2006"  . 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 TIA (transient  ischemic attack)    per ECHO 2008    Assessment/Plan: 1 Day Post-Op Procedure(s) (LRB): LEFT TOTAL KNEE ARTHROPLASTY (Left) Principal Problem:   OA (osteoarthritis) of knee  Estimated body mass index is 23.23 kg/m as calculated from the following:   Height as of this encounter: 5\' 2"  (1.575 m).   Weight as of this encounter: 57.6 kg (127 lb). Up with therapy Plan for discharge tomorrow  DVT Prophylaxis - Xarelto Weight-Bearing as tolerated to left leg D/C O2 and Pulse OX and try on Room Air  Arlee Muslim, PA-C Orthopaedic Surgery 10/03/2017, 9:54 PM

## 2017-10-03 NOTE — Discharge Summary (Signed)
Physician Discharge Summary   Patient ID: [name] MRN: [id number] DOB/AGE: 1931/01/03 84 y.o.  Admit date: 10/02/2017 Discharge date: 10-05-2017  Primary Diagnosis:  Osteoarthritis  Left knee(s)   Admission Diagnoses:  Past Medical History:  Diagnosis Date  . Arthritis    "knees, hips, right foot, right hand" (09/01/2015)  . History of blood transfusion 2006   "w/knee OR  . Hyperlipidemia   . Hypertension   . Oral thrush   . Pneumonia    history of  . PONV (postoperative nausea and vomiting)    "just w/knee OR in 2006"  . 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 TIA (transient ischemic attack)    per ECHO 2008   Discharge Diagnoses:   Principal Problem:   OA (osteoarthritis) of knee  Estimated body mass index is 23.23 kg/m as calculated from the following:   Height as of this encounter: '5\' 2"'  (1.575 m).   Weight as of this encounter: 57.6 kg (127 lb).  Procedure:  Procedure(s) (LRB): LEFT TOTAL KNEE ARTHROPLASTY (Left)   Consults: None  HPI: [name] is a 84 y.o. year old female with end stage OA of her left knee with progressively worsening pain and dysfunction. She has constant pain, with activity and at rest and significant functional deficits with difficulties even with ADLs. She has had extensive non-op management including analgesics, injections of cortisone and viscosupplements, and home exercise program, but remains in significant pain with significant dysfunction. Radiographs show bone on bone arthritis lateral and patellofemoral with large valgus deformity. She presents now for left Total Knee Arthroplasty.    Laboratory Data: Admission on 10/02/2017  Component Date Value Ref Range Status  . WBC 10/03/2017 16.3* 4.0 - 10.5 K/uL Final  . RBC 10/03/2017 3.07* 3.87 - 5.11 MIL/uL Final  . Hemoglobin 10/03/2017 9.5* 12.0 - 15.0 g/dL Final  . HCT 10/03/2017 27.8* 36.0 - 46.0 % Final  . MCV 10/03/2017 90.6  78.0 - 100.0 fL Final  .  MCH 10/03/2017 30.9  26.0 - 34.0 pg Final  . MCHC 10/03/2017 34.2  30.0 - 36.0 g/dL Final  . RDW 10/03/2017 13.3  11.5 - 15.5 % Final  . Platelets 10/03/2017 318  150 - 400 K/uL Final  . Sodium 10/03/2017 133* 135 - 145 mmol/L Final  . Potassium 10/03/2017 2.9* 3.5 - 5.1 mmol/L Final  . Chloride 10/03/2017 99* 101 - 111 mmol/L Final  . CO2 10/03/2017 26  22 - 32 mmol/L Final  . Glucose, Bld 10/03/2017 135* 65 - 99 mg/dL Final  . BUN 10/03/2017 17  6 - 20 mg/dL Final  . Creatinine, Ser 10/03/2017 0.80  0.44 - 1.00 mg/dL Final  . Calcium 10/03/2017 8.4* 8.9 - 10.3 mg/dL Final  . GFR calc non Af Amer 10/03/2017 >60  >60 mL/min Final  . GFR calc Af Amer 10/03/2017 >60  >60 mL/min Final   Comment: (NOTE) The eGFR has been calculated using the CKD EPI equation. This calculation has not been validated in all clinical situations. eGFR's persistently <60 mL/min signify possible Chronic Kidney Disease.   . Anion gap 10/03/2017 8  5 - 15 Final  . Sodium 10/03/2017 133* 135 - 145 mmol/L Final  . Potassium 10/03/2017 3.4* 3.5 - 5.1 mmol/L Final  . Chloride 10/03/2017 99* 101 - 111 mmol/L Final  . CO2 10/03/2017 25  22 - 32 mmol/L Final  . Glucose, Bld 10/03/2017 188* 65 - 99 mg/dL Final  . BUN  10/03/2017 18  6 - 20 mg/dL Final  . Creatinine, Ser 10/03/2017 0.92  0.44 - 1.00 mg/dL Final  . Calcium 10/03/2017 8.5* 8.9 - 10.3 mg/dL Final  . GFR calc non Af Amer 10/03/2017 56* >60 mL/min Final  . GFR calc Af Amer 10/03/2017 >60  >60 mL/min Final   Comment: (NOTE) The eGFR has been calculated using the CKD EPI equation. This calculation has not been validated in all clinical situations. eGFR's persistently <60 mL/min signify possible Chronic Kidney Disease.   . Anion gap 10/03/2017 9  5 - 15 Final  . WBC 10/04/2017 17.6* 4.0 - 10.5 K/uL Final  . RBC 10/04/2017 2.80* 3.87 - 5.11 MIL/uL Final  . Hemoglobin 10/04/2017 8.8* 12.0 - 15.0 g/dL Final  . HCT 10/04/2017 25.5* 36.0 - 46.0 % Final  . MCV  10/04/2017 91.1  78.0 - 100.0 fL Final  . MCH 10/04/2017 31.4  26.0 - 34.0 pg Final  . MCHC 10/04/2017 34.5  30.0 - 36.0 g/dL Final  . RDW 10/04/2017 13.7  11.5 - 15.5 % Final  . Platelets 10/04/2017 317  150 - 400 K/uL Final  . Sodium 10/04/2017 133* 135 - 145 mmol/L Final  . Potassium 10/04/2017 4.1  3.5 - 5.1 mmol/L Final   Comment: DELTA CHECK NOTED REPEATED TO VERIFY NO VISIBLE HEMOLYSIS   . Chloride 10/04/2017 100* 101 - 111 mmol/L Final  . CO2 10/04/2017 26  22 - 32 mmol/L Final  . Glucose, Bld 10/04/2017 110* 65 - 99 mg/dL Final  . BUN 10/04/2017 20  6 - 20 mg/dL Final  . Creatinine, Ser 10/04/2017 0.86  0.44 - 1.00 mg/dL Final  . Calcium 10/04/2017 8.8* 8.9 - 10.3 mg/dL Final  . GFR calc non Af Amer 10/04/2017 >60  >60 mL/min Final  . GFR calc Af Amer 10/04/2017 >60  >60 mL/min Final   Comment: (NOTE) The eGFR has been calculated using the CKD EPI equation. This calculation has not been validated in all clinical situations. eGFR's persistently <60 mL/min signify possible Chronic Kidney Disease.   . Anion gap 10/04/2017 7  5 - 15 Final  . WBC 10/05/2017 17.8* 4.0 - 10.5 K/uL Final  . RBC 10/05/2017 2.73* 3.87 - 5.11 MIL/uL Final  . Hemoglobin 10/05/2017 8.7* 12.0 - 15.0 g/dL Final  . HCT 10/05/2017 24.9* 36.0 - 46.0 % Final  . MCV 10/05/2017 91.2  78.0 - 100.0 fL Final  . MCH 10/05/2017 31.9  26.0 - 34.0 pg Final  . MCHC 10/05/2017 34.9  30.0 - 36.0 g/dL Final  . RDW 10/05/2017 13.8  11.5 - 15.5 % Final  . Platelets 10/05/2017 304  150 - 400 K/uL Final  Hospital Outpatient Visit on 09/27/2017  Component Date Value Ref Range Status  . aPTT 09/27/2017 34  24 - 36 seconds Final  . WBC 09/27/2017 8.0  4.0 - 10.5 K/uL Final  . RBC 09/27/2017 3.85* 3.87 - 5.11 MIL/uL Final  . Hemoglobin 09/27/2017 11.9* 12.0 - 15.0 g/dL Final  . HCT 09/27/2017 35.1* 36.0 - 46.0 % Final  . MCV 09/27/2017 91.2  78.0 - 100.0 fL Final  . MCH 09/27/2017 30.9  26.0 - 34.0 pg Final  . MCHC  09/27/2017 33.9  30.0 - 36.0 g/dL Final  . RDW 09/27/2017 13.2  11.5 - 15.5 % Final  . Platelets 09/27/2017 383  150 - 400 K/uL Final  . Sodium 09/27/2017 133* 135 - 145 mmol/L Final  . Potassium 09/27/2017 4.2  3.5 - 5.1 mmol/L  Final  . Chloride 09/27/2017 95* 101 - 111 mmol/L Final  . CO2 09/27/2017 29  22 - 32 mmol/L Final  . Glucose, Bld 09/27/2017 109* 65 - 99 mg/dL Final  . BUN 09/27/2017 20  6 - 20 mg/dL Final  . Creatinine, Ser 09/27/2017 0.86  0.44 - 1.00 mg/dL Final  . Calcium 09/27/2017 9.8  8.9 - 10.3 mg/dL Final  . Total Protein 09/27/2017 7.8  6.5 - 8.1 g/dL Final  . Albumin 09/27/2017 4.2  3.5 - 5.0 g/dL Final  . AST 09/27/2017 25  15 - 41 U/L Final  . ALT 09/27/2017 20  14 - 54 U/L Final  . Alkaline Phosphatase 09/27/2017 60  38 - 126 U/L Final  . Total Bilirubin 09/27/2017 0.8  0.3 - 1.2 mg/dL Final  . GFR calc non Af Amer 09/27/2017 >60  >60 mL/min Final  . GFR calc Af Amer 09/27/2017 >60  >60 mL/min Final   Comment: (NOTE) The eGFR has been calculated using the CKD EPI equation. This calculation has not been validated in all clinical situations. eGFR's persistently <60 mL/min signify possible Chronic Kidney Disease.   . Anion gap 09/27/2017 9  5 - 15 Final  . Prothrombin Time 09/27/2017 12.6  11.4 - 15.2 seconds Final  . INR 09/27/2017 0.95   Final  . ABO/RH(D) 09/27/2017 A POS   Final  . Antibody Screen 09/27/2017 NEG   Final  . Sample Expiration 09/27/2017 10/05/2017   Final  . Extend sample reason 09/27/2017 NO TRANSFUSIONS OR PREGNANCY IN THE PAST 3 MONTHS   Final  . MRSA, PCR 09/27/2017 NEGATIVE  NEGATIVE Final  . Staphylococcus aureus 09/27/2017 NEGATIVE  NEGATIVE Final   Comment: (NOTE) The Xpert SA Assay (FDA approved for NASAL specimens in patients 50 years of age and older), is one component of a comprehensive surveillance program. It is not intended to diagnose infection nor to guide or monitor treatment.      X-Rays:No results  found.  EKG: Orders placed or performed during the hospital encounter of 09/27/17  . EKG 12 lead  . EKG 12 lead     Hospital Course: [name] is a 84 y.o. who was admitted to [facility]. They were brought to the operating room on 10/02/2017 and underwent Procedure(s): LEFT TOTAL KNEE ARTHROPLASTY.  Patient tolerated the procedure well and was later transferred to the recovery room and then to the orthopaedic floor for postoperative care.  They were given PO and IV analgesics for pain control following their surgery.  They were given 24 hours of postoperative antibiotics of  Anti-infectives (From admission, onward)   Start     Dose/Rate Route Frequency Ordered Stop   10/02/17 1800  ceFAZolin (ANCEF) IVPB 2g/100 mL premix     2 g 200 mL/hr over 30 Minutes Intravenous Every 6 hours 10/02/17 1658 10/03/17 0030   10/02/17 0911  ceFAZolin (ANCEF) 2-4 GM/100ML-% IVPB    Comments:  [place] Session   : cabinet override      10/02/17 0911 10/02/17 2129   10/02/17 0910  ceFAZolin (ANCEF) IVPB 2g/100 mL premix     2 g 200 mL/hr over 30 Minutes Intravenous On call to O.R. 10/02/17 0910 10/02/17 1207     and started on DVT prophylaxis in the form of Xarelto.   PT and OT were ordered for total joint protocol.  Discharge planning consulted to help with postop disposition and equipment needs.  Patient had a decent night on the evening  of surgery.  They started to get up OOB with therapy on day one. Hemovac drain was pulled without difficulty.  Continued to work with therapy into day two.  Dressing was changed on day two and the incision was healing well.  Had increase in pain and did not meet all goals that day.  By day three, the patient had progressed with therapy and meeting their goals and had pain controlled.  Incision was healing well.  Patient was seen in rounds on POD 3 by Dr. Wynelle Link  and was ready to go home.   Discharge home - straight to outpatient on Monday Diet - Cardiac  diet Follow up - in 2 weeks Activity - WBAT Disposition - Home Condition Upon Discharge - Stable D/C Meds - See DC Summary DVT Prophylaxis - Xarelto     Discharge Instructions    Call MD / Call 911   Complete by:  As directed    If you experience chest pain or shortness of breath, CALL 911 and be transported to the hospital emergency room.  If you develope a fever above 101 F, pus (white drainage) or increased drainage or redness at the wound, or calf pain, call your surgeon's office.   Change dressing   Complete by:  As directed    Change dressing daily with sterile 4 x 4 inch gauze dressing and apply TED hose. Do not submerge the incision under water.   Constipation Prevention   Complete by:  As directed    Drink plenty of fluids.  Prune juice may be helpful.  You may use a stool softener, such as Colace (over the counter) 100 mg twice a day.  Use MiraLax (over the counter) for constipation as needed.   Diet - low sodium heart healthy   Complete by:  As directed    Discharge instructions   Complete by:  As directed    Take Xarelto for two and a half more weeks, then discontinue Xarelto. Once the patient has completed the Xarelto, they may resume the 81 mg Aspirin.  Pick up stool softner and laxative for home use following surgery while on pain medications. Do not submerge incision under water. Please use good hand washing techniques while changing dressing each day. May shower starting three days after surgery. Please use a clean towel to pat the incision dry following showers. Continue to use ice for pain and swelling after surgery. Do not use any lotions or creams on the incision until instructed by your surgeon.  Wear both TED hose on both legs during the day every day for three weeks, but may remove the TED hose at night at home.  Postoperative Constipation Protocol  Constipation - defined medically as fewer than three stools per week and severe constipation as less than  one stool per week.  One of the most common issues patients have following surgery is constipation.  Even if you have a regular bowel pattern at home, your normal regimen is likely to be disrupted due to multiple reasons following surgery.  Combination of anesthesia, postoperative narcotics, change in appetite and fluid intake all can affect your bowels.  In order to avoid complications following surgery, here are some recommendations in order to help you during your recovery period.  Colace (docusate) - Pick up an over-the-counter form of Colace or another stool softener and take twice a day as long as you are requiring postoperative pain medications.  Take with a full glass of water daily.  If you  experience loose stools or diarrhea, hold the colace until you stool forms back up.  If your symptoms do not get better within 1 week or if they get worse, check with your doctor.  Dulcolax (bisacodyl) - Pick up over-the-counter and take as directed by the product packaging as needed to assist with the movement of your bowels.  Take with a full glass of water.  Use this product as needed if not relieved by Colace only.   MiraLax (polyethylene glycol) - Pick up over-the-counter to have on hand.  MiraLax is a solution that will increase the amount of water in your bowels to assist with bowel movements.  Take as directed and can mix with a glass of water, juice, soda, coffee, or tea.  Take if you go more than two days without a movement. Do not use MiraLax more than once per day. Call your doctor if you are still constipated or irregular after using this medication for 7 days in a row.  If you continue to have problems with postoperative constipation, please contact the office for further assistance and recommendations.  If you experience "the worst abdominal pain ever" or develop nausea or vomiting, please contact the office immediatly for further recommendations for treatment.   Do not put a pillow under the  knee. Place it under the heel.   Complete by:  As directed    Do not sit on low chairs, stoools or toilet seats, as it may be difficult to get up from low surfaces   Complete by:  As directed    Driving restrictions   Complete by:  As directed    No driving until released by the physician.   Increase activity slowly as tolerated   Complete by:  As directed    Lifting restrictions   Complete by:  As directed    No lifting until released by the physician.   Patient may shower   Complete by:  As directed    You may shower without a dressing once there is no drainage.  Do not wash over the wound.  If drainage remains, do not shower until drainage stops.   TED hose   Complete by:  As directed    Use stockings (TED hose) for 3 weeks on both leg(s).  You may remove them at night for sleeping.   Weight bearing as tolerated   Complete by:  As directed    Laterality:  left   Extremity:  Lower     Allergies as of 10/05/2017      Reactions   Oxycodone Other (See Comments)   " my children said I went crazy ! "   Tape Other (See Comments)   Pulls skin off of legs. Tissue paper skin on legs      Medication List    STOP taking these medications   aspirin EC 81 MG tablet   FISH OIL + D3 PO   ibuprofen 200 MG tablet Commonly known as:  ADVIL,MOTRIN   MULTI-VITAMIN DAILY PO     TAKE these medications   bacitracin-polymyxin b ophthalmic ointment Commonly known as:  POLYSPORIN Place 1 application into the left eye 3 (three) times daily. apply to eye every 12 hours while awake   fluticasone 50 MCG/ACT nasal spray Commonly known as:  FLONASE Place 1 spray into both nostrils at bedtime as needed for allergies or rhinitis.   gatifloxacin 0.5 % Soln Commonly known as:  ZYMAXID Place 1 drop into the left eye 4 (  four) times daily.   iron polysaccharides 150 MG capsule Commonly known as:  NIFEREX Take 1 capsule (150 mg total) by mouth 2 (two) times daily.   methocarbamol 500 MG  tablet Commonly known as:  ROBAXIN Take 1 tablet (500 mg total) by mouth every 6 (six) hours as needed for muscle spasms.   prednisoLONE acetate 1 % ophthalmic suspension Commonly known as:  PRED FORTE Place 1 drop into the left eye 4 (four) times daily.   rivaroxaban 10 MG Tabs tablet Commonly known as:  XARELTO Take 1 tablet (10 mg total) by mouth daily with breakfast. Take Xarelto for two and a half more weeks following discharge from the hospital, then discontinue Xarelto. Once the patient has completed the Xarelto, they may resume the 81 mg Aspirin.   simvastatin 20 MG tablet Commonly known as:  ZOCOR Take 20 mg by mouth at bedtime.   SYSTANE BALANCE OP Place 1 drop into both eyes 4 (four) times daily.   traMADol 50 MG tablet Commonly known as:  ULTRAM Take 1-2 tablets (50-100 mg total) by mouth every 6 (six) hours as needed (mild pain).   triamterene-hydrochlorothiazide 37.5-25 MG tablet Commonly known as:  MAXZIDE-25 Take 1 tablet by mouth daily.            Discharge Care Instructions  (From admission, onward)        Start     Ordered   10/03/17 0000  Weight bearing as tolerated    Question Answer Comment  Laterality left   Extremity Lower      10/03/17 2200   10/03/17 0000  Change dressing    Comments:  Change dressing daily with sterile 4 x 4 inch gauze dressing and apply TED hose. Do not submerge the incision under water.   10/03/17 2200     Follow-up Information    Gaynelle Arabian, MD. Schedule an appointment as soon as possible for a visit on 10/17/2017.   Specialty:  Orthopedic Surgery Contact information: [address] [place] [address] [phone]           Signed: Arlee Muslim, PA-C Orthopaedic Surgery 10/05/2017, 8:45 AM

## 2017-10-03 NOTE — Evaluation (Signed)
Physical Therapy Evaluation Patient Details Name: [name] MRN: [id number] DOB: 1931-01-16 Today's Date: 10/03/2017   History of Present Illness  84 yo female s/p L TKA 10/02/17. Hx of R TKA and R THA  Clinical Impression  On eval, pt required Min assist for mobility. She was able to walk ~25 feet with a RW. Pain rated 6/10 with activity. Pt did c/o some mild lightheadedness during ambulation. Will follow and progress activity as tolerated.     Follow Up Recommendations DC plan and follow up therapy as arranged by surgeon    Equipment Recommendations  None recommended by PT    Recommendations for Other Services OT consult     Precautions / Restrictions Precautions Precautions: Knee;Fall Required Braces or Orthoses: Knee Immobilizer - Left Knee Immobilizer - Left: Discontinue once straight leg raise with < 10 degree lag Restrictions Weight Bearing Restrictions: No LLE Weight Bearing: Weight bearing as tolerated      Mobility  Bed Mobility               General bed mobility comments: oob with OT. sitting EOB at start of session.   Transfers Overall transfer level: Needs assistance Equipment used: Rolling walker (2 wheeled) Transfers: Sit to/from Stand Sit to Stand: Min assist;From elevated surface         General transfer comment: Assist to rise, stabilize, control descent. VCS safety, technique, hand/LE placement.   Ambulation/Gait Ambulation/Gait assistance: Min assist Ambulation Distance (Feet): 25 Feet Assistive device: Rolling walker (2 wheeled) Gait Pattern/deviations: Step-to pattern;Antalgic;Trunk flexed     General Gait Details: Assist to stabilize pt throughout distance. VCs safety, technique, sequence. Slow gait speed. Pt fatigues fairly easily.   Stairs            Wheelchair Mobility    Modified Rankin (Stroke Patients Only)       Balance                                             Pertinent Vitals/Pain  Pain Assessment: 0-10 Pain Score: 6  Pain Location: L knee with activity Pain Descriptors / Indicators: Aching;Sore Pain Intervention(s): Limited activity within patient's tolerance;Repositioned;Ice applied    Home Living Family/patient expects to be discharged to:: Private residence Living Arrangements: Alone Available Help at Discharge: Family;Available 24 hours/day Type of Home: House Home Access: Stairs to enter Entrance Stairs-Rails: Right Entrance Stairs-Number of Steps: 2 Home Layout: One level Home Equipment: Walker - 2 wheels;Grab bars - tub/shower;Bedside commode;Cane - single point      Prior Function Level of Independence: Independent         Comments: drives     Hand Dominance   Dominant Hand: Right    Extremity/Trunk Assessment   Upper Extremity Assessment Upper Extremity Assessment: Defer to OT evaluation    Lower Extremity Assessment Lower Extremity Assessment: Generalized weakness(s/p L TKA)    Cervical / Trunk Assessment Cervical / Trunk Assessment: Normal  Communication   Communication: No difficulties  Cognition Arousal/Alertness: Awake/alert Behavior During Therapy: WFL for tasks assessed/performed Overall Cognitive Status: Within Functional Limits for tasks assessed                                        General Comments      Exercises Total Joint  Exercises Ankle Circles/Pumps: AROM;Both;10 reps;Supine Quad Sets: AROM;Both;10 reps;Supine Heel Slides: AAROM;Left;10 reps;Supine Hip ABduction/ADduction: AAROM;Left;10 reps;Supine Straight Leg Raises: AAROM;Left;10 reps;Supine Goniometric ROM: ~10-55 degrees   Assessment/Plan    PT Assessment Patient needs continued PT services  PT Problem List Decreased strength;Decreased balance;Decreased mobility;Decreased range of motion;Decreased activity tolerance;Decreased knowledge of precautions;Decreased knowledge of use of DME;Pain       PT Treatment Interventions DME  instruction;Gait training;Functional mobility training;Therapeutic activities;Balance training;Patient/family education;Therapeutic exercise;Stair training    PT Goals (Current goals can be found in the Care Plan section)  Acute Rehab PT Goals Patient Stated Goal: less pain. home.  PT Goal Formulation: With patient Time For Goal Achievement: 10/17/17 Potential to Achieve Goals: Good    Frequency 7X/week   Barriers to discharge        Co-evaluation               AM-PAC PT "6 Clicks" Daily Activity  Outcome Measure Difficulty turning over in bed (including adjusting bedclothes, sheets and blankets)?: A Little Difficulty moving from lying on back to sitting on the side of the bed? : A Little Difficulty sitting down on and standing up from a chair with arms (e.g., wheelchair, bedside commode, etc,.)?: Unable Help needed moving to and from a bed to chair (including a wheelchair)?: A Little Help needed walking in hospital room?: A Little Help needed climbing 3-5 steps with a railing? : A Lot 6 Click Score: 15    End of Session Equipment Utilized During Treatment: Gait belt Activity Tolerance: Patient limited by fatigue;Patient limited by pain Patient left: in chair;with call bell/phone within reach;with family/visitor present   PT Visit Diagnosis: Muscle weakness (generalized) (M62.81);Difficulty in walking, not elsewhere classified (R26.2);Pain Pain - Right/Left: Left Pain - part of body: Leg    Time: 8889-1694 PT Time Calculation (min) (ACUTE ONLY): 24 min   Charges:   PT Evaluation $PT Eval Low Complexity: 1 Low PT Treatments $Gait Training: 8-22 mins   PT G Codes:          Weston Anna, MPT Pager: [phone]

## 2017-10-03 NOTE — Progress Notes (Signed)
Physical Therapy Treatment Patient Details Name: [name] MRN: [id number] DOB: [birth year] Today's Date: 10/03/2017    History of Present Illness 84 yo female s/p L TKA 10/02/17. Hx of R TKA and R THA    PT Comments    Progressing with mobility. Pain rated 6/10. Plan is for possible d/c home tomorrow. MD has arranged for OP PT.    Follow Up Recommendations  DC plan and follow up therapy as arranged by surgeon     Equipment Recommendations  None recommended by PT    Recommendations for Other Services OT consult     Precautions / Restrictions Precautions Precautions: Fall;Knee Required Braces or Orthoses: Knee Immobilizer - Left Knee Immobilizer - Left: Discontinue once straight leg raise with < 10 degree lag Restrictions Weight Bearing Restrictions: No LLE Weight Bearing: Weight bearing as tolerated    Mobility  Bed Mobility Overal bed mobility: Needs Assistance Bed Mobility: Sit to Supine       Sit to supine: Min assist   General bed mobility comments: assist for L LE.   Transfers Overall transfer level: Needs assistance Equipment used: Rolling walker (2 wheeled) Transfers: Sit to/from Stand Sit to Stand: Min assist         General transfer comment: Assist to rise, stabilize, control descent. VCS safety, technique, hand/LE placement. Increased time.   Ambulation/Gait Ambulation/Gait assistance: Min assist Ambulation Distance (Feet): 40 Feet Assistive device: Rolling walker (2 wheeled) Gait Pattern/deviations: Step-to pattern;Trunk flexed     General Gait Details: Assist to stabilize pt throughout distance. VCs safety, technique, sequence. Slow gait speed. Pt fatigues fairly easily.    Stairs            Wheelchair Mobility    Modified Rankin (Stroke Patients Only)       Balance                                            Cognition Arousal/Alertness: Awake/alert Behavior During Therapy: WFL for tasks  assessed/performed Overall Cognitive Status: Within Functional Limits for tasks assessed                                        Exercises      General Comments        Pertinent Vitals/Pain Pain Assessment: 0-10 Pain Score: 6  Pain Location: L knee with activity Pain Descriptors / Indicators: Aching;Sore Pain Intervention(s): Repositioned;Ice applied    Home Living                      Prior Function            PT Goals (current goals can now be found in the care plan section) Progress towards PT goals: Progressing toward goals    Frequency    7X/week      PT Plan Current plan remains appropriate    Co-evaluation              AM-PAC PT "6 Clicks" Daily Activity  Outcome Measure  Difficulty turning over in bed (including adjusting bedclothes, sheets and blankets)?: A Little Difficulty moving from lying on back to sitting on the side of the bed? : Unable Difficulty sitting down on and standing up from a chair with arms (e.g., wheelchair, bedside commode,  etc,.)?: Unable Help needed moving to and from a bed to chair (including a wheelchair)?: A Little Help needed walking in hospital room?: A Little Help needed climbing 3-5 steps with a railing? : A Lot 6 Click Score: 13    End of Session Equipment Utilized During Treatment: Gait belt Activity Tolerance: Patient limited by fatigue;Patient limited by pain Patient left: in bed;with call bell/phone within reach;with family/visitor present   PT Visit Diagnosis: Muscle weakness (generalized) (M62.81);Difficulty in walking, not elsewhere classified (R26.2);Pain Pain - Right/Left: Left Pain - part of body: Knee     Time: 6606-0045 PT Time Calculation (min) (ACUTE ONLY): 21 min  Charges:  $Gait Training: 8-22 mins                    G Codes:          Weston Anna, MPT Pager: [phone]

## 2017-10-03 NOTE — Progress Notes (Signed)
PT Cancellation Note  Patient Details Name: [name] MRN: [id number] DOB: 1930/11/17   Cancelled Treatment:    Reason Eval/Treat Not Completed: Attempted 2nd session. Pt requested PT check back later. Will check back as schedule permits.    Weston Anna, MPT Pager: [phone]

## 2017-10-04 LAB — BASIC METABOLIC PANEL
Anion gap: 7 (ref 5–15)
BUN: 20 mg/dL (ref 6–20)
CALCIUM: 8.8 mg/dL — AB (ref 8.9–10.3)
CO2: 26 mmol/L (ref 22–32)
Chloride: 100 mmol/L — ABNORMAL LOW (ref 101–111)
Creatinine, Ser: 0.86 mg/dL (ref 0.44–1.00)
GFR calc non Af Amer: >60 mL/min (ref >60)
Glucose, Bld: 110 mg/dL — ABNORMAL HIGH (ref 65–99)
Potassium: 4.1 mmol/L (ref 3.5–5.1)
Sodium: 133 mmol/L — ABNORMAL LOW (ref 135–145)

## 2017-10-04 LAB — CBC
HEMATOCRIT: 25.5 % — AB (ref 36.0–46.0)
Hemoglobin: 8.8 g/dL — ABNORMAL LOW (ref 12.0–15.0)
MCH: 31.4 pg (ref 26.0–34.0)
MCHC: 34.5 g/dL (ref 30.0–36.0)
MCV: 91.1 fL (ref 78.0–100.0)
Platelets: 317 10*3/uL (ref 150–400)
RBC: 2.8 MIL/uL — ABNORMAL LOW (ref 3.87–5.11)
RDW: 13.7 % (ref 11.5–15.5)
WBC: 17.6 10*3/uL — ABNORMAL HIGH (ref 4.0–10.5)

## 2017-10-04 MED ORDER — POLYSACCHARIDE IRON COMPLEX 150 MG PO CAPS: 150.0000 mg | ORAL_CAPSULE | Freq: Two times a day (BID) | ORAL | 0 refills | Status: DC

## 2017-10-04 MED ORDER — RIVAROXABAN 10 MG PO TABS: 10.0000 mg | ORAL_TABLET | Freq: Every day | ORAL | 0 refills | Status: DC

## 2017-10-04 MED ORDER — TRAMADOL HCL 50 MG PO TABS: 50.0000 mg | ORAL_TABLET | Freq: Four times a day (QID) | ORAL | 0 refills | Status: DC | PRN

## 2017-10-04 MED ORDER — METHOCARBAMOL 500 MG PO TABS: 500.0000 mg | ORAL_TABLET | Freq: Four times a day (QID) | ORAL | 0 refills | Status: DC | PRN

## 2017-10-04 MED ORDER — SODIUM CHLORIDE 0.9 % IV BOLUS (SEPSIS)
250.0000 mL | Freq: Once | INTRAVENOUS | Status: AC
  Administered 2017-10-04: 09:00:00 250 mL via INTRAVENOUS

## 2017-10-04 MED ORDER — POLYSACCHARIDE IRON COMPLEX 150 MG PO CAPS
150.0000 mg | ORAL_CAPSULE | Freq: Two times a day (BID) | ORAL | Status: DC
  Administered 2017-10-04 – 2017-10-06 (×5): 150 mg via ORAL
  Filled 2017-10-04 (×5): qty 1

## 2017-10-04 MED ORDER — HYDROMORPHONE HCL 2 MG PO TABS: 2.0000 mg | ORAL_TABLET | ORAL | 0 refills | Status: DC | PRN

## 2017-10-04 NOTE — Progress Notes (Signed)
   Subjective: 2 Days Post-Op Procedure(s) (LRB): LEFT TOTAL KNEE ARTHROPLASTY (Left) Patient reports pain as mild.   Patient seen in rounds with Dr. Wynelle Link. Patient is well, but has had some minor complaints of pain in the knee, requiring pain medications Patient is ready to go home later today if does well with therapy  Objective: Vital signs in last 24 hours: Temp:  [97.5 F (36.4 C)-98.1 F (36.7 C)] 98.1 F (36.7 C) (01/09 0614) Pulse Rate:  [72-84] 84 (01/09 0614) Resp:  [17-18] 17 (01/09 0614) BP: (106-129)/(57-83) 127/57 (01/09 0614) SpO2:  [98 %-100 %] 100 % (01/09 0614)  Intake/Output from previous day:  Intake/Output Summary (Last 24 hours) at 10/04/2017 0804 Last data filed at 10/04/2017 0630 Gross per 24 hour  Intake 2010 ml  Output 1275 ml  Net 735 ml    Intake/Output this shift: No intake/output data recorded.  Labs: Recent Labs    10/03/17 0556 10/04/17 0549  HGB 9.5* 8.8*   Recent Labs    10/03/17 0556 10/04/17 0549  WBC 16.3* 17.6*  RBC 3.07* 2.80*  HCT 27.8* 25.5*  PLT 318 317   Recent Labs    10/03/17 1224 10/04/17 0549  NA 133* 133*  K 3.4* 4.1  CL 99* 100*  CO2 25 26  BUN 18 20  CREATININE 0.92 0.86  GLUCOSE 188* 110*  CALCIUM 8.5* 8.8*   No results for input(s): LABPT, INR in the last 72 hours.  EXAM: General - Patient is Alert and Appropriate Extremity - Neurovascular intact Sensation intact distally Intact pulses distally Dorsiflexion/Plantar flexion intact Incision - clean, dry Motor Function - intact, moving foot and toes well on exam.   Assessment/Plan: 2 Days Post-Op Procedure(s) (LRB): LEFT TOTAL KNEE ARTHROPLASTY (Left) Procedure(s) (LRB): LEFT TOTAL KNEE ARTHROPLASTY (Left) Past Medical History:  Diagnosis Date  . Arthritis    "knees, hips, right foot, right hand" (09/01/2015)  . History of blood transfusion 2006   "w/knee OR  . Hyperlipidemia   . Hypertension   . Oral thrush   . Pneumonia    history of   . PONV (postoperative nausea and vomiting)    "just w/knee OR in 2006"  . 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 TIA (transient ischemic attack)    per ECHO 2008   Principal Problem:   OA (osteoarthritis) of knee  Estimated body mass index is 23.23 kg/m as calculated from the following:   Height as of this encounter: 5\' 2"  (1.575 m).   Weight as of this encounter: 57.6 kg (127 lb). Up with therapy Diet - Cardiac diet Follow up - in 2 weeks Activity - WBAT Disposition - Home Condition Upon Discharge - pending therapy D/C Meds - See DC Summary DVT Prophylaxis - Xarelto  Arlee Muslim, PA-C Orthopaedic Surgery 10/04/2017, 8:04 AM

## 2017-10-04 NOTE — Progress Notes (Signed)
Physical Therapy Treatment Patient Details Name: [name] MRN: [id number] DOB: June 10, 1931 Today's Date: 10/04/2017    History of Present Illness 84 yo female s/p L TKA 10/02/17. Hx of R TKA and R THA    PT Comments    Bed exercises only. Pt wanted to have pain meds prior to ambulating-made RN aware. Will continue to progress activity as able during next session(s).    Follow Up Recommendations  DC plan and follow up therapy as arranged by surgeon     Equipment Recommendations  None recommended by PT    Recommendations for Other Services       Precautions / Restrictions Precautions Precautions: Fall;Knee Required Braces or Orthoses: Knee Immobilizer - Left Knee Immobilizer - Left: Discontinue once straight leg raise with < 10 degree lag Restrictions Weight Bearing Restrictions: No LLE Weight Bearing: Weight bearing as tolerated    Mobility  Bed Mobility Overal bed mobility: Needs Assistance             Transfers       Ambulation/Gait         Stairs            Wheelchair Mobility    Modified Rankin (Stroke Patients Only)       Balance                                            Cognition Arousal/Alertness: Awake/alert Behavior During Therapy: WFL for tasks assessed/performed Overall Cognitive Status: Within Functional Limits for tasks assessed                                        Exercises Total Joint Exercises Ankle Circles/Pumps: AROM;Both;10 reps;Supine Quad Sets: AROM;Both;10 reps;Supine Heel Slides: AAROM;Left;10 reps;Supine Hip ABduction/ADduction: AAROM;Left;10 reps;Supine Straight Leg Raises: AAROM;Left;10 reps;Supine Goniometric ROM: ~10-55 degrees    General Comments        Pertinent Vitals/Pain Pain Assessment: 0-10 Pain Score: 6  Pain Location: L knee Pain Descriptors / Indicators: Aching;Sore Pain Intervention(s): Monitored during session;RN gave pain meds during  session    Home Living                      Prior Function            PT Goals (current goals can now be found in the care plan section) Progress towards PT goals: Progressing toward goals    Frequency    7X/week      PT Plan Current plan remains appropriate    Co-evaluation              AM-PAC PT "6 Clicks" Daily Activity  Outcome Measure  Difficulty turning over in bed (including adjusting bedclothes, sheets and blankets)?: A Little Difficulty moving from lying on back to sitting on the side of the bed? : Unable Difficulty sitting down on and standing up from a chair with arms (e.g., wheelchair, bedside commode, etc,.)?: Unable Help needed moving to and from a bed to chair (including a wheelchair)?: A Little Help needed walking in hospital room?: A Little Help needed climbing 3-5 steps with a railing? : A Lot 6 Click Score: 13    End of Session Equipment Utilized During Treatment: Gait belt Activity Tolerance: Patient limited by pain Patient left:  in bed;with call bell/phone within reach;with family/visitor present Nurse Communication: Patient requests pain meds PT Visit Diagnosis: Muscle weakness (generalized) (M62.81);Difficulty in walking, not elsewhere classified (R26.2);Pain Pain - Right/Left: Left Pain - part of body: Knee     Time: 5625-6389 PT Time Calculation (min) (ACUTE ONLY): 35 min  Charges:  $Gait Training: 8-22 mins $Therapeutic Exercise: 8-22 mins                    G Codes:           Weston Anna, MPT Pager: [phone]

## 2017-10-04 NOTE — Progress Notes (Signed)
Physical Therapy Treatment Patient Details Name: [name] MRN: [id number] DOB: November 01, 1930 Today's Date: 10/04/2017    History of Present Illness 84 yo female s/p L TKA 10/02/17. Hx of R TKA and R THA    PT Comments    Pt not progressing well on today. She was unable to successfully negotiate stairs due to fatigue, weakness, and pain. Noted pt to be very shaky prior to mobilizing. Made RN aware that pt is not safe to d/c home on today. Also feel pt may need HHPT instead of OP PT. Will continue to follow and progress activity as tolerated.    Follow Up Recommendations  Home health PT;Supervision/Assistance - 24 hour (pt will not be able to safely get in/out of home to get to OP PT. Recommend HHPT f/u instead)     Equipment Recommendations  None recommended by PT    Recommendations for Other Services       Precautions / Restrictions Precautions Precautions: Fall;Knee Required Braces or Orthoses: Knee Immobilizer - Left Knee Immobilizer - Left: Discontinue once straight leg raise with < 10 degree lag Restrictions Weight Bearing Restrictions: No LLE Weight Bearing: Weight bearing as tolerated    Mobility  Bed Mobility Overal bed mobility: Needs Assistance Bed Mobility: Supine to Sit     Supine to sit: Min assist;HOB elevated     General bed mobility comments: oob in recliner  Transfers Overall transfer level: Needs assistance Equipment used: Rolling walker (2 wheeled) Transfers: Sit to/from Stand Sit to Stand: Min assist         General transfer comment: Assist to rise, stabilize, control descent. VCS safety, technique, hand/LE placement. Increased time and effort.   Ambulation/Gait Ambulation/Gait assistance: Min assist Ambulation Distance (Feet): 10 Feet Assistive device: Rolling walker (2 wheeled) Gait Pattern/deviations: Step-to pattern;Trunk flexed;Antalgic     General Gait Details: Assist to stabilize pt throughout distance. VCs safety, technique,  sequence. Slow and effortful gait. Very fatigued.    Stairs Stairs: No       General stair comments: Attempted stair negotiation. Pt too fatigued and in too much pain to get up stairs. Will likely require RW backwards technique since she can't tolerate enough weight on L LE.   Wheelchair Mobility    Modified Rankin (Stroke Patients Only)       Balance                                            Cognition Arousal/Alertness: Awake/alert Behavior During Therapy: WFL for tasks assessed/performed Overall Cognitive Status: Within Functional Limits for tasks assessed                                        Exercises    General Comments        Pertinent Vitals/Pain Pain Assessment: 0-10 Pain Score: 8  Pain Location: L knee Pain Descriptors / Indicators: Aching;Sore;Crying Pain Intervention(s): Limited activity within patient's tolerance;Repositioned;Patient requesting pain meds-RN notified    Home Living                      Prior Function            PT Goals (current goals can now be found in the care plan section) Progress towards PT goals: Not progressing  toward goals - comment(poor performance/tolerance on today. Not safe to d/c-RN made aware. )    Frequency    7X/week      PT Plan Current plan remains appropriate    Co-evaluation              AM-PAC PT "6 Clicks" Daily Activity  Outcome Measure  Difficulty turning over in bed (including adjusting bedclothes, sheets and blankets)?: A Little Difficulty moving from lying on back to sitting on the side of the bed? : Unable Difficulty sitting down on and standing up from a chair with arms (e.g., wheelchair, bedside commode, etc,.)?: Unable Help needed moving to and from a bed to chair (including a wheelchair)?: A Little Help needed walking in hospital room?: A Little Help needed climbing 3-5 steps with a railing? : A Lot 6 Click Score: 13    End of  Session Equipment Utilized During Treatment: Gait belt;Left knee immobilizer Activity Tolerance: Patient limited by pain;Patient limited by fatigue Patient left: in chair;with call bell/phone within reach;with family/visitor present Nurse Communication: Patient requests pain meds PT Visit Diagnosis: Muscle weakness (generalized) (M62.81);Difficulty in walking, not elsewhere classified (R26.2);Pain Pain - Right/Left: Left Pain - part of body: Knee     Time: 1420-1434 PT Time Calculation (min) (ACUTE ONLY): 14 min  Charges:  $Gait Training: 8-22 mins $Therapeutic Exercise: 8-22 mins                    G Codes:          Weston Anna, MPT Pager: [phone]

## 2017-10-04 NOTE — Progress Notes (Signed)
Physical Therapy Treatment Patient Details Name: [name] MRN: [id number] DOB: 1931-01-09 Today's Date: 10/04/2017    History of Present Illness 84 yo female s/p L TKA 10/02/17. Hx of R TKA and R THA    PT Comments    Pt was only able to walk ~25 feet on today due to fatigue and general weakness. Moderate pain with activity. Will plan to have a 2nd session to practice stair negotiation. Plan is for possible d/c today.    Follow Up Recommendations  DC plan and follow up therapy as arranged by surgeon     Equipment Recommendations  None recommended by PT    Recommendations for Other Services       Precautions / Restrictions Precautions Precautions: Fall;Knee Required Braces or Orthoses: Knee Immobilizer - Left Knee Immobilizer - Left: Discontinue once straight leg raise with < 10 degree lag Restrictions Weight Bearing Restrictions: No LLE Weight Bearing: Weight bearing as tolerated    Mobility  Bed Mobility Overal bed mobility: Needs Assistance Bed Mobility: Supine to Sit     Supine to sit: Min assist;HOB elevated     General bed mobility comments: assist for L LE.   Transfers Overall transfer level: Needs assistance Equipment used: Rolling walker (2 wheeled) Transfers: Sit to/from Stand Sit to Stand: Min guard         General transfer comment: close guard for safety. VCs safety, technique, hand/LE placement. Increased time.   Ambulation/Gait Ambulation/Gait assistance: Min assist Ambulation Distance (Feet): 25 Feet Assistive device: Rolling walker (2 wheeled)       General Gait Details: Assist to stabilize pt throughout distance. VCs safety, technique, sequence. Slow gait speed. Pt fatigued easily on today. She required recliner to get back to room.    Stairs            Wheelchair Mobility    Modified Rankin (Stroke Patients Only)       Balance                                            Cognition  Arousal/Alertness: Awake/alert Behavior During Therapy: WFL for tasks assessed/performed Overall Cognitive Status: Within Functional Limits for tasks assessed                                        Exercises      General Comments        Pertinent Vitals/Pain Pain Assessment: 0-10 Pain Score: 6  Pain Location: L knee Pain Descriptors / Indicators: Aching;Sore Pain Intervention(s): Monitored during session;Repositioned    Home Living                      Prior Function            PT Goals (current goals can now be found in the care plan section) Progress towards PT goals: Progressing toward goals    Frequency    7X/week      PT Plan Current plan remains appropriate    Co-evaluation              AM-PAC PT "6 Clicks" Daily Activity  Outcome Measure  Difficulty turning over in bed (including adjusting bedclothes, sheets and blankets)?: A Little Difficulty moving from lying on back to sitting on the  side of the bed? : Unable Difficulty sitting down on and standing up from a chair with arms (e.g., wheelchair, bedside commode, etc,.)?: Unable Help needed moving to and from a bed to chair (including a wheelchair)?: A Little Help needed walking in hospital room?: A Little Help needed climbing 3-5 steps with a railing? : A Lot 6 Click Score: 13    End of Session Equipment Utilized During Treatment: Gait belt Activity Tolerance: Patient limited by fatigue Patient left: in chair;with family/visitor present   PT Visit Diagnosis: Muscle weakness (generalized) (M62.81);Difficulty in walking, not elsewhere classified (R26.2);Pain Pain - Right/Left: Left Pain - part of body: Knee     Time: 1200-1221 PT Time Calculation (min) (ACUTE ONLY): 21 min  Charges:  $Gait Training: 8-22 mins                    G Codes:          Weston Anna, MPT Pager: [phone]

## 2017-10-04 NOTE — Progress Notes (Signed)
Occupational Therapy Treatment Patient Details Name: [name] MRN: [id number] DOB: 03-17-1931 Today's Date: 10/04/2017    History of present illness 84 yo female s/p L TKA 10/02/17. Hx of R TKA and R THA   OT comments  Reviewed shower transfer and knee precautions/KI with daughter. Awaiting bolus prior to mobilizing due to c/o lightheadedness  Follow Up Recommendations  No OT follow up;Supervision - Intermittent    Equipment Recommendations  None recommended by OT    Recommendations for Other Services      Precautions / Restrictions Precautions Precautions: Fall;Knee Required Braces or Orthoses: Knee Immobilizer - Left Knee Immobilizer - Left: Discontinue once straight leg raise with < 10 degree lag Restrictions LLE Weight Bearing: Weight bearing as tolerated       Mobility Bed Mobility                  Transfers                      Balance                                           ADL either performed or assessed with clinical judgement   ADL                                         General ADL Comments: pt has felt a little dizzy. She is going to get a bolus.  Demonstrated shower transfer, but pt does not plan to take a shower right away. Daughter present and with many questions.  had her don KI with supervision, reviewed knee precautions and safety.  Daughter initially worried about doing something wrong.     Vision       Perception     Praxis      Cognition Arousal/Alertness: Awake/alert Behavior During Therapy: WFL for tasks assessed/performed Overall Cognitive Status: Within Functional Limits for tasks assessed                                          Exercises     Shoulder Instructions       General Comments      Pertinent Vitals/ Pain       Pain Score: 6  Pain Location: L knee Pain Descriptors / Indicators: Aching;Sore Pain Intervention(s): Limited activity  within patient's tolerance;Monitored during session;Premedicated before session;Repositioned;Ice applied  Home Living                                          Prior Functioning/Environment              Frequency           Progress Toward Goals  OT Goals(current goals can now be found in the care plan section)  Progress towards OT goals: Progressing toward goals     Plan      Co-evaluation                 AM-PAC PT "6 Clicks" Daily Activity     Outcome Measure   Help  from another person eating meals?: None Help from another person taking care of personal grooming?: A Little Help from another person toileting, which includes using toliet, bedpan, or urinal?: A Little Help from another person bathing (including washing, rinsing, drying)?: A Little Help from another person to put on and taking off regular upper body clothing?: A Little Help from another person to put on and taking off regular lower body clothing?: A Little 6 Click Score: 19    End of Session    Pain - Right/Left: Left Pain - part of body: Knee   Activity Tolerance Patient limited by pain   Patient Left in bed;with call bell/phone within reach   Nurse Communication          Time: 6808-8110 OT Time Calculation (min): 33 min  Charges: OT General Charges $OT Visit: 1 Visit OT Treatments $Self Care/Home Management : 8-22 mins $Therapeutic Activity: 8-22 mins  [name], OTR/L [phone] 10/04/2017   [name] 10/04/2017, 9:31 AM

## 2017-10-05 LAB — CBC
HCT: 24.9 % — ABNORMAL LOW (ref 36.0–46.0)
HEMOGLOBIN: 8.7 g/dL — AB (ref 12.0–15.0)
MCH: 31.9 pg (ref 26.0–34.0)
MCHC: 34.9 g/dL (ref 30.0–36.0)
MCV: 91.2 fL (ref 78.0–100.0)
Platelets: 304 10*3/uL (ref 150–400)
RBC: 2.73 MIL/uL — ABNORMAL LOW (ref 3.87–5.11)
RDW: 13.8 % (ref 11.5–15.5)
WBC: 17.8 10*3/uL — ABNORMAL HIGH (ref 4.0–10.5)

## 2017-10-05 MED ORDER — HYDROCODONE-ACETAMINOPHEN 5-325 MG PO TABS
1.0000 | ORAL_TABLET | ORAL | Status: DC | PRN
  Administered 2017-10-06 (×3): 1 via ORAL
  Filled 2017-10-05 (×3): qty 1

## 2017-10-05 MED ORDER — METHOCARBAMOL 500 MG PO TABS: 500.0000 mg | ORAL_TABLET | Freq: Four times a day (QID) | ORAL | 0 refills | Status: DC | PRN

## 2017-10-05 MED ORDER — POLYSACCHARIDE IRON COMPLEX 150 MG PO CAPS: 150.0000 mg | ORAL_CAPSULE | Freq: Two times a day (BID) | ORAL | 0 refills | Status: DC

## 2017-10-05 MED ORDER — RIVAROXABAN 10 MG PO TABS: 10.0000 mg | ORAL_TABLET | Freq: Every day | ORAL | 0 refills | Status: DC

## 2017-10-05 MED ORDER — TRAMADOL HCL 50 MG PO TABS: 50.0000 mg | ORAL_TABLET | Freq: Four times a day (QID) | ORAL | 0 refills | Status: DC | PRN

## 2017-10-05 NOTE — Care Management Important Message (Signed)
Important Message  Patient Details  Name: [name] MRN: [id number] Date of Birth: 12-Nov-1930   Medicare Important Message Given:  Yes    Kerin Salen 10/05/2017, 9:58 AMImportant Message  Patient Details  Name: [name] MRN: [id number] Date of Birth: [birth year]   Medicare Important Message Given:  Yes    Kerin Salen 10/05/2017, 9:58 AM

## 2017-10-05 NOTE — Progress Notes (Signed)
D Perkins notified that patient did not pass PT goals, Discharge cancelled. Bethann Punches RN

## 2017-10-05 NOTE — Progress Notes (Signed)
Physical Therapy Treatment Patient Details Name: [name] MRN: [id number] DOB: 01-07-1931 Today's Date: 10/05/2017    History of Present Illness 84 yo female s/p L TKA 10/02/17. Hx of R TKA and R THA    PT Comments    Pt is progressing slowly with mobility. She remains weak and fatigues very quickly with activity. She was barely able to practice stair negotiation on today with family present.  Pain rated 8/10 with activity. Pt c/o feeling dizzy and nauseous after activity. She does not feel she can discharge home safely on today. Will discuss with RN and have her pass this on to surgeon to see if he will approve her to stay another night for continued therapy. Will continue to progress activity as tolerated.    Follow Up Recommendations  Home health PT;Supervision/Assistance - 24 hour     Equipment Recommendations  None recommended by PT    Recommendations for Other Services       Precautions / Restrictions Precautions Precautions: Fall;Knee Required Braces or Orthoses: Knee Immobilizer - Left Knee Immobilizer - Left: Discontinue once straight leg raise with < 10 degree lag Restrictions Weight Bearing Restrictions: No LLE Weight Bearing: Weight bearing as tolerated    Mobility  Bed Mobility Overal bed mobility: Needs Assistance Bed Mobility: Supine to Sit;Sit to Supine     Supine to sit: Min assist;HOB elevated Sit to supine: Min assist;HOB elevated   General bed mobility comments: Assist for L LE. Increased time.   Transfers Overall transfer level: Needs assistance Equipment used: Rolling walker (2 wheeled) Transfers: Sit to/from Stand Sit to Stand: Min assist         General transfer comment: Close guard for safety. VCs safety, hand/LE placement. Increased time.   Ambulation/Gait Ambulation/Gait assistance: Min assist Ambulation Distance (Feet): 10 Feet Assistive device: Rolling walker (2 wheeled) Gait Pattern/deviations: Step-to pattern;Trunk  flexed;Antalgic     General Gait Details: Assist to stabilize pt throughout distance. VCs safety, technique, sequence. Slow and effortful gait. Pt fatigues quickly.    Stairs Stairs: Yes   Stair Management: Backwards;With walker Number of Stairs: 2 General stair comments: Assist to stabilize walker and stabilize pt. Daughter present to assist with stabilizing walker. Mod VCs safety, technique, sequence. Pt too fatigued to practice a 2nd time.   Wheelchair Mobility    Modified Rankin (Stroke Patients Only)       Balance                                            Cognition Arousal/Alertness: Awake/alert Behavior During Therapy: WFL for tasks assessed/performed Overall Cognitive Status: Within Functional Limits for tasks assessed                                        Exercises      General Comments        Pertinent Vitals/Pain Pain Assessment: 0-10 Pain Score: 8  Pain Location: L knee Pain Descriptors / Indicators: Aching;Sore Pain Intervention(s): Limited activity within patient's tolerance;Repositioned    Home Living                      Prior Function            PT Goals (current goals can now be found  in the care plan section) Progress towards PT goals: Progressing toward goals    Frequency    7X/week      PT Plan Current plan remains appropriate    Co-evaluation              AM-PAC PT "6 Clicks" Daily Activity  Outcome Measure  Difficulty turning over in bed (including adjusting bedclothes, sheets and blankets)?: A Little Difficulty moving from lying on back to sitting on the side of the bed? : Unable Difficulty sitting down on and standing up from a chair with arms (e.g., wheelchair, bedside commode, etc,.)?: Unable Help needed moving to and from a bed to chair (including a wheelchair)?: A Little Help needed walking in hospital room?: A Little Help needed climbing 3-5 steps with a railing? :  A Lot 6 Click Score: 13    End of Session Equipment Utilized During Treatment: Gait belt;Left knee immobilizer Activity Tolerance: Patient limited by fatigue;Patient limited by pain Patient left: in bed;with call bell/phone within reach;with family/visitor present   PT Visit Diagnosis: Muscle weakness (generalized) (M62.81);Difficulty in walking, not elsewhere classified (R26.2);Pain Pain - Right/Left: Left Pain - part of body: Knee     Time: 7517-0017 PT Time Calculation (min) (ACUTE ONLY): 33 min  Charges:  $Gait Training: 23-37 mins                    G Codes:          Weston Anna, MPT Pager: [phone]

## 2017-10-05 NOTE — Progress Notes (Signed)
Occupational Therapy Treatment Patient Details Name: [name] MRN: [id number] DOB: [birth year] Today's Date: 10/05/2017    History of present illness 84 yo female s/p L TKA 10/02/17. Hx of R TKA and R THA   OT comments  Pt fatiques very quickly; uses a lot of pressure through arms and feels worn out.  Seen with PT as RN reports it took +3 to get her off of commode.  Pt does not have the stamina to shower and should sponge bathe.  May need 3:1 brought to her as she fatiques so quickly.    Follow Up Recommendations  Supervision/Assistance - 24 hour; PT IS NOT PROGRESSING WELL: FATIQUES QUICKLY.  FAMILYSHOULD BE ABLE TO ASSIST WITH ADLS BUT PT MAY NEED 3:1 BROUGHT TO HER.   Equipment Recommendations  None recommended by OT    Recommendations for Other Services      Precautions / Restrictions Precautions Precautions: Fall;Knee Required Braces or Orthoses: Knee Immobilizer - Left Knee Immobilizer - Left: Discontinue once straight leg raise with < 10 degree lag Restrictions LLE Weight Bearing: Weight bearing as tolerated       Mobility Bed Mobility               General bed mobility comments: oob  Transfers   Equipment used: Rolling walker (2 wheeled)   Sit to Stand: Min assist         General transfer comment: cues for UE/LE placement    Balance                                           ADL either performed or assessed with clinical judgement   ADL Overall ADL's : Needs assistance/impaired                 Upper Body Dressing : Moderate assistance;Standing       Toilet Transfer: Minimal assistance;BSC;RW;Ambulation             General ADL Comments: pt had just gotten off commode with NT.  RN reports that earlier +3 needed to get her off commode.  Pt fatiques very easily.  Cues to bring LLE under her when standing.  Pt is using a lot of energy pushing through arms. Cued to try to put more weight on leg, but she wasn't able  to do this.       Vision       Perception     Praxis      Cognition Arousal/Alertness: Awake/alert Behavior During Therapy: WFL for tasks assessed/performed Overall Cognitive Status: Within Functional Limits for tasks assessed                                          Exercises     Shoulder Instructions       General Comments      Pertinent Vitals/ Pain       Pain Score: 5  Pain Location: L knee Pain Descriptors / Indicators: Aching Pain Intervention(s): Limited activity within patient's tolerance;Monitored during session;Premedicated before session;Repositioned  Home Living  Prior Functioning/Environment              Frequency           Progress Toward Goals  OT Goals(current goals can now be found in the care plan section)  Progress towards OT goals: Not progressing toward goals - comment     Plan      Co-evaluation    PT/OT/SLP Co-Evaluation/Treatment: Yes Reason for Co-Treatment: For patient/therapist safety PT goals addressed during session: Mobility/safety with mobility OT goals addressed during session: ADL's and self-care      AM-PAC PT "6 Clicks" Daily Activity     Outcome Measure   Help from another person eating meals?: None Help from another person taking care of personal grooming?: A Little Help from another person toileting, which includes using toliet, bedpan, or urinal?: A Little Help from another person bathing (including washing, rinsing, drying)?: A Lot Help from another person to put on and taking off regular upper body clothing?: A Lot Help from another person to put on and taking off regular lower body clothing?: A Lot 6 Click Score: 16    End of Session    OT Visit Diagnosis: Pain Pain - Right/Left: Left Pain - part of body: Knee   Activity Tolerance Patient limited by fatigue   Patient Left in chair;with call bell/phone within  reach;with family/visitor present   Nurse Communication          Time: 9563-8756 OT Time Calculation (min): 13 min  Charges: OT General Charges $OT Visit: 1 Visit OT Treatments $Therapeutic Activity: 8-22 mins  Lesle Chris, OTR/L [phone] 10/05/2017   [place] 10/05/2017, 11:22 AM

## 2017-10-05 NOTE — Progress Notes (Signed)
   Subjective: 3 Days Post-Op Procedure(s) (LRB): LEFT TOTAL KNEE ARTHROPLASTY (Left) Patient reports pain as mild.   Patient seen in rounds by Dr. Wynelle Link. She stayed another night. Did not meet all goals yesterday. Doing better today and plans for home Patient is well, but has had some minor complaints of pain in the knee, requiring pain medications Patient is ready to go home following therapy goals.  Objective: Vital signs in last 24 hours: Temp:  [98.9 F (37.2 C)-99.6 F (37.6 C)] 98.9 F (37.2 C) (01/10 0630) Pulse Rate:  [76-105] 105 (01/10 0630) Resp:  [16-19] 17 (01/10 0630) BP: (125-153)/(44-70) 129/49 (01/10 0630) SpO2:  [94 %-96 %] 94 % (01/10 0630)  Intake/Output from previous day:  Intake/Output Summary (Last 24 hours) at 10/05/2017 0840 Last data filed at 10/05/2017 0630 Gross per 24 hour  Intake 720 ml  Output -  Net 720 ml    Intake/Output this shift: No intake/output data recorded.  Labs: Recent Labs    10/03/17 0556 10/04/17 0549 10/05/17 0627  HGB 9.5* 8.8* 8.7*   Recent Labs    10/04/17 0549 10/05/17 0627  WBC 17.6* 17.8*  RBC 2.80* 2.73*  HCT 25.5* 24.9*  PLT 317 304   Recent Labs    10/03/17 1224 10/04/17 0549  NA 133* 133*  K 3.4* 4.1  CL 99* 100*  CO2 25 26  BUN 18 20  CREATININE 0.92 0.86  GLUCOSE 188* 110*  CALCIUM 8.5* 8.8*   No results for input(s): LABPT, INR in the last 72 hours.  EXAM: General - Patient is Alert, Appropriate and Oriented Extremity - Neurovascular intact Sensation intact distally Intact pulses distally Dorsiflexion/Plantar flexion intact Incision - clean, dry Motor Function - intact, moving foot and toes well on exam.   Assessment/Plan: 3 Days Post-Op Procedure(s) (LRB): LEFT TOTAL KNEE ARTHROPLASTY (Left) Procedure(s) (LRB): LEFT TOTAL KNEE ARTHROPLASTY (Left) Past Medical History:  Diagnosis Date  . Arthritis    "knees, hips, right foot, right hand" (09/01/2015)  . History of blood  transfusion 2006   "w/knee OR  . Hyperlipidemia   . Hypertension   . Oral thrush   . Pneumonia    history of  . PONV (postoperative nausea and vomiting)    "just w/knee OR in 2006"  . 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 TIA (transient ischemic attack)    per ECHO 2008   Principal Problem:   OA (osteoarthritis) of knee  Estimated body mass index is 23.23 kg/m as calculated from the following:   Height as of this encounter: 5\' 2"  (1.575 m).   Weight as of this encounter: 57.6 kg (127 lb). Up with therapy Discharge home - straight to outpatient on Monday Diet - Cardiac diet Follow up - in 2 weeks Activity - WBAT Disposition - Home Condition Upon Discharge - Stable D/C Meds - See DC Summary DVT Prophylaxis - Xarelto  Arlee Muslim, PA-C Orthopaedic Surgery 10/05/2017, 8:40 AM

## 2017-10-05 NOTE — Progress Notes (Signed)
Physical Therapy Treatment Patient Details Name: [name] MRN: [id number] DOB: [birth year] Today's Date: 10/05/2017    History of Present Illness 84 yo female s/p L TKA 10/02/17. Hx of R TKA and R THA    PT Comments    Pt continues to demonstrate poor activity tolerance. She was only able to walk ~10 feet this am. Limited by pain and fatigue. Will continue to follow and progress activity as tolerated.    Follow Up Recommendations  Home health PT;Supervision/Assistance - 24 hour     Equipment Recommendations  None recommended by PT    Recommendations for Other Services       Precautions / Restrictions Precautions Precautions: Fall;Knee Required Braces or Orthoses: Knee Immobilizer - Left Knee Immobilizer - Left: Discontinue once straight leg raise with < 10 degree lag Restrictions Weight Bearing Restrictions: No LLE Weight Bearing: Weight bearing as tolerated    Mobility  Bed Mobility Overal bed mobility: Needs Assistance Bed Mobility: Supine to Sit     Supine to sit: Min assist;HOB elevated Sit to supine: Min assist;HOB elevated   General bed mobility comments: Assist for L LE. Increased time.   Transfers Overall transfer level: Needs assistance Equipment used: Rolling walker (2 wheeled) Transfers: Sit to/from Stand Sit to Stand: Min assist         General transfer comment: Assist to rise, stabilize, control descent. VCs safety, technique, hand/LE placement.   Ambulation/Gait Ambulation/Gait assistance: Min assist Ambulation Distance (Feet): 10 Feet Assistive device: Rolling walker (2 wheeled) Gait Pattern/deviations: Step-to pattern;Trunk flexed;Antalgic     General Gait Details: Assist to stabilize pt throughout distance. VCs safety, technique, sequence. Slow and effortful gait. Pt fatigues quickly. Pt unable to continue-required recliner to sit and for transport back to room.    Stairs Stairs: Yes  Min Assist +2 safety/equipment Stair  Management: Backwards;With walker Number of Stairs: 2 General stair comments: Assist to stabilize walker and stabilize pt. Daughter present to assist with stabilizing walker. Mod VCs safety, technique, sequence. Pt too fatigued to practice a 2nd time.   Wheelchair Mobility    Modified Rankin (Stroke Patients Only)       Balance                                            Cognition Arousal/Alertness: Awake/alert Behavior During Therapy: WFL for tasks assessed/performed Overall Cognitive Status: Within Functional Limits for tasks assessed                                        Exercises Total Joint Exercises Ankle Circles/Pumps: AROM;Both;10 reps;Supine Quad Sets: AROM;Both;10 reps;Supine Heel Slides: AAROM;Left;10 reps;Supine Hip ABduction/ADduction: AAROM;Left;10 reps;Supine Straight Leg Raises: AAROM;Left;10 reps;Supine Goniometric ROM: ~10-55 degrees    General Comments        Pertinent Vitals/Pain Pain Assessment: 0-10 Pain Score: 8  Pain Location: L knee Pain Descriptors / Indicators: Aching;Sore Pain Intervention(s): Limited activity within patient's tolerance;Repositioned;Ice applied    Home Living                      Prior Function            PT Goals (current goals can now be found in the care plan section) Progress towards PT goals: Progressing toward goals  Frequency    7X/week      PT Plan Current plan remains appropriate    Co-evaluation              AM-PAC PT "6 Clicks" Daily Activity  Outcome Measure  Difficulty turning over in bed (including adjusting bedclothes, sheets and blankets)?: A Little Difficulty moving from lying on back to sitting on the side of the bed? : Unable Difficulty sitting down on and standing up from a chair with arms (e.g., wheelchair, bedside commode, etc,.)?: Unable Help needed moving to and from a bed to chair (including a wheelchair)?: A Little Help needed  walking in hospital room?: A Little Help needed climbing 3-5 steps with a railing? : A Lot 6 Click Score: 13    End of Session Equipment Utilized During Treatment: Gait belt;Left knee immobilizer Activity Tolerance: Patient limited by fatigue;Patient limited by pain Patient left: in bed;with call bell/phone within reach;with family/visitor present   PT Visit Diagnosis: Muscle weakness (generalized) (M62.81);Difficulty in walking, not elsewhere classified (R26.2);Pain Pain - Right/Left: Left Pain - part of body: Knee     Time: 8937-3428 PT Time Calculation (min) (ACUTE ONLY): 26 min  Charges:  $Gait Training: 23-37 mins $Therapeutic Exercise: 8-22 mins                    G Codes:          Weston Anna, MPT Pager: [phone]

## 2017-10-06 LAB — CBC
HEMATOCRIT: 24.8 % — AB (ref 36.0–46.0)
HEMOGLOBIN: 8.5 g/dL — AB (ref 12.0–15.0)
MCH: 31 pg (ref 26.0–34.0)
MCHC: 34.3 g/dL (ref 30.0–36.0)
MCV: 90.5 fL (ref 78.0–100.0)
Platelets: 321 10*3/uL (ref 150–400)
RBC: 2.74 MIL/uL — ABNORMAL LOW (ref 3.87–5.11)
RDW: 13.6 % (ref 11.5–15.5)
WBC: 13.3 10*3/uL — ABNORMAL HIGH (ref 4.0–10.5)

## 2017-10-06 LAB — PREPARE RBC (CROSSMATCH)

## 2017-10-06 MED ORDER — ACETAMINOPHEN 325 MG PO TABS
650.0000 mg | ORAL_TABLET | Freq: Once | ORAL | Status: AC
  Administered 2017-10-06: 11:00:00 650 mg via ORAL
  Filled 2017-10-06: qty 2

## 2017-10-06 MED ORDER — SODIUM CHLORIDE 0.9 % IV SOLN: Freq: Once | INTRAVENOUS | Status: DC

## 2017-10-06 NOTE — Progress Notes (Signed)
   10/06/17 1600  OT Visit Information  Last OT Received On 10/06/17  Assistance Needed +1  History of Present Illness 84 yo female s/p L TKA 10/02/17. Hx of R TKA and R THA  Precautions  Precautions Fall;Knee  Required Braces or Orthoses Knee Immobilizer - Left  Knee Immobilizer - Left Discontinue once straight leg raise with < 10 degree lag  Pain Assessment  Pain Score 7  Pain Location L knee  Pain Descriptors / Indicators Aching;Sore  Pain Intervention(s) Limited activity within patient's tolerance;Monitored during session;Premedicated before session;Repositioned;Ice applied  Cognition  Arousal/Alertness Awake/alert  Behavior During Therapy WFL for tasks assessed/performed  Overall Cognitive Status Within Functional Limits for tasks assessed  ADL  Lower Body Dressing Total assistance;Sit to/from Retail [employer] Minimal assistance;BSC;RW;Stand-pivot  Toileting- Clothing Manipulation and Hygiene Total assistance;Sit to/from stand  General ADL Comments pt's daughter and son in law present.  Demonstrated assisting pt with clothing, standing without KI and guarding her while assisting to pull pants up then sitting to don brace.  Daughter plans to assist pt dress from recliner and it will be easier to don KI from that position. She has already donned KI from bed level.  Demonstrated guarding, standing on L to assist with LLE extension prior to sitting.  Daugher verbalizes comfort with assisting pt. She said she may just have pt wear gowns. This would be fine; also explained once pants are on, KI can be placed right over them without difficulty and clothing can be managed with KI on.    Restrictions  LLE Weight Bearing WBAT  Transfers  Sit to Stand Min assist  Stand pivot transfers Min assist  General Comments  General comments (skin integrity, edema, etc.) pt c/o dizziness once when sitting on commode.  BP was 141/58.    OT - End of Session  Activity Tolerance Patient limited by  fatigue  Patient left in chair;with call bell/phone within reach;with family/visitor present  OT Assessment/Plan  OT Visit Diagnosis Pain  Pain - Right/Left Left  Pain - part of body Knee  Follow Up Recommendations Supervision/Assistance - 24 hour  OT Equipment None recommended by OT  AM-PAC OT "6 Clicks" Daily Activity Outcome Measure  Help from another person eating meals? 4  Help from another person taking care of personal grooming? 3  Help from another person toileting, which includes using toliet, bedpan, or urinal? 2  Help from another person bathing (including washing, rinsing, drying)? 2  Help from another person to put on and taking off regular upper body clothing? 2  Help from another person to put on and taking off regular lower body clothing? 1  6 Click Score 14  ADL G Code Conversion CK  OT Goal Progression  Progress towards OT goals Progressing toward goals  OT Time Calculation  OT Start Time (ACUTE ONLY) 1534  OT Stop Time (ACUTE ONLY) 1559  OT Time Calculation (min) 25 min  OT General Charges  $OT Visit 1 Visit  OT Treatments  $Self Care/Home Management  23-37 mins  Lesle Chris, OTR/L [phone] 10/06/2017

## 2017-10-06 NOTE — Progress Notes (Signed)
Physical Therapy Treatment Patient Details Name: [name] MRN: [id number] DOB: 23-Apr-1931 Today's Date: 10/06/2017    History of Present Illness 84 yo female s/p L TKA 10/02/17. Hx of R TKA and R THA    PT Comments    Improved performance/tolerance on today. She c/o mild lightheadedness during session. Pt was able to walk a short distance and negotiate stairs. Caregivers present to assist and observe during session. Feel pt should be able to d/c home today at current level. Issued HEP for pt to perform 2x/day until she begins OP PT. All education completed.    Follow Up Recommendations  Home health PT;Supervision/Assistance - 24 hour     Equipment Recommendations  None recommended by PT    Recommendations for Other Services OT consult     Precautions / Restrictions Precautions Precautions: Fall;Knee Required Braces or Orthoses: Knee Immobilizer - Left Knee Immobilizer - Left: Discontinue once straight leg raise with < 10 degree lag Restrictions Weight Bearing Restrictions: No LLE Weight Bearing: Weight bearing as tolerated    Mobility  Bed Mobility Overal bed mobility: Needs Assistance Bed Mobility: Supine to Sit     Supine to sit: Min assist;HOB elevated     General bed mobility comments: Assist for L LE. Increased time. Daughter practiced with therapist supervision.   Transfers Overall transfer level: Needs assistance Equipment used: Rolling walker (2 wheeled) Transfers: Sit to/from [employer] Sit to Stand: Min assist Stand pivot transfers: Min assist       General transfer comment: Assist to rise, stabilize, control descent. VCs safety, technique, hand/LE placement. Stand pivot, recliner to bsc, with RW. Daughter practiced with therapist supervision. Stand pivot, bed to recliner, with RW.   Ambulation/Gait Ambulation/Gait assistance: Min guard Ambulation Distance (Feet): 10 Feet Assistive device: Rolling walker (2 wheeled) Gait  Pattern/deviations: Step-to pattern;Antalgic     General Gait Details: VCs safety, technique, sequence. Distance limited by need to toilet. Pt c/o mild lightheadedness ("not as bad as before").    Stairs Stairs: Yes Min assist +2 safety/equipment Stair Management: Backwards;With walker Number of Stairs: 2 General stair comments: Assist to stabilize walker and stabilize pt. Daughter present to assist with stabilizing walker. Son present to observe as well. Recommended 2 person assist for safety.   Wheelchair Mobility    Modified Rankin (Stroke Patients Only)       Balance                                            Cognition Arousal/Alertness: Awake/alert Behavior During Therapy: WFL for tasks assessed/performed Overall Cognitive Status: Within Functional Limits for tasks assessed                                        Exercises      General Comments        Pertinent Vitals/Pain Pain Assessment: 0-10 Pain Score: 7  Pain Location: L knee Pain Descriptors / Indicators: Aching;Sore Pain Intervention(s): Monitored during session;Repositioned;Ice applied    Home Living                      Prior Function            PT Goals (current goals can now be found in the care plan section)  Progress towards PT goals: Progressing toward goals    Frequency    7X/week      PT Plan Current plan remains appropriate    Co-evaluation              AM-PAC PT "6 Clicks" Daily Activity  Outcome Measure  Difficulty turning over in bed (including adjusting bedclothes, sheets and blankets)?: A Little Difficulty moving from lying on back to sitting on the side of the bed? : Unable Difficulty sitting down on and standing up from a chair with arms (e.g., wheelchair, bedside commode, etc,.)?: Unable Help needed moving to and from a bed to chair (including a wheelchair)?: A Little Help needed walking in hospital room?: A  Little Help needed climbing 3-5 steps with a railing? : A Lot 6 Click Score: 13    End of Session Equipment Utilized During Treatment: Gait belt;Left knee immobilizer Activity Tolerance: Patient tolerated treatment well Patient left: (on bsc with OT taking over session)   PT Visit Diagnosis: Muscle weakness (generalized) (M62.81);Difficulty in walking, not elsewhere classified (R26.2);Pain Pain - Right/Left: Left Pain - part of body: Knee     Time: 9702-6378 PT Time Calculation (min) (ACUTE ONLY): 28 min  Charges:  $Gait Training: 8-22 mins $Therapeutic Exercise: 8-22 mins                    G Codes:          Weston Anna, MPT Pager: [phone]

## 2017-10-06 NOTE — Progress Notes (Signed)
Physical Therapy Treatment Patient Details Name: [name] MRN: [id number] DOB: [birth year] Today's Date: 10/06/2017    History of Present Illness 84 yo female s/p L TKA 10/02/17. Hx of R TKA and R THA    PT Comments    Pt did not tolerate activity well on today. She remains weak and fatigues very easily. She c/o dizziness and nausea which limited ambulation distance as well. Family present to practice as much as they could. Pt is set to have a blood transfusion today. Will plan to have a 2nd session after blood transfusion complete.     Follow Up Recommendations  Home health PT;Supervision/Assistance - 24 hour     Equipment Recommendations  None recommended by PT    Recommendations for Other Services       Precautions / Restrictions Precautions Precautions: Fall;Knee Required Braces or Orthoses: Knee Immobilizer - Left Knee Immobilizer - Left: Discontinue once straight leg raise with < 10 degree lag Restrictions Weight Bearing Restrictions: No LLE Weight Bearing: Weight bearing as tolerated    Mobility  Bed Mobility Overal bed mobility: Needs Assistance Bed Mobility: Supine to Sit     Supine to sit: Min assist;HOB elevated     General bed mobility comments: Assist for L LE. Increased time. Daughter practiced with therapist supervision.   Transfers Overall transfer level: Needs assistance Equipment used: Rolling walker (2 wheeled) Transfers: Sit to/from [facility] Sit to Stand: Min assist         General transfer comment: Assist to rise, stabilize, control descent. VCs safety, technique, hand/LE placement. Daughter practiced with therapist supervision. Stand pivot, bed to recliner, with RW.   Ambulation/Gait Ambulation/Gait assistance: Min assist Ambulation Distance (Feet): 5 Feet Assistive device: Rolling walker (2 wheeled) Gait Pattern/deviations: Step-to pattern;Trunk flexed;Antalgic     General Gait Details: Assist to stabilize  pt throughout distance. VCs safety, technique, sequence. Slow and effortful gait. Pt fatigued very quickly. Unable to make it pass the door frame. Pt c/o dizziness and nausea.    Stairs            Wheelchair Mobility    Modified Rankin (Stroke Patients Only)       Balance                                            Cognition Arousal/Alertness: Awake/alert Behavior During Therapy: WFL for tasks assessed/performed Overall Cognitive Status: Within Functional Limits for tasks assessed                                        Exercises Total Joint Exercises Ankle Circles/Pumps: AROM;Both;10 reps;Supine Quad Sets: AROM;Both;10 reps;Supine Heel Slides: AAROM;Left;10 reps;Supine Hip ABduction/ADduction: AAROM;Left;10 reps;Supine Straight Leg Raises: AAROM;Left;10 reps;Supine Goniometric ROM: ~10-55 degrees    General Comments        Pertinent Vitals/Pain Pain Assessment: 0-10 Pain Score: 7  Pain Location: L knee Pain Descriptors / Indicators: Aching;Sore Pain Intervention(s): Limited activity within patient's tolerance;Repositioned    Home Living                      Prior Function            PT Goals (current goals can now be found in the care plan section) Progress towards PT goals:  Not progressing toward goals - comment(remains limited by pain, fatigue, weakness, nausea)    Frequency    7X/week      PT Plan Current plan remains appropriate    Co-evaluation              AM-PAC PT "6 Clicks" Daily Activity  Outcome Measure  Difficulty turning over in bed (including adjusting bedclothes, sheets and blankets)?: A Little Difficulty moving from lying on back to sitting on the side of the bed? : Unable Difficulty sitting down on and standing up from a chair with arms (e.g., wheelchair, bedside commode, etc,.)?: Unable Help needed moving to and from a bed to chair (including a wheelchair)?: A Little Help  needed walking in hospital room?: A Little Help needed climbing 3-5 steps with a railing? : A Lot 6 Click Score: 13    End of Session Equipment Utilized During Treatment: Gait belt;Left knee immobilizer Activity Tolerance: Patient limited by fatigue;Patient limited by pain Patient left: in chair;with call bell/phone within reach;with family/visitor present   PT Visit Diagnosis: Muscle weakness (generalized) (M62.81);Difficulty in walking, not elsewhere classified (R26.2);Pain Pain - Right/Left: Left Pain - part of body: Knee     Time: 0940-1010 PT Time Calculation (min) (ACUTE ONLY): 30 min  Charges:  $Gait Training: 8-22 mins $Therapeutic Exercise: 8-22 mins                    G Codes:          Weston Anna, MPT Pager: [phone]

## 2017-10-06 NOTE — Progress Notes (Signed)
   Subjective: 4 Days Post-Op Procedure(s) (LRB): LEFT TOTAL KNEE ARTHROPLASTY (Left) Patient reports pain as moderate pain last night. Patient seen in rounds with Dr. Wynelle Link.  She has progressed slowly and experienced lightheadedness yesterday with therapy.  HGB stable the past two days but low.  Will give a unit of blood this morning. Plan home later this afternoon after blood and therapy. Patient is setup to go home later this afternoon.  Objective: Vital signs in last 24 hours: Temp:  [99.2 F (37.3 C)-99.3 F (37.4 C)] 99.3 F (37.4 C) (01/11 0552) Pulse Rate:  [82-84] 83 (01/11 0552) Resp:  [15-16] 15 (01/11 0552) BP: (131-145)/(50) 145/50 (01/11 0552) SpO2:  [95 %-96 %] 96 % (01/11 0552)  Intake/Output from previous day:  Intake/Output Summary (Last 24 hours) at 10/06/2017 0734 Last data filed at 10/06/2017 0553 Gross per 24 hour  Intake 790 ml  Output 700 ml  Net 90 ml    Intake/Output this shift: No intake/output data recorded.  Labs: Recent Labs    10/04/17 0549 10/05/17 0627 10/06/17 0530  HGB 8.8* 8.7* 8.5*   Recent Labs    10/05/17 0627 10/06/17 0530  WBC 17.8* 13.3*  RBC 2.73* 2.74*  HCT 24.9* 24.8*  PLT 304 321   Recent Labs    10/03/17 1224 10/04/17 0549  NA 133* 133*  K 3.4* 4.1  CL 99* 100*  CO2 25 26  BUN 18 20  CREATININE 0.92 0.86  GLUCOSE 188* 110*  CALCIUM 8.5* 8.8*   No results for input(s): LABPT, INR in the last 72 hours.  EXAM: General - Patient is Alert, Appropriate and Oriented Extremity - Neurovascular intact Sensation intact distally Intact pulses distally Dorsiflexion/Plantar flexion intact Incision - clean, dry, no drainage Motor Function - intact, moving foot and toes well on exam.   Assessment/Plan: 4 Days Post-Op Procedure(s) (LRB): LEFT TOTAL KNEE ARTHROPLASTY (Left) Procedure(s) (LRB): LEFT TOTAL KNEE ARTHROPLASTY (Left) Past Medical History:  Diagnosis Date  . Arthritis    "knees, hips, right foot,  right hand" (09/01/2015)  . History of blood transfusion 2006   "w/knee OR  . Hyperlipidemia   . Hypertension   . Oral thrush   . Pneumonia    history of  . PONV (postoperative nausea and vomiting)    "just w/knee OR in 2006"  . 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 TIA (transient ischemic attack)    per ECHO 2008   Principal Problem:   OA (osteoarthritis) of knee  Estimated body mass index is 23.23 kg/m as calculated from the following:   Height as of this encounter: 5\' 2"  (1.575 m).   Weight as of this encounter: 57.6 kg (127 lb).  Blood this morning Up with therapy Discharge home - straight to outpatient on Monday Diet - Cardiac diet Follow up - in 2 weeks Activity - WBAT Disposition - Home Condition Upon Discharge - Stable D/C Meds - See DC Summary DVT Prophylaxis - Xarelto  Arlee Muslim, PA-C Orthopaedic Surgery 10/06/2017, 7:34 AM

## 2017-10-07 LAB — TYPE AND SCREEN
ABO/RH(D): A POS
Antibody Screen: NEGATIVE
UNIT DIVISION: 0

## 2017-10-07 LAB — BPAM RBC
BLOOD PRODUCT EXPIRATION DATE: 201901312359
ISSUE DATE / TIME: 201901111032
Unit Type and Rh: 6200

## 2017-10-10 DIAGNOSIS — Z471 Aftercare following joint replacement surgery: Secondary | ICD-10-CM | POA: Diagnosis not present

## 2017-10-10 DIAGNOSIS — M1612 Unilateral primary osteoarthritis, left hip: Secondary | ICD-10-CM | POA: Diagnosis not present

## 2017-10-10 DIAGNOSIS — H547 Unspecified visual loss: Secondary | ICD-10-CM | POA: Diagnosis not present

## 2017-10-10 DIAGNOSIS — M4125 Other idiopathic scoliosis, thoracolumbar region: Secondary | ICD-10-CM | POA: Diagnosis not present

## 2017-10-10 DIAGNOSIS — I10 Essential (primary) hypertension: Secondary | ICD-10-CM | POA: Diagnosis not present

## 2017-10-10 DIAGNOSIS — M5136 Other intervertebral disc degeneration, lumbar region: Secondary | ICD-10-CM | POA: Diagnosis not present

## 2017-10-11 ENCOUNTER — Emergency Department (HOSPITAL_COMMUNITY)
Admission: EM | Admit: 2017-10-11 | Discharge: 2017-10-12 | Disposition: A | Discharge status: Home / Self Care | Payer: Medicare Other | Attending: Emergency Medicine | Admitting: Emergency Medicine

## 2017-10-11 ENCOUNTER — Emergency Department (HOSPITAL_COMMUNITY): Payer: Medicare Other

## 2017-10-11 DIAGNOSIS — Z471 Aftercare following joint replacement surgery: Secondary | ICD-10-CM | POA: Diagnosis not present

## 2017-10-11 DIAGNOSIS — E785 Hyperlipidemia, unspecified: Secondary | ICD-10-CM | POA: Insufficient documentation

## 2017-10-11 DIAGNOSIS — E876 Hypokalemia: Secondary | ICD-10-CM | POA: Insufficient documentation

## 2017-10-11 DIAGNOSIS — H547 Unspecified visual loss: Secondary | ICD-10-CM | POA: Diagnosis not present

## 2017-10-11 DIAGNOSIS — M4125 Other idiopathic scoliosis, thoracolumbar region: Secondary | ICD-10-CM | POA: Diagnosis not present

## 2017-10-11 DIAGNOSIS — M5136 Other intervertebral disc degeneration, lumbar region: Secondary | ICD-10-CM | POA: Diagnosis not present

## 2017-10-11 DIAGNOSIS — K5903 Drug induced constipation: Secondary | ICD-10-CM | POA: Diagnosis not present

## 2017-10-11 DIAGNOSIS — K5730 Diverticulosis of large intestine without perforation or abscess without bleeding: Secondary | ICD-10-CM | POA: Diagnosis not present

## 2017-10-11 DIAGNOSIS — K2970 Gastritis, unspecified, without bleeding: Secondary | ICD-10-CM | POA: Diagnosis not present

## 2017-10-11 DIAGNOSIS — I10 Essential (primary) hypertension: Secondary | ICD-10-CM | POA: Diagnosis not present

## 2017-10-11 DIAGNOSIS — T402X5A Adverse effect of other opioids, initial encounter: Secondary | ICD-10-CM

## 2017-10-11 DIAGNOSIS — E871 Hypo-osmolality and hyponatremia: Secondary | ICD-10-CM | POA: Insufficient documentation

## 2017-10-11 DIAGNOSIS — Z8673 Personal history of transient ischemic attack (TIA), and cerebral infarction without residual deficits: Secondary | ICD-10-CM | POA: Diagnosis not present

## 2017-10-11 DIAGNOSIS — Z79899 Other long term (current) drug therapy: Secondary | ICD-10-CM | POA: Diagnosis not present

## 2017-10-11 DIAGNOSIS — M1612 Unilateral primary osteoarthritis, left hip: Secondary | ICD-10-CM | POA: Diagnosis not present

## 2017-10-11 DIAGNOSIS — R1111 Vomiting without nausea: Secondary | ICD-10-CM | POA: Diagnosis not present

## 2017-10-11 DIAGNOSIS — R110 Nausea: Secondary | ICD-10-CM | POA: Diagnosis not present

## 2017-10-11 DIAGNOSIS — R109 Unspecified abdominal pain: Secondary | ICD-10-CM | POA: Diagnosis present

## 2017-10-11 LAB — CBC WITH DIFFERENTIAL/PLATELET
BASOS ABS: 0 10*3/uL (ref 0.0–0.1)
Basophils Relative: 0 %
EOS PCT: 1 %
Eosinophils Absolute: 0.1 10*3/uL (ref 0.0–0.7)
HCT: 40 % (ref 36.0–46.0)
HEMOGLOBIN: 14.1 g/dL (ref 12.0–15.0)
LYMPHS ABS: 1.3 10*3/uL (ref 0.7–4.0)
LYMPHS PCT: 12 %
MCH: 30.7 pg (ref 26.0–34.0)
MCHC: 35.3 g/dL (ref 30.0–36.0)
MCV: 87.1 fL (ref 78.0–100.0)
Monocytes Absolute: 0.8 10*3/uL (ref 0.1–1.0)
Monocytes Relative: 7 %
NEUTROS ABS: 8.2 10*3/uL — AB (ref 1.7–7.7)
NEUTROS PCT: 80 %
PLATELETS: 421 10*3/uL — AB (ref 150–400)
RBC: 4.59 MIL/uL (ref 3.87–5.11)
RDW: 13.9 % (ref 11.5–15.5)
WBC: 10.3 10*3/uL (ref 4.0–10.5)

## 2017-10-11 LAB — COMPREHENSIVE METABOLIC PANEL
ALT: 101 U/L — AB (ref 14–54)
ANION GAP: 11 (ref 5–15)
AST: 66 U/L — ABNORMAL HIGH (ref 15–41)
Albumin: 3.2 g/dL — ABNORMAL LOW (ref 3.5–5.0)
Alkaline Phosphatase: 86 U/L (ref 38–126)
BUN: 22 mg/dL — ABNORMAL HIGH (ref 6–20)
CHLORIDE: 92 mmol/L — AB (ref 101–111)
CO2: 26 mmol/L (ref 22–32)
Calcium: 8.7 mg/dL — ABNORMAL LOW (ref 8.9–10.3)
Creatinine, Ser: 0.62 mg/dL (ref 0.44–1.00)
Glucose, Bld: 123 mg/dL — ABNORMAL HIGH (ref 65–99)
POTASSIUM: 2.8 mmol/L — AB (ref 3.5–5.1)
SODIUM: 129 mmol/L — AB (ref 135–145)
Total Bilirubin: 0.6 mg/dL (ref 0.3–1.2)
Total Protein: 7.4 g/dL (ref 6.5–8.1)

## 2017-10-11 LAB — LIPASE, BLOOD: LIPASE: 37 U/L (ref 11–51)

## 2017-10-11 LAB — I-STAT CG4 LACTIC ACID, ED: Lactic Acid, Venous: 0.98 mmol/L (ref 0.5–1.9)

## 2017-10-11 MED ORDER — ONDANSETRON HCL 4 MG/2ML IJ SOLN
4.0000 mg | Freq: Once | INTRAMUSCULAR | Status: DC
  Filled 2017-10-11: qty 2

## 2017-10-11 MED ORDER — SODIUM CHLORIDE 0.9 % IV BOLUS (SEPSIS)
1000.0000 mL | Freq: Once | INTRAVENOUS | Status: AC
  Administered 2017-10-11: 1000 mL via INTRAVENOUS

## 2017-10-11 MED ORDER — POTASSIUM CHLORIDE 10 MEQ/100ML IV SOLN
10.0000 meq | INTRAVENOUS | Status: AC
  Administered 2017-10-11 – 2017-10-12 (×2): 10 meq via INTRAVENOUS
  Filled 2017-10-11 (×2): qty 100

## 2017-10-11 MED ORDER — IOPAMIDOL (ISOVUE-300) INJECTION 61%
INTRAVENOUS | Status: AC
  Administered 2017-10-11: 100 mL
  Filled 2017-10-11: qty 100

## 2017-10-11 NOTE — ED Notes (Signed)
Bed: [facility] Expected date:  Expected time:  Means of arrival:  Comments: EMS knee replacement/constipation due to Vicodin

## 2017-10-11 NOTE — ED Triage Notes (Signed)
Per EMS, pt it coming from home with complaints of abdominal pain, constipation, and rectal pain. Pt recently had a left knee replacement on 10/02/17 and has been taking vicodin. Pt has had constipation and abdominal pain for 2 days. Pt has tried suppositories and miralax with no relief. Pt reports n/v at approx. 1730 today. Pt AO x4 and ambulates independently with a walker. Pt has a hx of hypertension.

## 2017-10-11 NOTE — ED Provider Notes (Signed)
[facility] DEPT Provider Note   CSN: [id number] Arrival date & time: 10/11/17  1906     History   Chief Complaint Chief Complaint  Patient presents with  . Abdominal Pain    HPI [name] is a 84 y.o. female.  84yo F w/ PMH including HTN, HLD, TIA who p/w abdominal pain and vomiting.  The patient had a left knee replacement on 10/02/17 and has been on Vicodin for pain control.  She reports that she has been healing appropriately from the surgery but over the past 2 days has been having worsening generalized abdominal pain associated with constipation.  Her last bowel movement was 2-3 days ago.  She had MiraLAX twice yesterday along with glycerin suppository and then magnesium citrate today with no relief.  She has noted some abdominal distention as well.  She reports some mild dysuria that she noticed this afternoon.  She had an episode of vomiting around 5:30 PM today.  Her pain is currently mild to moderate.  She denies any fevers, chest pain, or shortness of breath.  She is on Xarelto since surgery.   The history is provided by the patient.    Past Medical History:  Diagnosis Date  . Arthritis    "knees, hips, right foot, right hand" (09/01/2015)  . History of blood transfusion 2006   "w/knee OR  . Hyperlipidemia   . Hypertension   . Oral thrush   . Pneumonia    history of  . PONV (postoperative nausea and vomiting)    "just w/knee OR in 2006"  . 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 TIA (transient ischemic attack)    per ECHO 2008    Patient Active Problem List   Diagnosis Date Noted  . OA (osteoarthritis) of knee 10/02/2017  . Preretinal fibrosis 09/01/2015  . Preretinal fibrosis, left eye 08/06/2015  . Aspiration pneumonia ([place]) 07/13/2012  . Postop Altered mental state 07/12/2012  . Postop Acute blood loss anemia 07/10/2012  . Postop Hyponatremia 07/10/2012  . Postop Hypokalemia 07/10/2012  . OA  (osteoarthritis) of hip 07/09/2012    Past Surgical History:  Procedure Laterality Date  . [place] VITRECTOMY WITH 20 GAUGE MVR PORT Left 09/01/2015  . 25 GAUGE PARS PLANA VITRECTOMY WITH 20 GAUGE MVR PORT Left 09/01/2015   Procedure: 25 GAUGE PARS PLANA VITRECTOMY WITH 20 GAUGE MVR PORT;  Surgeon: Hayden Pedro, MD;  Location: [facility];  Service: Ophthalmology;  Laterality: Left;  . AIR/FLUID EXCHANGE Left 09/01/2015   Procedure: AIR/FLUID EXCHANGE;  Surgeon: Hayden Pedro, MD;  Location: [facility];  Service: Ophthalmology;  Laterality: Left;  . APPENDECTOMY  1970  . BREAST CYST EXCISION Bilateral 1960's  . CATARACT EXTRACTION W/ INTRAOCULAR LENS  IMPLANT, BILATERAL  2005  . EYE SURGERY    . JOINT REPLACEMENT    . LAPAROSCOPIC CHOLECYSTECTOMY  2000's  . LASER PHOTO ABLATION Left 09/01/2015   Procedure: LASER PHOTO ABLATION;  Surgeon: Hayden Pedro, MD;  Location: [facility];  Service: Ophthalmology;  Laterality: Left;  Endo laser  . MEMBRANE PEEL Left 09/01/2015   Procedure: MEMBRANE PEEL;  Surgeon: Hayden Pedro, MD;  Location: [facility];  Service: Ophthalmology;  Laterality: Left;  . MOHS SURGERY Left ~ 2010   lower lip  . RETINAL DETACHMENT SURGERY Left 2011  . TOTAL HIP ARTHROPLASTY  07/09/2012   Procedure: TOTAL HIP ARTHROPLASTY;  Surgeon: Gearlean Alf, MD;  Location:  [facility];  Service: Orthopedics;  Laterality: Right;  . TOTAL KNEE ARTHROPLASTY  2006  . TOTAL KNEE ARTHROPLASTY Left 10/02/2017   Procedure: LEFT TOTAL KNEE ARTHROPLASTY;  Surgeon: Gaynelle Arabian, MD;  Location: [facility];  Service: Orthopedics;  Laterality: Left;  Adductor Block  . TUBAL LIGATION    . VAGINAL HYSTERECTOMY  1984    OB History    Gravida Para Term Preterm AB Living             3   SAB TAB Ectopic Multiple Live Births                   Home Medications    Prior to Admission medications   Medication Sig Start Date End Date Taking? Authorizing Provider  acetaminophen (TYLENOL) 500 MG tablet  Take 1,000 mg by mouth every 6 (six) hours as needed for mild pain.   Yes [provider]  fluticasone (FLONASE) 50 MCG/ACT nasal spray Place 1 spray into both nostrils at bedtime as needed for allergies or rhinitis.   Yes [provider]  iron polysaccharides (NIFEREX) 150 MG capsule Take 1 capsule (150 mg total) by mouth 2 (two) times daily. 10/05/17  Yes Perkins, Alexzandrew L, PA-C  methocarbamol (ROBAXIN) 500 MG tablet Take 1 tablet (500 mg total) by mouth every 6 (six) hours as needed for muscle spasms. 10/05/17  Yes Perkins, Alexzandrew L, PA-C  Propylene Glycol (SYSTANE BALANCE OP) Place 1 drop into both eyes 4 (four) times daily.    Yes [provider]  rivaroxaban (XARELTO) 10 MG TABS tablet Take 1 tablet (10 mg total) by mouth daily with breakfast. Take Xarelto for two and a half more weeks following discharge from the hospital, then discontinue Xarelto. Once the patient has completed the Xarelto, they may resume the 81 mg Aspirin. 10/05/17  Yes Perkins, Alexzandrew L, PA-C  simvastatin (ZOCOR) 20 MG tablet Take 20 mg by mouth at bedtime.    Yes [provider]  triamterene-hydrochlorothiazide (MAXZIDE-25) 37.5-25 MG tablet Take 1 tablet by mouth daily.   Yes [provider]  bacitracin-polymyxin b (POLYSPORIN) ophthalmic ointment Place 1 application into the left eye 3 (three) times daily. apply to eye every 12 hours while awake Patient not taking: Reported on 09/15/2017 09/02/15   Hayden Pedro, MD  gatifloxacin (ZYMAXID) 0.5 % SOLN Place 1 drop into the left eye 4 (four) times daily. Patient not taking: Reported on 04/26/2017 09/02/15   Hayden Pedro, MD  HYDROcodone-acetaminophen (NORCO/VICODIN) 5-325 MG tablet Take 1 tablet by mouth every 6 (six) hours as needed for moderate pain.  10/06/17   [provider]  prednisoLONE acetate (PRED FORTE) 1 % ophthalmic suspension Place 1 drop into the left eye 4 (four) times daily. Patient not  taking: Reported on 09/15/2017 09/02/15   Hayden Pedro, MD  traMADol (ULTRAM) 50 MG tablet Take 1-2 tablets (50-100 mg total) by mouth every 6 (six) hours as needed (mild pain). Patient not taking: Reported on 10/11/2017 10/05/17   Joelene Millin, PA-C    Family History Family History  Problem Relation Age of Onset  . Cancer Father   . Heart attack Mother     Social History Social History   Tobacco Use  . Smoking status: Never Smoker  . Smokeless tobacco: Never Used  Substance Use Topics  . Alcohol use: No  . Drug use: No     Allergies   Oxycodone and Tape   Review of Systems Review  of Systems All other systems reviewed and are negative except that which was mentioned in HPI   Physical Exam Updated Vital Signs BP (!) 148/70 (BP Location: Left Arm)   Pulse 86   Temp 98.3 F (36.8 C) (Oral)   Resp 18   SpO2 100%   Physical Exam  Constitutional: She is oriented to person, place, and time. She appears well-developed and well-nourished. No distress.  HENT:  Head: Normocephalic and atraumatic.  Moist mucous membranes  Eyes: Conjunctivae are normal. Pupils are equal, round, and reactive to light.  Neck: Neck supple.  Cardiovascular: Normal rate, regular rhythm and normal heart sounds.  No murmur heard. Pulmonary/Chest: Effort normal and breath sounds normal.  Abdominal: Soft. Bowel sounds are normal. She exhibits distension. There is tenderness.  Moderately distended abdomen, generalized tenderness worst in RUQ and RLQ, no peritonitis or guarding  Musculoskeletal: She exhibits no edema.  Mild edema and ecchymosis L lower leg, 2+ DP pulses; incision site over L knee clean/dry/intact with skin tear just distal to surgical site  Neurological: She is alert and oriented to person, place, and time.  Fluent speech  Skin: Skin is warm and dry.  Psychiatric: She has a normal mood and affect. Judgment normal.  Nursing note and vitals reviewed.    ED Treatments  / Results  Labs (all labs ordered are listed, but only abnormal results are displayed) Labs Reviewed  COMPREHENSIVE METABOLIC PANEL - Abnormal; Notable for the following components:      Result Value   Sodium 129 (*)    Potassium 2.8 (*)    Chloride 92 (*)    Glucose, Bld 123 (*)    BUN 22 (*)    Calcium 8.7 (*)    Albumin 3.2 (*)    AST 66 (*)    ALT 101 (*)    All other components within normal limits  CBC WITH DIFFERENTIAL/PLATELET - Abnormal; Notable for the following components:   Platelets 421 (*)    Neutro Abs 8.2 (*)    All other components within normal limits  LIPASE, BLOOD  URINALYSIS, ROUTINE W REFLEX MICROSCOPIC  I-STAT CG4 LACTIC ACID, ED  I-STAT CG4 LACTIC ACID, ED    EKG  EKG Interpretation None       Radiology Ct Abdomen Pelvis W Contrast  Result Date: 10/11/2017 CLINICAL DATA:  84 year old female with abdominal discomfort. EXAM: CT ABDOMEN AND PELVIS WITH CONTRAST TECHNIQUE: Multidetector CT imaging of the abdomen and pelvis was performed using the standard protocol following bolus administration of intravenous contrast. CONTRAST:  122mL ISOVUE-300 IOPAMIDOL (ISOVUE-300) INJECTION 61% COMPARISON:  Lumbar spine MRI dated 04/12/2012 FINDINGS: Lower chest: Small scattered calcified granuloma. There is no intra-abdominal free air or free fluid. Hepatobiliary: Multiple hepatic hypodense lesions measuring up to 3.2 cm in the left lobe of the liver consistent with cysts. A subcentimeter hypodense lesion in the right lobe of the liver series 2, image 25) is not well characterized but also likely represents a cyst or hemangioma. There is probable underlying mild fatty infiltration. No intrahepatic biliary ductal dilatation. Cholecystectomy. Pancreas: Unremarkable. No pancreatic ductal dilatation or surrounding inflammatory changes. Spleen: Normal in size without focal abnormality. Adrenals/Urinary Tract: The adrenal glands are unremarkable. 3.5 cm left renal upper pole  cyst. Smaller bilateral renal hypodense lesions are not well characterized but most consistent with cysts as seen on the prior lumbar MRI. There is no hydronephrosis on either side. There is symmetric enhancement and excretion of contrast. The visualized ureters appear unremarkable. The  urinary bladder is distended. Stomach/Bowel: Large amount of dense stool noted at the rectal vault consistent with fecal impaction. There is sigmoid diverticulosis without active inflammatory changes. Liquid stool noted throughout the colon. There is no bowel dilatation or evidence of obstruction. There is a small hiatal hernia. Appendectomy. Vascular/Lymphatic: Moderate aortoiliac atherosclerotic disease. The IVC is unremarkable. No portal venous gas. There is no adenopathy. Reproductive: Hysterectomy.  No pelvic mass. Other: None Musculoskeletal: Right hip arthroplasty. No acute osseous pathology. There is scoliosis with degenerative changes of the spine. IMPRESSION: 1. Large impacted appearing stool the rectal vault. Liquid stool throughout the remainder of the colon. No bowel obstruction. 2. Sigmoid diverticulosis. 3. Distended urinary bladder. 4.  Aortic Atherosclerosis (ICD10-I70.0). Electronically Signed   By: Anner Crete M.D.   On: 10/11/2017 23:30    Procedures Procedures (including critical care time)  Medications Ordered in ED Medications  ondansetron (ZOFRAN) injection 4 mg (4 mg Intravenous Refused 10/11/17 2236)  potassium chloride 10 mEq in 100 mL IVPB (0 mEq Intravenous Stopped 10/12/17 0023)  acetaminophen (TYLENOL) solution 1,000 mg (not administered)  methocarbamol (ROBAXIN) tablet 500 mg (not administered)  sodium chloride 0.9 % bolus 1,000 mL (1,000 mLs Intravenous New Bag/Given 10/11/17 2239)  iopamidol (ISOVUE-300) 61 % injection (100 mLs  Contrast Given 10/11/17 2305)     Initial Impression / Assessment and Plan / ED Course  I have reviewed the triage vital signs and the nursing  notes.  Pertinent labs & imaging results that were available during my care of the patient were reviewed by me and considered in my medical decision making (see chart for details).     PT w/ L knee replacement 9 days ago, on narcotics, w/ worsening abd pain, distension, constipation, and 1 episode of vomiting today. She was non-toxic on exam, moderate distension and mild generalized tenderness. DDx includes ileus, bowel obstruction, or severe constipation. Obtained labs and CT due to concerning exam.  Labs show Na 129, K 2.8, normal Cr, normal CBC. CT shows fecal impaction and liquid stool in colon; distended bladder. Pt later had a large BM in ED and felt like she relieved her stool obstruction. She reported resolution of pain. We are awaiting urine sample. I discussed CT results with pt and family. They voiced concern about pt's ability to walk at home. We will give home pain medication for knee and attempt ambulation. I am signing out to Dr. Randal Buba pending UA, walking trial.  Final Clinical Impressions(s) / ED Diagnoses   Final diagnoses:  None    ED Discharge Orders    None       Little, Wenda Overland, MD 10/12/17 [phone]

## 2017-10-11 NOTE — ED Notes (Signed)
Bed: KT62 Expected date: 10/11/17 Expected time: 7:06 PM Means of arrival:  Comments: No bed

## 2017-10-12 ENCOUNTER — Other Ambulatory Visit: Payer: Self-pay

## 2017-10-12 DIAGNOSIS — R100 Acute abdomen: Secondary | ICD-10-CM | POA: Diagnosis not present

## 2017-10-12 DIAGNOSIS — K5900 Constipation, unspecified: Secondary | ICD-10-CM | POA: Diagnosis not present

## 2017-10-12 DIAGNOSIS — K5903 Drug induced constipation: Secondary | ICD-10-CM | POA: Diagnosis not present

## 2017-10-12 LAB — URINALYSIS, ROUTINE W REFLEX MICROSCOPIC
Bacteria, UA: NONE SEEN
Bilirubin Urine: NEGATIVE
GLUCOSE, UA: NEGATIVE mg/dL
Ketones, ur: NEGATIVE mg/dL
Leukocytes, UA: NEGATIVE
NITRITE: NEGATIVE
PH: 7 (ref 5.0–8.0)
Protein, ur: NEGATIVE mg/dL
Specific Gravity, Urine: 1.016 (ref 1.005–1.030)
Squamous Epithelial / LPF: NONE SEEN
WBC, UA: NONE SEEN WBC/hpf (ref 0–5)

## 2017-10-12 MED ORDER — POTASSIUM CHLORIDE CRYS ER 20 MEQ PO TBCR
40.0000 meq | EXTENDED_RELEASE_TABLET | Freq: Once | ORAL | Status: AC
  Administered 2017-10-12: 40 meq via ORAL
  Filled 2017-10-12: qty 2

## 2017-10-12 MED ORDER — POTASSIUM CHLORIDE ER 10 MEQ PO TBCR: 10.0000 meq | EXTENDED_RELEASE_TABLET | Freq: Every day | ORAL | 0 refills | Status: DC

## 2017-10-12 MED ORDER — KETOROLAC TROMETHAMINE 30 MG/ML IJ SOLN
15.0000 mg | Freq: Once | INTRAMUSCULAR | Status: AC
  Administered 2017-10-12: 15 mg via INTRAVENOUS
  Filled 2017-10-12: qty 1

## 2017-10-12 MED ORDER — METHOCARBAMOL 500 MG PO TABS
500.0000 mg | ORAL_TABLET | Freq: Once | ORAL | Status: AC
  Administered 2017-10-12: 500 mg via ORAL
  Filled 2017-10-12: qty 1

## 2017-10-12 MED ORDER — ACETAMINOPHEN 160 MG/5ML PO SOLN
1000.0000 mg | Freq: Once | ORAL | Status: AC
  Administered 2017-10-12: 1000 mg via ORAL
  Filled 2017-10-12: qty 40.6

## 2017-10-12 NOTE — ED Notes (Signed)
Patient ambulated well but would like to rest now.

## 2017-10-13 DIAGNOSIS — M5136 Other intervertebral disc degeneration, lumbar region: Secondary | ICD-10-CM | POA: Diagnosis not present

## 2017-10-13 DIAGNOSIS — H547 Unspecified visual loss: Secondary | ICD-10-CM | POA: Diagnosis not present

## 2017-10-13 DIAGNOSIS — M4125 Other idiopathic scoliosis, thoracolumbar region: Secondary | ICD-10-CM | POA: Diagnosis not present

## 2017-10-13 DIAGNOSIS — M1612 Unilateral primary osteoarthritis, left hip: Secondary | ICD-10-CM | POA: Diagnosis not present

## 2017-10-13 DIAGNOSIS — Z471 Aftercare following joint replacement surgery: Secondary | ICD-10-CM | POA: Diagnosis not present

## 2017-10-13 DIAGNOSIS — I10 Essential (primary) hypertension: Secondary | ICD-10-CM | POA: Diagnosis not present

## 2017-10-16 DIAGNOSIS — Z471 Aftercare following joint replacement surgery: Secondary | ICD-10-CM | POA: Diagnosis not present

## 2017-10-16 DIAGNOSIS — M1612 Unilateral primary osteoarthritis, left hip: Secondary | ICD-10-CM | POA: Diagnosis not present

## 2017-10-16 DIAGNOSIS — I10 Essential (primary) hypertension: Secondary | ICD-10-CM | POA: Diagnosis not present

## 2017-10-16 DIAGNOSIS — M4125 Other idiopathic scoliosis, thoracolumbar region: Secondary | ICD-10-CM | POA: Diagnosis not present

## 2017-10-16 DIAGNOSIS — H547 Unspecified visual loss: Secondary | ICD-10-CM | POA: Diagnosis not present

## 2017-10-16 DIAGNOSIS — M5136 Other intervertebral disc degeneration, lumbar region: Secondary | ICD-10-CM | POA: Diagnosis not present

## 2017-10-17 DIAGNOSIS — Z96652 Presence of left artificial knee joint: Secondary | ICD-10-CM | POA: Diagnosis not present

## 2017-10-18 DIAGNOSIS — M1612 Unilateral primary osteoarthritis, left hip: Secondary | ICD-10-CM | POA: Diagnosis not present

## 2017-10-18 DIAGNOSIS — Z471 Aftercare following joint replacement surgery: Secondary | ICD-10-CM | POA: Diagnosis not present

## 2017-10-18 DIAGNOSIS — M4125 Other idiopathic scoliosis, thoracolumbar region: Secondary | ICD-10-CM | POA: Diagnosis not present

## 2017-10-18 DIAGNOSIS — I10 Essential (primary) hypertension: Secondary | ICD-10-CM | POA: Diagnosis not present

## 2017-10-18 DIAGNOSIS — M5136 Other intervertebral disc degeneration, lumbar region: Secondary | ICD-10-CM | POA: Diagnosis not present

## 2017-10-18 DIAGNOSIS — H547 Unspecified visual loss: Secondary | ICD-10-CM | POA: Diagnosis not present

## 2017-10-20 DIAGNOSIS — H547 Unspecified visual loss: Secondary | ICD-10-CM | POA: Diagnosis not present

## 2017-10-20 DIAGNOSIS — M1612 Unilateral primary osteoarthritis, left hip: Secondary | ICD-10-CM | POA: Diagnosis not present

## 2017-10-20 DIAGNOSIS — M5136 Other intervertebral disc degeneration, lumbar region: Secondary | ICD-10-CM | POA: Diagnosis not present

## 2017-10-20 DIAGNOSIS — I10 Essential (primary) hypertension: Secondary | ICD-10-CM | POA: Diagnosis not present

## 2017-10-20 DIAGNOSIS — Z471 Aftercare following joint replacement surgery: Secondary | ICD-10-CM | POA: Diagnosis not present

## 2017-10-20 DIAGNOSIS — M4125 Other idiopathic scoliosis, thoracolumbar region: Secondary | ICD-10-CM | POA: Diagnosis not present

## 2017-10-24 DIAGNOSIS — Z471 Aftercare following joint replacement surgery: Secondary | ICD-10-CM | POA: Diagnosis not present

## 2017-10-24 DIAGNOSIS — M1612 Unilateral primary osteoarthritis, left hip: Secondary | ICD-10-CM | POA: Diagnosis not present

## 2017-10-24 DIAGNOSIS — I10 Essential (primary) hypertension: Secondary | ICD-10-CM | POA: Diagnosis not present

## 2017-10-24 DIAGNOSIS — H547 Unspecified visual loss: Secondary | ICD-10-CM | POA: Diagnosis not present

## 2017-10-24 DIAGNOSIS — M4125 Other idiopathic scoliosis, thoracolumbar region: Secondary | ICD-10-CM | POA: Diagnosis not present

## 2017-10-24 DIAGNOSIS — M5136 Other intervertebral disc degeneration, lumbar region: Secondary | ICD-10-CM | POA: Diagnosis not present

## 2017-10-27 DIAGNOSIS — M1612 Unilateral primary osteoarthritis, left hip: Secondary | ICD-10-CM | POA: Diagnosis not present

## 2017-10-27 DIAGNOSIS — Z471 Aftercare following joint replacement surgery: Secondary | ICD-10-CM | POA: Diagnosis not present

## 2017-10-27 DIAGNOSIS — M4125 Other idiopathic scoliosis, thoracolumbar region: Secondary | ICD-10-CM | POA: Diagnosis not present

## 2017-10-27 DIAGNOSIS — M5136 Other intervertebral disc degeneration, lumbar region: Secondary | ICD-10-CM | POA: Diagnosis not present

## 2017-10-27 DIAGNOSIS — I10 Essential (primary) hypertension: Secondary | ICD-10-CM | POA: Diagnosis not present

## 2017-10-27 DIAGNOSIS — H547 Unspecified visual loss: Secondary | ICD-10-CM | POA: Diagnosis not present

## 2017-10-31 DIAGNOSIS — M25562 Pain in left knee: Secondary | ICD-10-CM | POA: Diagnosis not present

## 2017-11-02 DIAGNOSIS — M25562 Pain in left knee: Secondary | ICD-10-CM | POA: Diagnosis not present

## 2017-11-06 DIAGNOSIS — M25562 Pain in left knee: Secondary | ICD-10-CM | POA: Diagnosis not present

## 2017-11-07 DIAGNOSIS — Z471 Aftercare following joint replacement surgery: Secondary | ICD-10-CM | POA: Diagnosis not present

## 2017-11-07 DIAGNOSIS — Z96652 Presence of left artificial knee joint: Secondary | ICD-10-CM | POA: Diagnosis not present

## 2017-11-09 DIAGNOSIS — M25562 Pain in left knee: Secondary | ICD-10-CM | POA: Diagnosis not present

## 2017-11-13 DIAGNOSIS — M25562 Pain in left knee: Secondary | ICD-10-CM | POA: Diagnosis not present

## 2017-11-16 DIAGNOSIS — M25562 Pain in left knee: Secondary | ICD-10-CM | POA: Diagnosis not present

## 2017-12-15 DIAGNOSIS — D649 Anemia, unspecified: Secondary | ICD-10-CM | POA: Diagnosis not present

## 2017-12-15 DIAGNOSIS — Z0000 Encounter for general adult medical examination without abnormal findings: Secondary | ICD-10-CM | POA: Diagnosis not present

## 2017-12-15 DIAGNOSIS — I10 Essential (primary) hypertension: Secondary | ICD-10-CM | POA: Diagnosis not present

## 2017-12-15 DIAGNOSIS — M1990 Unspecified osteoarthritis, unspecified site: Secondary | ICD-10-CM | POA: Diagnosis not present

## 2017-12-15 DIAGNOSIS — E7800 Pure hypercholesterolemia, unspecified: Secondary | ICD-10-CM | POA: Diagnosis not present

## 2017-12-15 DIAGNOSIS — I679 Cerebrovascular disease, unspecified: Secondary | ICD-10-CM | POA: Diagnosis not present

## 2018-01-05 DIAGNOSIS — H353111 Nonexudative age-related macular degeneration, right eye, early dry stage: Secondary | ICD-10-CM | POA: Diagnosis not present

## 2018-01-05 DIAGNOSIS — H31002 Unspecified chorioretinal scars, left eye: Secondary | ICD-10-CM | POA: Diagnosis not present

## 2018-01-05 DIAGNOSIS — H52223 Regular astigmatism, bilateral: Secondary | ICD-10-CM | POA: Diagnosis not present

## 2018-01-05 DIAGNOSIS — H04123 Dry eye syndrome of bilateral lacrimal glands: Secondary | ICD-10-CM | POA: Diagnosis not present

## 2018-01-05 DIAGNOSIS — H353121 Nonexudative age-related macular degeneration, left eye, early dry stage: Secondary | ICD-10-CM | POA: Diagnosis not present

## 2018-01-05 DIAGNOSIS — H5213 Myopia, bilateral: Secondary | ICD-10-CM | POA: Diagnosis not present

## 2018-01-05 DIAGNOSIS — Z961 Presence of intraocular lens: Secondary | ICD-10-CM | POA: Diagnosis not present

## 2018-01-05 DIAGNOSIS — H59812 Chorioretinal scars after surgery for detachment, left eye: Secondary | ICD-10-CM | POA: Diagnosis not present

## 2018-01-10 DIAGNOSIS — D692 Other nonthrombocytopenic purpura: Secondary | ICD-10-CM | POA: Diagnosis not present

## 2018-01-10 DIAGNOSIS — L570 Actinic keratosis: Secondary | ICD-10-CM | POA: Diagnosis not present

## 2018-01-10 DIAGNOSIS — Z85828 Personal history of other malignant neoplasm of skin: Secondary | ICD-10-CM | POA: Diagnosis not present

## 2018-01-10 DIAGNOSIS — L821 Other seborrheic keratosis: Secondary | ICD-10-CM | POA: Diagnosis not present

## 2018-01-29 ENCOUNTER — Ambulatory Visit (INDEPENDENT_AMBULATORY_CARE_PROVIDER_SITE_OTHER): Payer: Medicare Other | Admitting: Ophthalmology

## 2018-01-30 DIAGNOSIS — Z96652 Presence of left artificial knee joint: Secondary | ICD-10-CM | POA: Diagnosis not present

## 2018-01-30 DIAGNOSIS — M1712 Unilateral primary osteoarthritis, left knee: Secondary | ICD-10-CM | POA: Diagnosis not present

## 2018-02-05 ENCOUNTER — Encounter (INDEPENDENT_AMBULATORY_CARE_PROVIDER_SITE_OTHER): Payer: Medicare Other | Admitting: Ophthalmology

## 2018-02-05 DIAGNOSIS — H35033 Hypertensive retinopathy, bilateral: Secondary | ICD-10-CM | POA: Diagnosis not present

## 2018-02-05 DIAGNOSIS — H35372 Puckering of macula, left eye: Secondary | ICD-10-CM | POA: Diagnosis not present

## 2018-02-05 DIAGNOSIS — H43811 Vitreous degeneration, right eye: Secondary | ICD-10-CM

## 2018-02-05 DIAGNOSIS — I10 Essential (primary) hypertension: Secondary | ICD-10-CM

## 2018-02-05 DIAGNOSIS — H338 Other retinal detachments: Secondary | ICD-10-CM | POA: Diagnosis not present

## 2018-02-09 DIAGNOSIS — M25562 Pain in left knee: Secondary | ICD-10-CM | POA: Diagnosis not present

## 2018-02-22 DIAGNOSIS — M19011 Primary osteoarthritis, right shoulder: Secondary | ICD-10-CM | POA: Diagnosis not present

## 2018-02-22 DIAGNOSIS — M25511 Pain in right shoulder: Secondary | ICD-10-CM | POA: Diagnosis not present

## 2018-03-21 DIAGNOSIS — M25521 Pain in right elbow: Secondary | ICD-10-CM | POA: Diagnosis not present

## 2018-03-21 DIAGNOSIS — M19011 Primary osteoarthritis, right shoulder: Secondary | ICD-10-CM | POA: Diagnosis not present

## 2018-03-21 DIAGNOSIS — M19021 Primary osteoarthritis, right elbow: Secondary | ICD-10-CM | POA: Diagnosis not present

## 2018-08-12 ENCOUNTER — Emergency Department (HOSPITAL_COMMUNITY): Payer: Medicare Other

## 2018-08-12 ENCOUNTER — Other Ambulatory Visit: Payer: Self-pay

## 2018-08-12 ENCOUNTER — Emergency Department (HOSPITAL_COMMUNITY)
Admission: EM | Admit: 2018-08-12 | Discharge: 2018-08-12 | Disposition: A | Discharge status: Home / Self Care | Payer: Medicare Other | Attending: Emergency Medicine | Admitting: Emergency Medicine

## 2018-08-12 DIAGNOSIS — Z8673 Personal history of transient ischemic attack (TIA), and cerebral infarction without residual deficits: Secondary | ICD-10-CM | POA: Insufficient documentation

## 2018-08-12 DIAGNOSIS — Y92481 Parking lot as the place of occurrence of the external cause: Secondary | ICD-10-CM | POA: Diagnosis not present

## 2018-08-12 DIAGNOSIS — S8012XA Contusion of left lower leg, initial encounter: Secondary | ICD-10-CM | POA: Diagnosis not present

## 2018-08-12 DIAGNOSIS — I10 Essential (primary) hypertension: Secondary | ICD-10-CM | POA: Diagnosis not present

## 2018-08-12 DIAGNOSIS — Y9389 Activity, other specified: Secondary | ICD-10-CM | POA: Diagnosis not present

## 2018-08-12 DIAGNOSIS — M79604 Pain in right leg: Secondary | ICD-10-CM | POA: Diagnosis not present

## 2018-08-12 DIAGNOSIS — S79911A Unspecified injury of right hip, initial encounter: Secondary | ICD-10-CM | POA: Diagnosis not present

## 2018-08-12 DIAGNOSIS — M7989 Other specified soft tissue disorders: Secondary | ICD-10-CM | POA: Diagnosis not present

## 2018-08-12 DIAGNOSIS — R52 Pain, unspecified: Secondary | ICD-10-CM | POA: Diagnosis not present

## 2018-08-12 DIAGNOSIS — S8991XA Unspecified injury of right lower leg, initial encounter: Secondary | ICD-10-CM | POA: Diagnosis not present

## 2018-08-12 DIAGNOSIS — Y998 Other external cause status: Secondary | ICD-10-CM | POA: Diagnosis not present

## 2018-08-12 DIAGNOSIS — S8010XA Contusion of unspecified lower leg, initial encounter: Secondary | ICD-10-CM

## 2018-08-12 DIAGNOSIS — Z79899 Other long term (current) drug therapy: Secondary | ICD-10-CM | POA: Insufficient documentation

## 2018-08-12 DIAGNOSIS — S8011XA Contusion of right lower leg, initial encounter: Secondary | ICD-10-CM | POA: Diagnosis not present

## 2018-08-12 DIAGNOSIS — E785 Hyperlipidemia, unspecified: Secondary | ICD-10-CM | POA: Insufficient documentation

## 2018-08-12 DIAGNOSIS — R609 Edema, unspecified: Secondary | ICD-10-CM | POA: Diagnosis not present

## 2018-08-12 DIAGNOSIS — Z7901 Long term (current) use of anticoagulants: Secondary | ICD-10-CM | POA: Insufficient documentation

## 2018-08-12 DIAGNOSIS — W228XXA Striking against or struck by other objects, initial encounter: Secondary | ICD-10-CM | POA: Diagnosis not present

## 2018-08-12 DIAGNOSIS — S8992XA Unspecified injury of left lower leg, initial encounter: Secondary | ICD-10-CM | POA: Diagnosis not present

## 2018-08-12 MED ORDER — ACETAMINOPHEN 500 MG PO TABS
1000.0000 mg | ORAL_TABLET | Freq: Once | ORAL | Status: AC
  Administered 2018-08-12: 1000 mg via ORAL
  Filled 2018-08-12: qty 2

## 2018-08-12 MED ORDER — MORPHINE SULFATE 15 MG PO TABS
7.5000 mg | ORAL_TABLET | ORAL | Status: DC | PRN
  Administered 2018-08-12: 7.5 mg via ORAL
  Filled 2018-08-12: qty 1
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
## 2018-08-12 NOTE — ED Notes (Signed)
Bed: WA03 Expected date:  Expected time:  Means of arrival:  Comments: 

## 2018-08-12 NOTE — Discharge Instructions (Signed)
Take tylenol 1000mg(2 extra strength) four times a day.  ° °
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
## 2018-08-16 DIAGNOSIS — S8011XA Contusion of right lower leg, initial encounter: Secondary | ICD-10-CM | POA: Diagnosis not present

## 2018-08-16 DIAGNOSIS — R58 Hemorrhage, not elsewhere classified: Secondary | ICD-10-CM | POA: Diagnosis not present

## 2018-08-16 DIAGNOSIS — S8012XA Contusion of left lower leg, initial encounter: Secondary | ICD-10-CM | POA: Diagnosis not present

## 2018-08-20 DIAGNOSIS — M25559 Pain in unspecified hip: Secondary | ICD-10-CM | POA: Diagnosis not present

## 2018-08-20 DIAGNOSIS — S8011XA Contusion of right lower leg, initial encounter: Secondary | ICD-10-CM | POA: Diagnosis not present

## 2018-08-20 DIAGNOSIS — S8012XA Contusion of left lower leg, initial encounter: Secondary | ICD-10-CM | POA: Diagnosis not present

## 2018-08-30 DIAGNOSIS — S8010XD Contusion of unspecified lower leg, subsequent encounter: Secondary | ICD-10-CM | POA: Diagnosis not present

## 2018-09-04 DIAGNOSIS — S8010XD Contusion of unspecified lower leg, subsequent encounter: Secondary | ICD-10-CM | POA: Diagnosis not present

## 2018-09-06 DIAGNOSIS — M25552 Pain in left hip: Secondary | ICD-10-CM | POA: Diagnosis not present

## 2018-09-06 DIAGNOSIS — Z96653 Presence of artificial knee joint, bilateral: Secondary | ICD-10-CM | POA: Diagnosis not present

## 2018-09-06 DIAGNOSIS — Z471 Aftercare following joint replacement surgery: Secondary | ICD-10-CM | POA: Diagnosis not present

## 2018-09-06 DIAGNOSIS — M1612 Unilateral primary osteoarthritis, left hip: Secondary | ICD-10-CM | POA: Diagnosis not present

## 2018-09-07 DIAGNOSIS — S8011XD Contusion of right lower leg, subsequent encounter: Secondary | ICD-10-CM | POA: Diagnosis not present

## 2018-09-07 DIAGNOSIS — S8012XD Contusion of left lower leg, subsequent encounter: Secondary | ICD-10-CM | POA: Diagnosis not present

## 2018-09-10 DIAGNOSIS — S8010XD Contusion of unspecified lower leg, subsequent encounter: Secondary | ICD-10-CM | POA: Diagnosis not present

## 2018-09-11 DIAGNOSIS — M25552 Pain in left hip: Secondary | ICD-10-CM | POA: Diagnosis not present

## 2018-09-13 DIAGNOSIS — S8012XD Contusion of left lower leg, subsequent encounter: Secondary | ICD-10-CM | POA: Diagnosis not present

## 2018-09-13 DIAGNOSIS — S8011XD Contusion of right lower leg, subsequent encounter: Secondary | ICD-10-CM | POA: Diagnosis not present

## 2018-09-17 DIAGNOSIS — S8010XD Contusion of unspecified lower leg, subsequent encounter: Secondary | ICD-10-CM | POA: Diagnosis not present

## 2018-09-24 DIAGNOSIS — S8010XD Contusion of unspecified lower leg, subsequent encounter: Secondary | ICD-10-CM | POA: Diagnosis not present

## 2018-09-26 DIAGNOSIS — U071 COVID-19: Secondary | ICD-10-CM

## 2018-09-26 HISTORY — DX: COVID-19: U07.1

## 2018-09-27 DIAGNOSIS — S8010XD Contusion of unspecified lower leg, subsequent encounter: Secondary | ICD-10-CM | POA: Diagnosis not present

## 2018-10-01 DIAGNOSIS — S8012XD Contusion of left lower leg, subsequent encounter: Secondary | ICD-10-CM | POA: Diagnosis not present

## 2018-10-01 DIAGNOSIS — S8011XD Contusion of right lower leg, subsequent encounter: Secondary | ICD-10-CM | POA: Diagnosis not present

## 2018-10-04 DIAGNOSIS — M1612 Unilateral primary osteoarthritis, left hip: Secondary | ICD-10-CM | POA: Diagnosis not present

## 2018-10-04 DIAGNOSIS — M25552 Pain in left hip: Secondary | ICD-10-CM | POA: Diagnosis not present

## 2018-10-10 DIAGNOSIS — M25552 Pain in left hip: Secondary | ICD-10-CM | POA: Diagnosis not present

## 2018-10-12 DIAGNOSIS — S8010XD Contusion of unspecified lower leg, subsequent encounter: Secondary | ICD-10-CM | POA: Diagnosis not present

## 2018-10-13 IMAGING — CT CT ABD-PELV W/ CM
2 of 5 series · 15 of 46 positions shown, 17 images · IV contrast (ISOVUE)
Comparison: Lumbar spine MRI dated 04/12/2012

CLINICAL DATA: 86-year-old female with abdominal discomfort.

EXAM:
CT ABDOMEN AND PELVIS WITH CONTRAST
TECHNIQUE: Multidetector CT imaging of the abdomen and pelvis was performed
using the standard protocol following bolus administration of
intravenous contrast.
CONTRAST:  100mL IWMYPC-TMM IOPAMIDOL (IWMYPC-TMM) INJECTION 61%

[Series 2: axial st · axial · 0.65mm/px · z∈[+1075,+1445]mm · 12 of 86 slices shown, 14 images]
[im 6/86  soft-tissue]
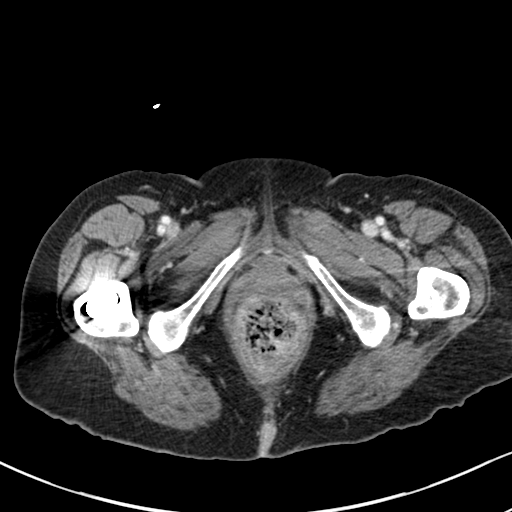
[im 6/86  bone]
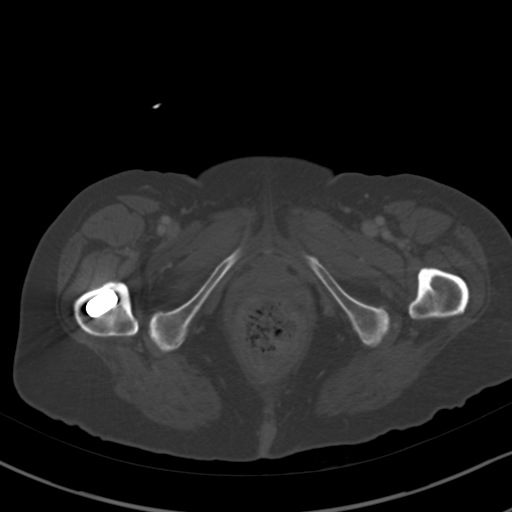
[im 11/86  soft-tissue]
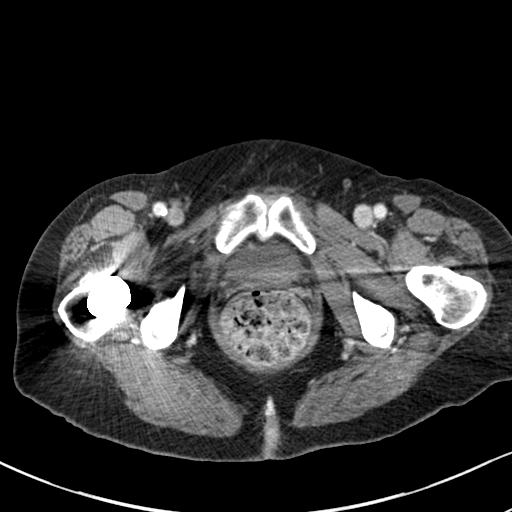
[im 22/86  soft-tissue]
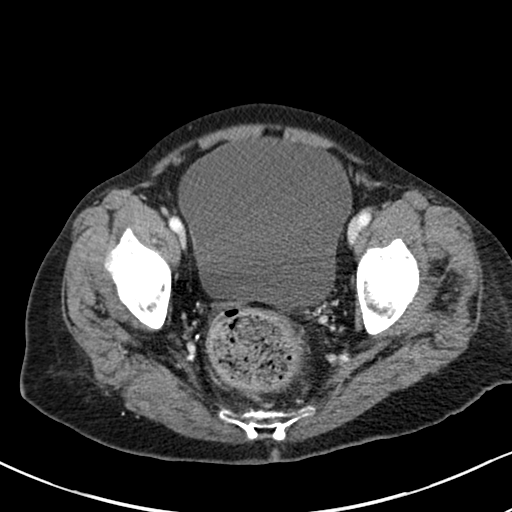
[im 27/86  soft-tissue]
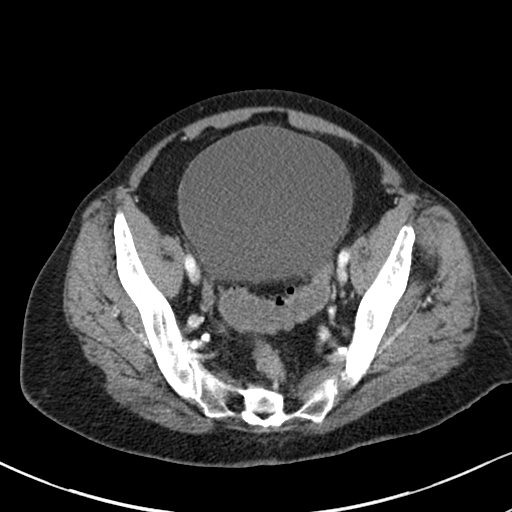
[im 32/86  soft-tissue]
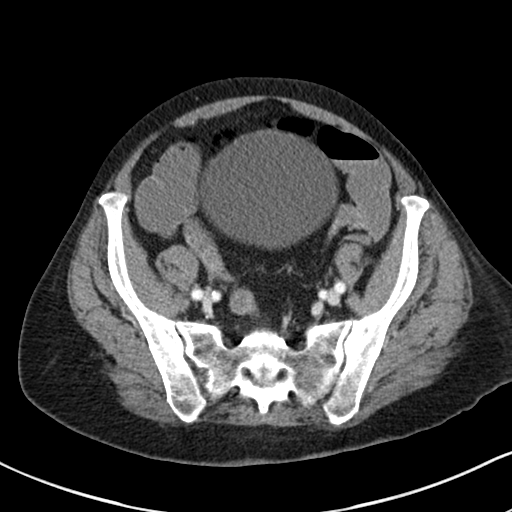
[im 38/86  soft-tissue]
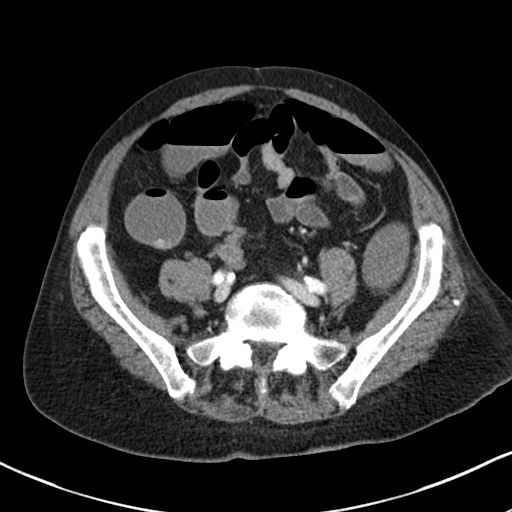
[im 48/86  soft-tissue]
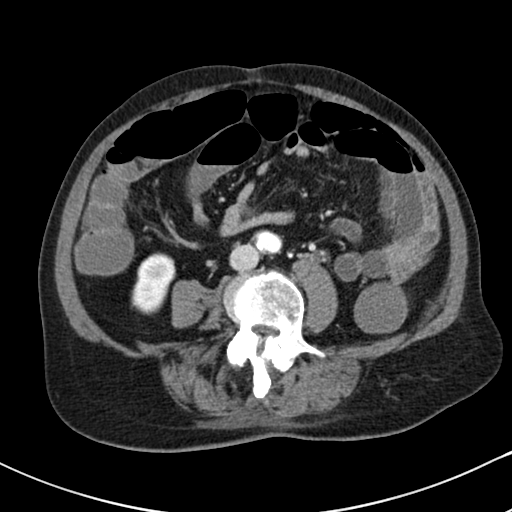
[im 54/86  soft-tissue]
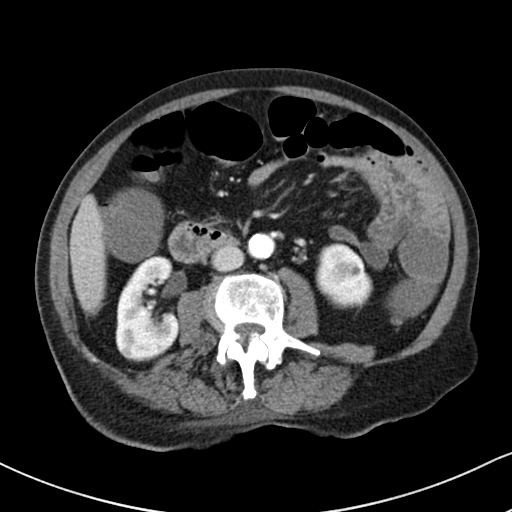
[im 59/86  soft-tissue]
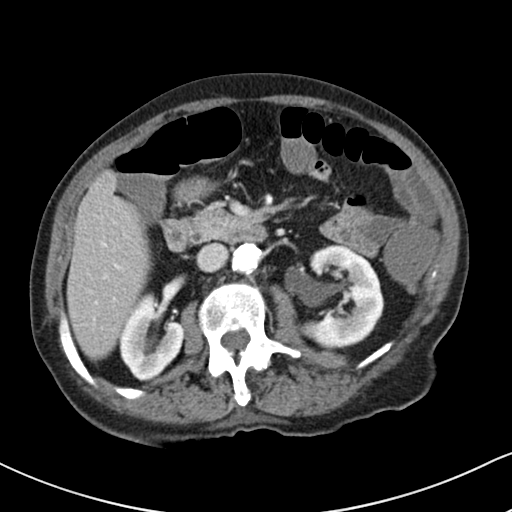
[im 59/86  bone]
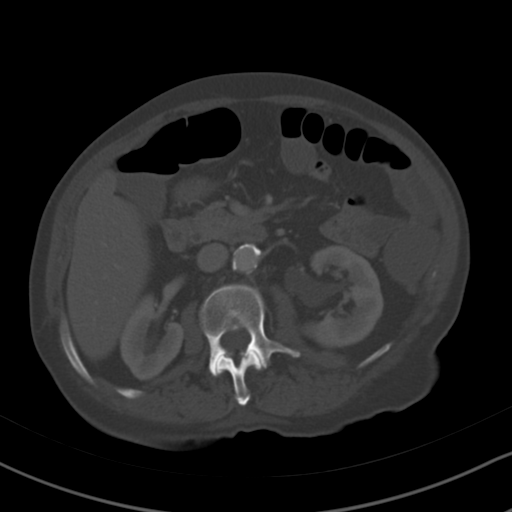
[im 64/86  soft-tissue]
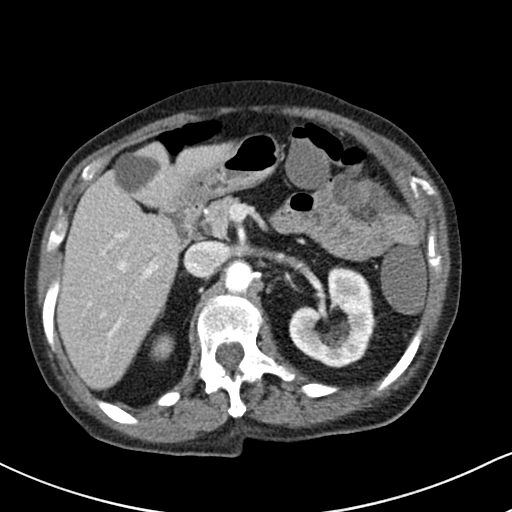
[im 75/86  soft-tissue]
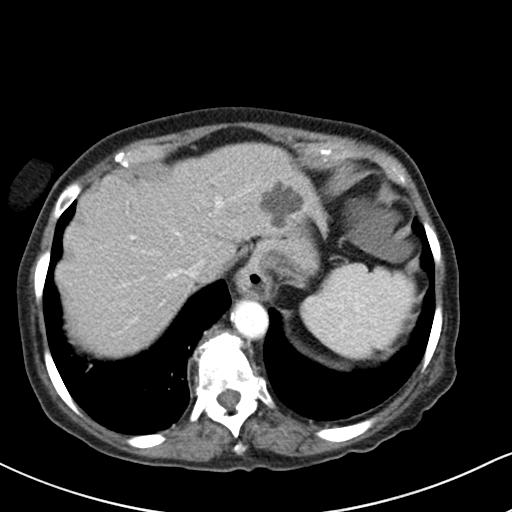
[im 80/86  soft-tissue]
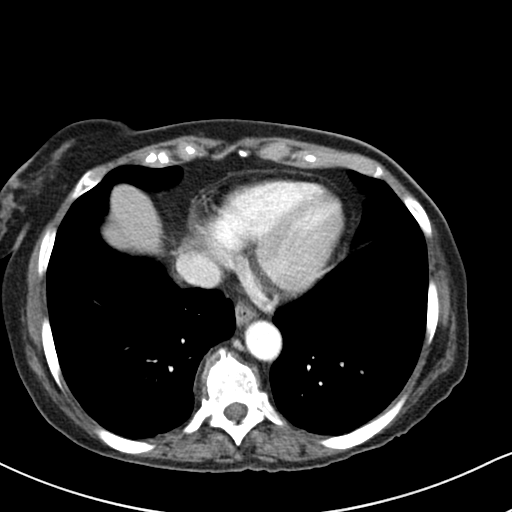

[Series 4: coronal st · coronal · 0.66mm/px · 3 of 91 slices shown]
[im 31/91  soft-tissue]
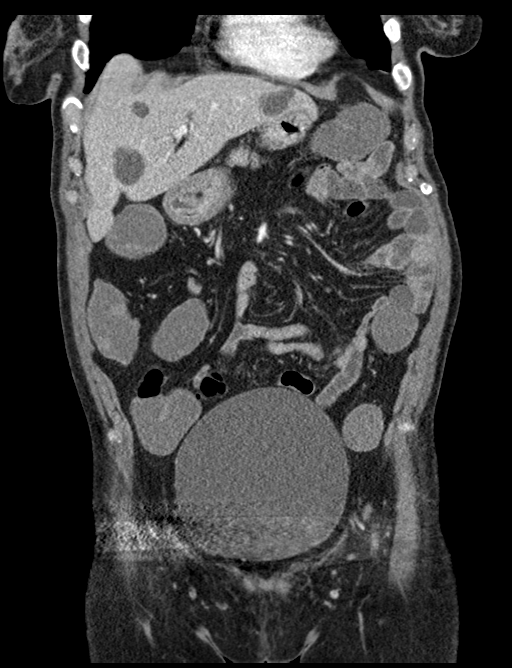
[im 41/91  soft-tissue]
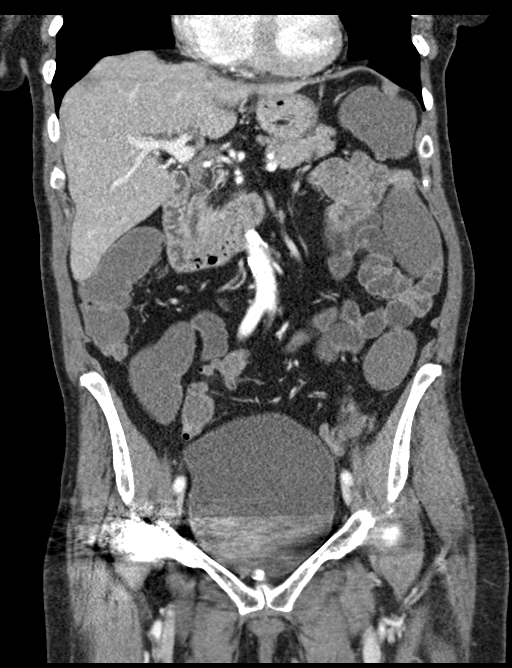
[im 51/91  soft-tissue]
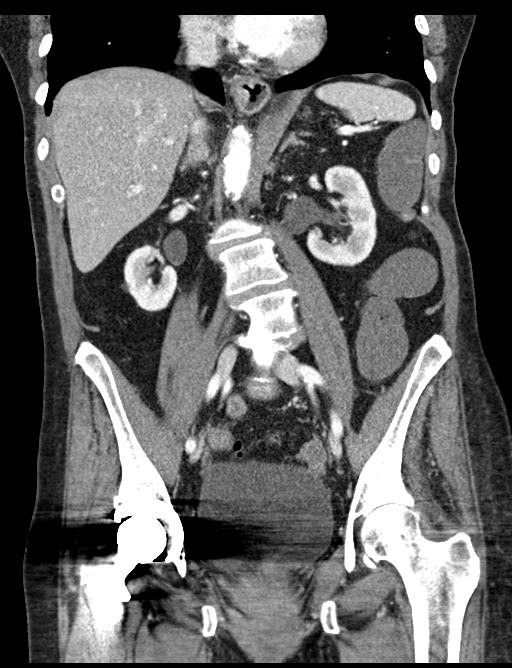

[15 of 46 positions shown; findings below may reference images not displayed]

FINDINGS: Lower chest: Small scattered calcified granuloma.

There is no intra-abdominal free air or free fluid.

Hepatobiliary: Multiple hepatic hypodense lesions measuring up to
3.2 cm in the left lobe of the liver consistent with cysts. A
subcentimeter hypodense lesion in the right lobe of the liver series
2, image 25) is not well characterized but also likely represents a
cyst or hemangioma. There is probable underlying mild fatty
infiltration. No intrahepatic biliary ductal dilatation.
Cholecystectomy.

Pancreas: Unremarkable. No pancreatic ductal dilatation or
surrounding inflammatory changes.

Spleen: Normal in size without focal abnormality.

Adrenals/Urinary Tract: The adrenal glands are unremarkable. 3.5 cm
left renal upper pole cyst. Smaller bilateral renal hypodense
lesions are not well characterized but most consistent with cysts as
seen on the prior lumbar MRI. There is no hydronephrosis on either
side. There is symmetric enhancement and excretion of contrast. The
visualized ureters appear unremarkable. The urinary bladder is
distended.

Stomach/Bowel: Large amount of dense stool noted at the rectal vault
consistent with fecal impaction. There is sigmoid diverticulosis
without active inflammatory changes. Liquid stool noted throughout
the colon. There is no bowel dilatation or evidence of obstruction.
There is a small hiatal hernia. Appendectomy.

Vascular/Lymphatic: Moderate aortoiliac atherosclerotic disease. The
IVC is unremarkable. No portal venous gas. There is no adenopathy.

Reproductive: Hysterectomy.  No pelvic mass.

Other: None

Musculoskeletal: Right hip arthroplasty. No acute osseous pathology.
There is scoliosis with degenerative changes of the spine.
IMPRESSION: 1. Large impacted appearing stool the rectal vault. Liquid stool
throughout the remainder of the colon. No bowel obstruction.
2. Sigmoid diverticulosis.
3. Distended urinary bladder.
4.  Aortic Atherosclerosis ([id number]).

## 2018-10-20 ENCOUNTER — Encounter (HOSPITAL_COMMUNITY): Payer: Self-pay | Admitting: Emergency Medicine

## 2018-10-20 ENCOUNTER — Emergency Department (HOSPITAL_COMMUNITY): Payer: Medicare Other

## 2018-10-20 ENCOUNTER — Other Ambulatory Visit (HOSPITAL_COMMUNITY): Payer: Medicare Other

## 2018-10-20 ENCOUNTER — Other Ambulatory Visit: Payer: Self-pay

## 2018-10-20 ENCOUNTER — Observation Stay (HOSPITAL_COMMUNITY)
Admission: EM | Admit: 2018-10-20 | Discharge: 2018-10-20 | Disposition: A | Discharge status: Home / Self Care | Payer: Medicare Other | Attending: Family Medicine | Admitting: Family Medicine

## 2018-10-20 DIAGNOSIS — R079 Chest pain, unspecified: Secondary | ICD-10-CM | POA: Diagnosis not present

## 2018-10-20 DIAGNOSIS — R1013 Epigastric pain: Secondary | ICD-10-CM | POA: Diagnosis present

## 2018-10-20 DIAGNOSIS — I444 Left anterior fascicular block: Secondary | ICD-10-CM | POA: Insufficient documentation

## 2018-10-20 DIAGNOSIS — Z8673 Personal history of transient ischemic attack (TIA), and cerebral infarction without residual deficits: Secondary | ICD-10-CM | POA: Insufficient documentation

## 2018-10-20 DIAGNOSIS — R9431 Abnormal electrocardiogram [ECG] [EKG]: Secondary | ICD-10-CM | POA: Insufficient documentation

## 2018-10-20 DIAGNOSIS — S81811A Laceration without foreign body, right lower leg, initial encounter: Secondary | ICD-10-CM | POA: Diagnosis not present

## 2018-10-20 DIAGNOSIS — Z888 Allergy status to other drugs, medicaments and biological substances status: Secondary | ICD-10-CM | POA: Diagnosis not present

## 2018-10-20 DIAGNOSIS — E876 Hypokalemia: Secondary | ICD-10-CM | POA: Diagnosis present

## 2018-10-20 DIAGNOSIS — R55 Syncope and collapse: Principal | ICD-10-CM | POA: Diagnosis present

## 2018-10-20 DIAGNOSIS — Z96641 Presence of right artificial hip joint: Secondary | ICD-10-CM | POA: Insufficient documentation

## 2018-10-20 DIAGNOSIS — Z885 Allergy status to narcotic agent status: Secondary | ICD-10-CM | POA: Insufficient documentation

## 2018-10-20 DIAGNOSIS — I10 Essential (primary) hypertension: Secondary | ICD-10-CM | POA: Diagnosis present

## 2018-10-20 DIAGNOSIS — K5730 Diverticulosis of large intestine without perforation or abscess without bleeding: Secondary | ICD-10-CM | POA: Diagnosis not present

## 2018-10-20 DIAGNOSIS — Z8249 Family history of ischemic heart disease and other diseases of the circulatory system: Secondary | ICD-10-CM | POA: Diagnosis not present

## 2018-10-20 DIAGNOSIS — R58 Hemorrhage, not elsewhere classified: Secondary | ICD-10-CM | POA: Diagnosis not present

## 2018-10-20 DIAGNOSIS — E785 Hyperlipidemia, unspecified: Secondary | ICD-10-CM | POA: Diagnosis not present

## 2018-10-20 DIAGNOSIS — Z79899 Other long term (current) drug therapy: Secondary | ICD-10-CM | POA: Diagnosis not present

## 2018-10-20 DIAGNOSIS — Z96652 Presence of left artificial knee joint: Secondary | ICD-10-CM | POA: Insufficient documentation

## 2018-10-20 LAB — COMPREHENSIVE METABOLIC PANEL
ALK PHOS: 63 U/L (ref 38–126)
ALT: 23 U/L (ref 0–44)
AST: 29 U/L (ref 15–41)
Albumin: 3.5 g/dL (ref 3.5–5.0)
Anion gap: 16 — ABNORMAL HIGH (ref 5–15)
BUN: 19 mg/dL (ref 8–23)
CALCIUM: 9.1 mg/dL (ref 8.9–10.3)
CO2: 22 mmol/L (ref 22–32)
Chloride: 96 mmol/L — ABNORMAL LOW (ref 98–111)
Creatinine, Ser: 0.95 mg/dL (ref 0.44–1.00)
GFR, EST NON AFRICAN AMERICAN: 55 mL/min — AB (ref >60)
Glucose, Bld: 126 mg/dL — ABNORMAL HIGH (ref 70–99)
Potassium: 2.8 mmol/L — ABNORMAL LOW (ref 3.5–5.1)
Sodium: 134 mmol/L — ABNORMAL LOW (ref 135–145)
Total Bilirubin: 0.5 mg/dL (ref 0.3–1.2)
Total Protein: 7.2 g/dL (ref 6.5–8.1)

## 2018-10-20 LAB — CBC WITH DIFFERENTIAL/PLATELET
ABS IMMATURE GRANULOCYTES: 0.03 10*3/uL (ref 0.00–0.07)
Basophils Absolute: 0 10*3/uL (ref 0.0–0.1)
Basophils Relative: 0 %
Eosinophils Absolute: 0.1 10*3/uL (ref 0.0–0.5)
Eosinophils Relative: 1 %
HCT: 36.3 % (ref 36.0–46.0)
HEMOGLOBIN: 11.9 g/dL — AB (ref 12.0–15.0)
Immature Granulocytes: 0 %
Lymphocytes Relative: 32 %
Lymphs Abs: 2.9 10*3/uL (ref 0.7–4.0)
MCH: 30.2 pg (ref 26.0–34.0)
MCHC: 32.8 g/dL (ref 30.0–36.0)
MCV: 92.1 fL (ref 80.0–100.0)
Monocytes Absolute: 0.8 10*3/uL (ref 0.1–1.0)
Monocytes Relative: 9 %
NEUTROS ABS: 5.3 10*3/uL (ref 1.7–7.7)
NEUTROS PCT: 58 %
Platelets: 514 10*3/uL — ABNORMAL HIGH (ref 150–400)
RBC: 3.94 MIL/uL (ref 3.87–5.11)
RDW: 13.3 % (ref 11.5–15.5)
WBC: 9.2 10*3/uL (ref 4.0–10.5)
nRBC: 0 % (ref 0.0–0.2)

## 2018-10-20 LAB — POTASSIUM: Potassium: 3.4 mmol/L — ABNORMAL LOW (ref 3.5–5.1)

## 2018-10-20 LAB — LACTIC ACID, PLASMA
Lactic Acid, Venous: 1.6 mmol/L (ref 0.5–1.9)
Lactic Acid, Venous: 1.2 mmol/L (ref 0.5–1.9)

## 2018-10-20 LAB — TROPONIN I
Troponin I: <0.03 ng/mL (ref <0.03)
Troponin I: <0.03 ng/mL (ref <0.03)

## 2018-10-20 LAB — LIPASE, BLOOD: LIPASE: 54 U/L — AB (ref 11–51)

## 2018-10-20 LAB — MAGNESIUM: Magnesium: 1.9 mg/dL (ref 1.7–2.4)

## 2018-10-20 MED ORDER — IOPAMIDOL (ISOVUE-370) INJECTION 76%
100.0000 mL | Freq: Once | INTRAVENOUS | Status: AC | PRN
  Administered 2018-10-20: 100 mL via INTRAVENOUS

## 2018-10-20 MED ORDER — ENOXAPARIN SODIUM 40 MG/0.4ML ~~LOC~~ SOLN
40.0000 mg | SUBCUTANEOUS | Status: DC
  Administered 2018-10-20: 40 mg via SUBCUTANEOUS
  Filled 2018-10-20: qty 0.4

## 2018-10-20 MED ORDER — POTASSIUM CHLORIDE 10 MEQ/100ML IV SOLN
10.0000 meq | INTRAVENOUS | Status: AC
  Administered 2018-10-20: 10 meq via INTRAVENOUS
  Filled 2018-10-20: qty 100

## 2018-10-20 MED ORDER — POTASSIUM CHLORIDE CRYS ER 20 MEQ PO TBCR: 40.0000 meq | EXTENDED_RELEASE_TABLET | Freq: Once | ORAL | Status: DC

## 2018-10-20 MED ORDER — SIMVASTATIN 20 MG PO TABS: 20.0000 mg | ORAL_TABLET | Freq: Every day | ORAL | Status: DC

## 2018-10-20 MED ORDER — PANTOPRAZOLE SODIUM 40 MG PO TBEC: 40.0000 mg | DELAYED_RELEASE_TABLET | Freq: Every day | ORAL | 1 refills | Status: DC

## 2018-10-20 MED ORDER — TRIAMTERENE-HCTZ 37.5-25 MG PO TABS: 1.0000 | ORAL_TABLET | Freq: Every day | ORAL | Status: DC

## 2018-10-20 MED ORDER — SODIUM CHLORIDE 0.9 % IV BOLUS
500.0000 mL | Freq: Once | INTRAVENOUS | Status: AC
  Administered 2018-10-20: 500 mL via INTRAVENOUS

## 2018-10-20 MED ORDER — DOXYCYCLINE HYCLATE 100 MG PO TABS: 100.0000 mg | ORAL_TABLET | Freq: Once | ORAL | Status: DC

## 2018-10-20 MED ORDER — ACETAMINOPHEN 500 MG PO TABS
1000.0000 mg | ORAL_TABLET | Freq: Four times a day (QID) | ORAL | Status: DC | PRN
  Administered 2018-10-20: 1000 mg via ORAL
  Filled 2018-10-20: qty 2

## 2018-10-20 MED ORDER — IOPAMIDOL (ISOVUE-370) INJECTION 76%
INTRAVENOUS | Status: AC
  Filled 2018-10-20: qty 100

## 2018-10-20 MED ORDER — POLYVINYL ALCOHOL 1.4 % OP SOLN
Freq: Four times a day (QID) | OPHTHALMIC | Status: DC
  Administered 2018-10-20: 10:00:00 via OPHTHALMIC
  Filled 2018-10-20: qty 15

## 2018-10-20 MED ORDER — FLUTICASONE PROPIONATE 50 MCG/ACT NA SUSP: 1.0000 | Freq: Every evening | NASAL | Status: DC | PRN

## 2018-10-20 MED ORDER — SODIUM CHLORIDE 0.9% FLUSH
3.0000 mL | Freq: Two times a day (BID) | INTRAVENOUS | Status: DC
  Administered 2018-10-20: 3 mL via INTRAVENOUS

## 2018-10-20 NOTE — Care Management Note (Signed)
Case Management Note  Patient Details  Name: TYNIESHA HOWALD MRN: [id number] Date of Birth: 1930/10/14  Subjective/Objective:                    Action/Plan:  Requested MD to add RN and PT to hh order and specify how to dress legs. Daughter states they have used [facility] in past. Would like to use them again. Referral placed to [facility]. No DME needs. No other CM needs Expected Discharge Date:                  Expected Discharge Plan:  [place]  In-House Referral:     Discharge planning Services  CM Consult  Post Acute Care Choice:  Home Health Choice offered to:  Patient, Adult Children  DME Arranged:    DME Agency:     HH Arranged:  PT, Nurse's Aide, RN [place] Agency:  [facility] (formerly [employer])  Status of Service:  Completed, signed off  If discussed at [employer], dates discussed:    Additional Comments:  Carles Collet, RN 10/20/2018, 4:14 PM

## 2018-10-20 NOTE — ED Triage Notes (Signed)
Patient is from home, started having pain in the epigastric area.  Patient started coughing, which she has been coughing for the last week or so, which she then had a syncopal episode and was lowered to ground by daughter.  She gets dizzy from laying down to sitting.  She is CAOx4 at this time.  She does have a skin tear/laceration to the right leg.

## 2018-10-20 NOTE — ED Notes (Signed)
Patient transported to CT 

## 2018-10-20 NOTE — Discharge Summary (Signed)
Discharge Summary  [name] JSE:[id number] DOB: 1931-06-05  PCP: Lawerance Cruel, MD  Admit date: 10/20/2018 Discharge date: 10/20/2018  Time spent: 33 minutes  Recommendations for Outpatient Follow-up:  1. Primary care provider,  Discharge Diagnoses:  Active Hospital Problems   Diagnosis Date Noted  . Syncope 10/20/2018  . HTN (hypertension) 10/20/2018  . Epigastric abdominal pain of unknown etiology 10/20/2018  . Hypokalemia 07/10/2012    Resolved Hospital Problems  No resolved problems to display.    Discharge Condition: Approved  Diet recommendation: Dr. Elnoria Howard  Vitals:   10/20/18 0942 10/20/18 1209  BP: (!) 130/56 132/66  Pulse: 72 71  Resp:  18  Temp:  98.9 F (37.2 C)  SpO2:  98%    History of present illness:   [name] is a 85 y.o. female with medical history significant of HTN, HLD.  Patient has had cough recently.  Tried delsym tonight which was new.  Woke up this morning with severe epigastric abd pain, new onset.  Had witnessed syncopal episode by daughter.  Hospital Course:  Principal Problem:   Syncope Active Problems:   Hypokalemia   HTN (hypertension)   Epigastric abdominal pain of unknown etiology Patient was admitted for syncopal episode after epigastric severe epigastric pain.  The cause of the epigastric pain was unknown CT scan was negative except for diverticulosis without evidence of diverticulitis. The syncopal episode was thought to be vasovagal due to abdominal pain she has remained pain-free during the hospitalization.  Denies any headache dizziness shortness of breath or chest pain they requested home health as well as other medical equipment that was ordered.  She was started on Protonix and she will continue on Protonix.  EGD was offered but she declined EGD she did not want any invasive procedure that this was discussed at length with her and her daughter who was at bedside.  Her blood pressure was maintained  hydrochlorthiazide was held during the hospital stay but that can be restarted as her blood pressure is still in the increasing.  Her potassium was low at 2.8 and she was replaced with 2 IV recheck shows it to be 3.4 she most likely going to need potassium supplement since she is on hydrochlorothiazide/triamterene.  This will be discussed with her primary care provider to see if she is going to be on long-term potassium.  Procedures:  None  Consultations:  None  Discharge Exam: BP 132/66 (BP Location: Left Arm)   Pulse 71   Temp 98.9 F (37.2 C) (Oral)   Resp 18   SpO2 98%   General: Alert oriented x3 pleasant Cardiovascular: Regular rate and rhythm no murmur Respiratory: Clear to auscultation bilaterally Abdomen: Soft normal active bowel sounds no pulsation no masses.  Slight tenderness to deep palpation in the epigastric area Extremity: No edema  Discharge Instructions You were cared for by a hospitalist during your hospital stay. If you have any questions about your discharge medications or the care you received while you were in the hospital after you are discharged, you can call the unit and asked to speak with the hospitalist on call if the hospitalist that took care of you is not available. Once you are discharged, your primary care physician will handle any further medical issues. Please note that NO REFILLS for any discharge medications will be authorized once you are discharged, as it is imperative that you return to your primary care physician (or establish a relationship with a primary care physician if  you do not have one) for your aftercare needs so that they can reassess your need for medications and monitor your lab values.  Discharge Instructions    Call MD for:  difficulty breathing, headache or visual disturbances   Complete by:  As directed    Call MD for:  persistant nausea and vomiting   Complete by:  As directed    Call MD for:  redness, tenderness, or signs of  infection (pain, swelling, redness, odor or green/yellow discharge around incision site)   Complete by:  As directed    Call MD for:  severe uncontrolled pain   Complete by:  As directed    Diet - low sodium heart healthy   Complete by:  As directed    Discharge instructions   Complete by:  As directed    F/U PCP FOR 2 D ECHO AS OUTPATIENT   Increase activity slowly   Complete by:  As directed      Allergies as of 10/20/2018      Reactions   Oxycodone Other (See Comments)   " my children said I went crazy ! "   Tape Other (See Comments)   Pulls skin off of legs. Tissue paper skin on legs      Medication List    TAKE these medications   acetaminophen 500 MG tablet Commonly known as:  TYLENOL Take 1,000 mg by mouth every 6 (six) hours as needed for mild pain.   fluticasone 50 MCG/ACT nasal spray Commonly known as:  FLONASE Place 1 spray into both nostrils at bedtime as needed for allergies or rhinitis.   pantoprazole 40 MG tablet Commonly known as:  PROTONIX Take 1 tablet (40 mg total) by mouth daily.   simvastatin 20 MG tablet Commonly known as:  ZOCOR Take 20 mg by mouth at bedtime.   SYSTANE BALANCE OP Place 1 drop into both eyes 4 (four) times daily.   triamterene-hydrochlorothiazide 37.5-25 MG tablet Commonly known as:  MAXZIDE-25 Take 1 tablet by mouth daily.      Allergies  Allergen Reactions  . Oxycodone Other (See Comments)    " my children said I went crazy ! "  . Tape Other (See Comments)    Pulls skin off of legs. Tissue paper skin on legs   Follow-up Information    Home[facility] Follow up.   Specialty:  [facility] Contact information: [address] [place] [address] [phone]            The results of significant diagnostics from this hospitalization (including imaging, microbiology, ancillary and laboratory) are listed below for reference.    Significant Diagnostic Studies: Dg Tibia/fibula  Right  Result Date: 10/20/2018 CLINICAL DATA:  Right leg laceration. EXAM: RIGHT TIBIA AND FIBULA - 2 VIEW COMPARISON:  08/12/2018 FINDINGS: Right knee arthroplasty in expected alignment. No periprosthetic or acute fracture of the tibia/fibula. Cortical margins are intact. No periosteal reaction or bony destructive change. A dressing overlies the anterior lower leg. No tracking soft tissue air or radiopaque foreign body. IMPRESSION: No radiopaque foreign body or acute osseous abnormality. Electronically Signed   By: Keith Rake M.D.   On: 10/20/2018 02:00   Dg Abdomen Acute W/chest  Result Date: 10/20/2018 CLINICAL DATA:  Epigastric abdominal pain. EXAM: DG ABDOMEN ACUTE W/ 1V CHEST COMPARISON:  Abdominal CT 10/11/2017 FINDINGS: Heart is normal in size. Normal mediastinal contours. Atherosclerosis of the aortic arch. No pulmonary edema, pleural effusion or focal airspace  disease. Calcified granulomas at the right lung base. No bowel dilatation to suggest obstruction. Small volume of stool and air throughout nondilated colon. Decreased rectal stool burden from prior. No free intra-abdominal air. Cholecystectomy clips in the right upper quadrant. No visualized radiopaque calculi. Scoliotic curvature in the spine. Right hip arthroplasty. Chronic degenerative change of the right shoulder. IMPRESSION: 1. No bowel obstruction or free air. 2. No acute chest findings. Electronically Signed   By: Keith Rake M.D.   On: 10/20/2018 01:59   Ct Angio Chest/abd/pel For Dissection W And/or Wo Contrast  Result Date: 10/20/2018 CLINICAL DATA:  Chest and abdominal pain onset last night. EXAM: CT ANGIOGRAPHY CHEST, ABDOMEN AND PELVIS TECHNIQUE: Multidetector CT imaging through the chest, abdomen and pelvis was performed using the standard protocol during bolus administration of intravenous contrast. Multiplanar reconstructed images and MIPs were obtained and reviewed to evaluate the vascular anatomy. CONTRAST:   111mL ISOVUE-370 IOPAMIDOL (ISOVUE-370) INJECTION 76% COMPARISON:  Chest CT angiogram dated 03/19/2011. CT abdomen dated 10/11/2017. FINDINGS: CTA CHEST FINDINGS Cardiovascular: Scattered calcified atherosclerosis of the normal caliber thoracic aorta. No thoracic aortic aneurysm or evidence of aortic dissection. Some of the most peripheral segmental and subsegmental pulmonary arteries could not be definitively characterized due to patient breathing motion artifact, however, there is no pulmonary embolism identified within the main, lobar or segmental pulmonary arteries bilaterally. Mediastinum/Nodes: No mass or enlarged lymph nodes seen within the mediastinum or perihilar regions. Esophagus is unremarkable. Trachea appears normal. Lungs/Pleura: Small focus of ground-glass opacity at the LEFT lung base, most likely atelectasis. Stable small nodule within the RIGHT lower lobe indicating benignity. Additional scattered calcified granulomas are also stable. No suspicious pulmonary nodule or mass. No pleural effusion or pneumothorax. Musculoskeletal: Degenerative changes throughout the slightly scoliotic thoracic spine, mild to moderate in degree. No acute or suspicious osseous finding. Review of the MIP images confirms the above findings. CTA ABDOMEN AND PELVIS FINDINGS VASCULAR Aorta: Scattered atherosclerotic calcifications throughout the normal caliber abdominal aorta. No abdominal aortic aneurysm or evidence of aortic dissection. Celiac: Diminutive takeoff but otherwise unremarkable. Normal contrast flow within the distal branches of the celiac trunk. SMA: Patent without evidence of aneurysm, dissection, vasculitis or significant stenosis. Renals: Both renal arteries are patent without evidence of aneurysm, dissection, vasculitis, fibromuscular dysplasia or significant stenosis. IMA: Patent without evidence of aneurysm, dissection, vasculitis or significant stenosis. Inflow: Patent without evidence of aneurysm,  dissection, vasculitis or significant stenosis. Veins: No obvious venous abnormality within the limitations of this arterial phase study. Review of the MIP images confirms the above findings. NON-VASCULAR Hepatobiliary: Multiple hepatic cysts, stable. No new/acute liver abnormality is seen. Status post cholecystectomy. No bile duct dilatation seen. Pancreas: Unremarkable. No pancreatic ductal dilatation or surrounding inflammatory changes. Spleen: Unremarkable. Adrenals/Urinary Tract: Adrenal glands appear normal. Again noted are bilateral renal cysts. Kidneys otherwise unremarkable without suspicious mass, stone or hydronephrosis. No perinephric fluid. No ureteral or bladder calculi identified. Bladder is unremarkable. Stomach/Bowel: No dilated large or small bowel loops. No evidence of acute bowel wall inflammation. Scattered diverticulosis without evidence of acute diverticulitis. Appendix is not convincingly seen but there are no inflammatory changes about the cecum to suggest acute appendicitis. Stomach is unremarkable, decompressed. Lymphatic: No enlarged lymph nodes appreciated. Reproductive: Status post hysterectomy. No adnexal masses. Other: No free fluid or abscess collection seen. No free intraperitoneal air. Musculoskeletal: No acute or suspicious osseous finding. Degenerative changes throughout the slightly scoliotic lumbar spine, mild to moderate in degree. Review of the MIP images confirms  the above findings. IMPRESSION: 1. Mild atelectasis at the LEFT lung base, less likely pneumonia or aspiration. 2. Otherwise, no acute findings within the chest, abdomen or pelvis. No aortic aneurysm or dissection. No pulmonary embolism seen, with mild study limitations detailed above. 3. Colonic diverticulosis without evidence of acute diverticulitis. 4. Additional chronic/incidental findings detailed above. Aortic Atherosclerosis (ICD10-I70.0). Electronically Signed   By: Franki Cabot M.D.   On: 10/20/2018 05:09      Microbiology: No results found for this or any previous visit (from the past 240 hour(s)).   Labs: Basic Metabolic Panel: Recent Labs  Lab 10/20/18 0124 10/20/18 0649 10/20/18 1149  NA 134*  --   --   K 2.8*  --  3.4*  CL 96*  --   --   CO2 22  --   --   GLUCOSE 126*  --   --   BUN 19  --   --   CREATININE 0.95  --   --   CALCIUM 9.1  --   --   MG  --  1.9  --    Liver Function Tests: Recent Labs  Lab 10/20/18 0124  AST 29  ALT 23  ALKPHOS 63  BILITOT 0.5  PROT 7.2  ALBUMIN 3.5   Recent Labs  Lab 10/20/18 0124  LIPASE 54*   No results for input(s): AMMONIA in the last 168 hours. CBC: Recent Labs  Lab 10/20/18 0124  WBC 9.2  NEUTROABS 5.3  HGB 11.9*  HCT 36.3  MCV 92.1  PLT 514*   Cardiac Enzymes: Recent Labs  Lab 10/20/18 0124 10/20/18 0459 10/20/18 0649 10/20/18 1149  TROPONINI <0.03 <0.03 <0.03 <0.03   BNP: BNP (last 3 results) No results for input(s): BNP in the last 8760 hours.  ProBNP (last 3 results) No results for input(s): PROBNP in the last 8760 hours.  CBG: No results for input(s): GLUCAP in the last 168 hours.     Signed:  Cristal Deer, MD [facility] 10/20/2018, 6:04 PM

## 2018-10-20 NOTE — H&P (Signed)
History and Physical    [name] URK:[id number] DOB: 03-22-1931 DOA: 10/20/2018  PCP: Lawerance Cruel, MD  Patient coming from: Home  I have personally briefly reviewed patient's old medical records in [place]  Chief Complaint: Abd pain, syncope  HPI: [name] is a 85 y.o. female with medical history significant of HTN, HLD.  Patient has had cough recently.  Tried delsym tonight which was new.  Woke up this morning with severe epigastric abd pain, new onset.  Had witnessed syncopal episode by daughter.  Recovered from episode, brought in to ED.   ED Course: Abd pain has been improving since then and now resolved.  CT aortogram of chest, abd, pelvis is negative for acute findings to explain abd pain, no PE either.  Lipase 54.  H/o cholecystectomy in past.  EKG initially showed some non-speicific T wave changes that were present on prior EKGs.  Trop neg initially.   Review of Systems: As per HPI otherwise 10 point review of systems negative.   Past Medical History:  Diagnosis Date  . Arthritis    "knees, hips, right foot, right hand" (09/01/2015)  . History of blood transfusion 2006   "w/knee OR  . Hyperlipidemia   . Hypertension   . Oral thrush   . Pneumonia    history of  . PONV (postoperative nausea and vomiting)    "just w/knee OR in 2006"  . 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 TIA (transient ischemic attack)    per ECHO 2008    Past Surgical History:  Procedure Laterality Date  . [place] VITRECTOMY WITH 20 GAUGE MVR PORT Left 09/01/2015  . 25 GAUGE PARS PLANA VITRECTOMY WITH 20 GAUGE MVR PORT Left 09/01/2015   Procedure: 25 GAUGE PARS PLANA VITRECTOMY WITH 20 GAUGE MVR PORT;  Surgeon: Hayden Pedro, MD;  Location: [facility];  Service: Ophthalmology;  Laterality: Left;  . AIR/FLUID EXCHANGE Left 09/01/2015   Procedure: AIR/FLUID EXCHANGE;  Surgeon: Hayden Pedro, MD;  Location: [facility];  Service: Ophthalmology;   Laterality: Left;  . APPENDECTOMY  1970  . BREAST CYST EXCISION Bilateral 1960's  . CATARACT EXTRACTION W/ INTRAOCULAR LENS  IMPLANT, BILATERAL  2005  . EYE SURGERY    . JOINT REPLACEMENT    . LAPAROSCOPIC CHOLECYSTECTOMY  2000's  . LASER PHOTO ABLATION Left 09/01/2015   Procedure: LASER PHOTO ABLATION;  Surgeon: Hayden Pedro, MD;  Location: [facility];  Service: Ophthalmology;  Laterality: Left;  Endo laser  . MEMBRANE PEEL Left 09/01/2015   Procedure: MEMBRANE PEEL;  Surgeon: Hayden Pedro, MD;  Location: [facility];  Service: Ophthalmology;  Laterality: Left;  . MOHS SURGERY Left ~ 2010   lower lip  . RETINAL DETACHMENT SURGERY Left 2011  . TOTAL HIP ARTHROPLASTY  07/09/2012   Procedure: TOTAL HIP ARTHROPLASTY;  Surgeon: Gearlean Alf, MD;  Location: [facility];  Service: Orthopedics;  Laterality: Right;  . TOTAL KNEE ARTHROPLASTY  2006  . TOTAL KNEE ARTHROPLASTY Left 10/02/2017   Procedure: LEFT TOTAL KNEE ARTHROPLASTY;  Surgeon: Gaynelle Arabian, MD;  Location: [facility];  Service: Orthopedics;  Laterality: Left;  Adductor Block  . TUBAL LIGATION    . VAGINAL HYSTERECTOMY  1984     reports that she has never smoked. She has never used smokeless tobacco. She reports that she does not drink alcohol or use drugs.  Allergies  Allergen Reactions  . Oxycodone Other (See  Comments)    " my children said I went crazy ! "  . Tape Other (See Comments)    Pulls skin off of legs. Tissue paper skin on legs    Family History  Problem Relation Age of Onset  . Cancer Father   . Heart attack Mother      Prior to Admission medications   Medication Sig Start Date End Date Taking? Authorizing Provider  acetaminophen (TYLENOL) 500 MG tablet Take 1,000 mg by mouth every 6 (six) hours as needed for mild pain.   Yes [provider]  fluticasone (FLONASE) 50 MCG/ACT nasal spray Place 1 spray into both nostrils at bedtime as needed for allergies or rhinitis.   Yes [provider]  Propylene  Glycol (SYSTANE BALANCE OP) Place 1 drop into both eyes 4 (four) times daily.    Yes [provider]  simvastatin (ZOCOR) 20 MG tablet Take 20 mg by mouth at bedtime.    Yes [provider]  triamterene-hydrochlorothiazide (MAXZIDE-25) 37.5-25 MG tablet Take 1 tablet by mouth daily.   Yes [provider]    Physical Exam: Vitals:   10/20/18 0500 10/20/18 0515 10/20/18 0530 10/20/18 0545  BP: 129/64 (!) 117/58 140/73 131/61  Pulse: 86 77 83 73  Resp: 18 18 17 14   SpO2: 100% 96% 100% 98%    Constitutional: NAD, calm, comfortable Eyes: PERRL, lids and conjunctivae normal ENMT: Mucous membranes are moist. Posterior pharynx clear of any exudate or lesions.Normal dentition.  Neck: normal, supple, no masses, no thyromegaly Respiratory: clear to auscultation bilaterally, no wheezing, no crackles. Normal respiratory effort. No accessory muscle use.  Cardiovascular: Regular rate and rhythm, no murmurs / rubs / gallops. No extremity edema. 2+ pedal pulses. No carotid bruits.  Abdomen: Mild Epigastric TTP, no guarding nor rebound Musculoskeletal: no clubbing / cyanosis. No joint deformity upper and lower extremities. Good ROM, no contractures. Normal muscle tone.  Skin: no rashes, lesions, ulcers. No induration Neurologic: CN 2-12 grossly intact. Sensation intact, DTR normal. Strength 5/5 in all 4.  Psychiatric: Normal judgment and insight. Alert and oriented x 3. Normal mood.    Labs on Admission: I have personally reviewed following labs and imaging studies  CBC: Recent Labs  Lab 10/20/18 0124  WBC 9.2  NEUTROABS 5.3  HGB 11.9*  HCT 36.3  MCV 92.1  PLT 423*   Basic Metabolic Panel: Recent Labs  Lab 10/20/18 0124  NA 134*  K 2.8*  CL 96*  CO2 22  GLUCOSE 126*  BUN 19  CREATININE 0.95  CALCIUM 9.1   GFR: CrCl cannot be calculated (Unknown ideal weight.). Liver Function Tests: Recent Labs  Lab 10/20/18 0124  AST 29  ALT 23  ALKPHOS 63    BILITOT 0.5  PROT 7.2  ALBUMIN 3.5   Recent Labs  Lab 10/20/18 0124  LIPASE 54*   No results for input(s): AMMONIA in the last 168 hours. Coagulation Profile: No results for input(s): INR, PROTIME in the last 168 hours. Cardiac Enzymes: Recent Labs  Lab 10/20/18 0124  TROPONINI <0.03   BNP (last 3 results) No results for input(s): PROBNP in the last 8760 hours. HbA1C: No results for input(s): HGBA1C in the last 72 hours. CBG: No results for input(s): GLUCAP in the last 168 hours. Lipid Profile: No results for input(s): CHOL, HDL, LDLCALC, TRIG, CHOLHDL, LDLDIRECT in the last 72 hours. Thyroid Function Tests: No results for input(s): TSH, T4TOTAL, FREET4, T3FREE, THYROIDAB in the last 72 hours. Anemia Panel:  No results for input(s): VITAMINB12, FOLATE, FERRITIN, TIBC, IRON, RETICCTPCT in the last 72 hours. Urine analysis:    Component Value Date/Time   COLORURINE YELLOW 10/12/2017 0119   APPEARANCEUR CLEAR 10/12/2017 0119   LABSPEC 1.016 10/12/2017 0119   PHURINE 7.0 10/12/2017 0119   GLUCOSEU NEGATIVE 10/12/2017 0119   HGBUR SMALL (A) 10/12/2017 0119   BILIRUBINUR NEGATIVE 10/12/2017 0119   KETONESUR NEGATIVE 10/12/2017 0119   PROTEINUR NEGATIVE 10/12/2017 0119   UROBILINOGEN 1.0 07/12/2012 0700   NITRITE NEGATIVE 10/12/2017 0119   LEUKOCYTESUR NEGATIVE 10/12/2017 0119    Radiological Exams on Admission: Dg Tibia/fibula Right  Result Date: 10/20/2018 CLINICAL DATA:  Right leg laceration. EXAM: RIGHT TIBIA AND FIBULA - 2 VIEW COMPARISON:  08/12/2018 FINDINGS: Right knee arthroplasty in expected alignment. No periprosthetic or acute fracture of the tibia/fibula. Cortical margins are intact. No periosteal reaction or bony destructive change. A dressing overlies the anterior lower leg. No tracking soft tissue air or radiopaque foreign body. IMPRESSION: No radiopaque foreign body or acute osseous abnormality. Electronically Signed   By: Keith Rake M.D.   On:  10/20/2018 02:00   Dg Abdomen Acute W/chest  Result Date: 10/20/2018 CLINICAL DATA:  Epigastric abdominal pain. EXAM: DG ABDOMEN ACUTE W/ 1V CHEST COMPARISON:  Abdominal CT 10/11/2017 FINDINGS: Heart is normal in size. Normal mediastinal contours. Atherosclerosis of the aortic arch. No pulmonary edema, pleural effusion or focal airspace disease. Calcified granulomas at the right lung base. No bowel dilatation to suggest obstruction. Small volume of stool and air throughout nondilated colon. Decreased rectal stool burden from prior. No free intra-abdominal air. Cholecystectomy clips in the right upper quadrant. No visualized radiopaque calculi. Scoliotic curvature in the spine. Right hip arthroplasty. Chronic degenerative change of the right shoulder. IMPRESSION: 1. No bowel obstruction or free air. 2. No acute chest findings. Electronically Signed   By: Keith Rake M.D.   On: 10/20/2018 01:59   Ct Angio Chest/abd/pel For Dissection W And/or Wo Contrast  Result Date: 10/20/2018 CLINICAL DATA:  Chest and abdominal pain onset last night. EXAM: CT ANGIOGRAPHY CHEST, ABDOMEN AND PELVIS TECHNIQUE: Multidetector CT imaging through the chest, abdomen and pelvis was performed using the standard protocol during bolus administration of intravenous contrast. Multiplanar reconstructed images and MIPs were obtained and reviewed to evaluate the vascular anatomy. CONTRAST:  123mL ISOVUE-370 IOPAMIDOL (ISOVUE-370) INJECTION 76% COMPARISON:  Chest CT angiogram dated 03/19/2011. CT abdomen dated 10/11/2017. FINDINGS: CTA CHEST FINDINGS Cardiovascular: Scattered calcified atherosclerosis of the normal caliber thoracic aorta. No thoracic aortic aneurysm or evidence of aortic dissection. Some of the most peripheral segmental and subsegmental pulmonary arteries could not be definitively characterized due to patient breathing motion artifact, however, there is no pulmonary embolism identified within the main, lobar or  segmental pulmonary arteries bilaterally. Mediastinum/Nodes: No mass or enlarged lymph nodes seen within the mediastinum or perihilar regions. Esophagus is unremarkable. Trachea appears normal. Lungs/Pleura: Small focus of ground-glass opacity at the LEFT lung base, most likely atelectasis. Stable small nodule within the RIGHT lower lobe indicating benignity. Additional scattered calcified granulomas are also stable. No suspicious pulmonary nodule or mass. No pleural effusion or pneumothorax. Musculoskeletal: Degenerative changes throughout the slightly scoliotic thoracic spine, mild to moderate in degree. No acute or suspicious osseous finding. Review of the MIP images confirms the above findings. CTA ABDOMEN AND PELVIS FINDINGS VASCULAR Aorta: Scattered atherosclerotic calcifications throughout the normal caliber abdominal aorta. No abdominal aortic aneurysm or evidence of aortic dissection. Celiac: Diminutive takeoff but otherwise unremarkable. Normal  contrast flow within the distal branches of the celiac trunk. SMA: Patent without evidence of aneurysm, dissection, vasculitis or significant stenosis. Renals: Both renal arteries are patent without evidence of aneurysm, dissection, vasculitis, fibromuscular dysplasia or significant stenosis. IMA: Patent without evidence of aneurysm, dissection, vasculitis or significant stenosis. Inflow: Patent without evidence of aneurysm, dissection, vasculitis or significant stenosis. Veins: No obvious venous abnormality within the limitations of this arterial phase study. Review of the MIP images confirms the above findings. NON-VASCULAR Hepatobiliary: Multiple hepatic cysts, stable. No new/acute liver abnormality is seen. Status post cholecystectomy. No bile duct dilatation seen. Pancreas: Unremarkable. No pancreatic ductal dilatation or surrounding inflammatory changes. Spleen: Unremarkable. Adrenals/Urinary Tract: Adrenal glands appear normal. Again noted are bilateral renal  cysts. Kidneys otherwise unremarkable without suspicious mass, stone or hydronephrosis. No perinephric fluid. No ureteral or bladder calculi identified. Bladder is unremarkable. Stomach/Bowel: No dilated large or small bowel loops. No evidence of acute bowel wall inflammation. Scattered diverticulosis without evidence of acute diverticulitis. Appendix is not convincingly seen but there are no inflammatory changes about the cecum to suggest acute appendicitis. Stomach is unremarkable, decompressed. Lymphatic: No enlarged lymph nodes appreciated. Reproductive: Status post hysterectomy. No adnexal masses. Other: No free fluid or abscess collection seen. No free intraperitoneal air. Musculoskeletal: No acute or suspicious osseous finding. Degenerative changes throughout the slightly scoliotic lumbar spine, mild to moderate in degree. Review of the MIP images confirms the above findings. IMPRESSION: 1. Mild atelectasis at the LEFT lung base, less likely pneumonia or aspiration. 2. Otherwise, no acute findings within the chest, abdomen or pelvis. No aortic aneurysm or dissection. No pulmonary embolism seen, with mild study limitations detailed above. 3. Colonic diverticulosis without evidence of acute diverticulitis. 4. Additional chronic/incidental findings detailed above. Aortic Atherosclerosis (ICD10-I70.0). Electronically Signed   By: Franki Cabot M.D.   On: 10/20/2018 05:09    EKG: Independently reviewed.  Assessment/Plan Principal Problem:   Syncope Active Problems:   Hypokalemia   HTN (hypertension)   Epigastric abdominal pain of unknown etiology    1. Syncope - 1. Suspect the syncope was vasovagal from the abd pain 2. Syncope pathway 3. Tele monitor 4. 2d echo 5. Check Mg 2. Epigastric abd pain - 1. Not sure what cause the now resolving / resolved abd pain. 2. Obs patient 3. Serial trops, tele monitor 4. Consider empiric PPI in case this was ulcer / GERD? 5. Patient and family  understandably not interested in invasive testing for the now resolving abd pain (ie EGD). 3. Hypokalemia - replace K 4. HTN - 1. Holding triamterene / HCTZ for the moment  DVT prophylaxis: Lovenox Code Status: Full Family Communication: Daughter at bedside Disposition Plan: Home after admit Consults called: None Admission status: Place in [place], [place] [facility] Pager [phone] Only works nights!  If 7AM-7PM, please contact the primary day team physician taking care of patient  [email or web address] Password [facility]  10/20/2018, 5:59 AM

## 2018-10-20 NOTE — ED Provider Notes (Signed)
[facility] EMERGENCY DEPARTMENT Provider Note   CSN: [id number] Arrival date & time: 10/20/18  0059     History   Chief Complaint Chief Complaint  Patient presents with  . Loss of Consciousness  . Cough    HPI [name] is a 85 y.o. female.  Patient presents via EMS with severe epigastric pain and syncopal episode tonight.  States she was lying in bed and began to have severe epigastric pain.  Her daughter heard her "talking to herself" and came into the room.  Patient sat up in bed and then apparently had a syncopal episode where she fell forward but her daughter caught her and she did not strike her head or lose consciousness.  She complains of severe epigastric pain that is persistent.  She is never had this kind of pain before.  She denies any nausea, vomiting, diarrhea.  No chest pain or shortness of breath.  Previous cholecystectomy and appendectomy.  When she fell from the bed she sustained a skin tear to her right shin and has a large skin tear there.  She denies hitting her head.  No neck or back pain.  Denies any cardiac history or GI history. She declines tetanus shot.  The history is provided by the patient, the EMS personnel and a relative.  Loss of Consciousness  Associated symptoms: nausea   Associated symptoms: no chest pain, no dizziness, no headaches, no vomiting and no weakness   Cough  Associated symptoms: no chest pain, no headaches and no myalgias     Past Medical History:  Diagnosis Date  . Arthritis    "knees, hips, right foot, right hand" (09/01/2015)  . History of blood transfusion 2006   "w/knee OR  . Hyperlipidemia   . Hypertension   . Oral thrush   . Pneumonia    history of  . PONV (postoperative nausea and vomiting)    "just w/knee OR in 2006"  . 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 TIA (transient ischemic attack)    per ECHO 2008    Patient Active Problem List   Diagnosis Date Noted  . OA  (osteoarthritis) of knee 10/02/2017  . Preretinal fibrosis 09/01/2015  . Preretinal fibrosis, left eye 08/06/2015  . Aspiration pneumonia ([place]) 07/13/2012  . Postop Altered mental state 07/12/2012  . Postop Acute blood loss anemia 07/10/2012  . Postop Hyponatremia 07/10/2012  . Postop Hypokalemia 07/10/2012  . OA (osteoarthritis) of hip 07/09/2012    Past Surgical History:  Procedure Laterality Date  . [place] VITRECTOMY WITH 20 GAUGE MVR PORT Left 09/01/2015  . 25 GAUGE PARS PLANA VITRECTOMY WITH 20 GAUGE MVR PORT Left 09/01/2015   Procedure: 25 GAUGE PARS PLANA VITRECTOMY WITH 20 GAUGE MVR PORT;  Surgeon: Hayden Pedro, MD;  Location: [facility];  Service: Ophthalmology;  Laterality: Left;  . AIR/FLUID EXCHANGE Left 09/01/2015   Procedure: AIR/FLUID EXCHANGE;  Surgeon: Hayden Pedro, MD;  Location: [facility];  Service: Ophthalmology;  Laterality: Left;  . APPENDECTOMY  1970  . BREAST CYST EXCISION Bilateral 1960's  . CATARACT EXTRACTION W/ INTRAOCULAR LENS  IMPLANT, BILATERAL  2005  . EYE SURGERY    . JOINT REPLACEMENT    . LAPAROSCOPIC CHOLECYSTECTOMY  2000's  . LASER PHOTO ABLATION Left 09/01/2015   Procedure: LASER PHOTO ABLATION;  Surgeon: Hayden Pedro, MD;  Location: [facility];  Service: Ophthalmology;  Laterality: Left;  Endo laser  . MEMBRANE  PEEL Left 09/01/2015   Procedure: MEMBRANE PEEL;  Surgeon: Hayden Pedro, MD;  Location: [facility];  Service: Ophthalmology;  Laterality: Left;  . MOHS SURGERY Left ~ 2010   lower lip  . RETINAL DETACHMENT SURGERY Left 2011  . TOTAL HIP ARTHROPLASTY  07/09/2012   Procedure: TOTAL HIP ARTHROPLASTY;  Surgeon: Gearlean Alf, MD;  Location: [facility];  Service: Orthopedics;  Laterality: Right;  . TOTAL KNEE ARTHROPLASTY  2006  . TOTAL KNEE ARTHROPLASTY Left 10/02/2017   Procedure: LEFT TOTAL KNEE ARTHROPLASTY;  Surgeon: Gaynelle Arabian, MD;  Location: [facility];  Service: Orthopedics;  Laterality: Left;  Adductor Block  . TUBAL LIGATION    .  VAGINAL HYSTERECTOMY  1984     OB History    Gravida      Para      Term      Preterm      AB      Living  3     SAB      TAB      Ectopic      Multiple      Live Births               Home Medications    Prior to Admission medications   Medication Sig Start Date End Date Taking? Authorizing Provider  acetaminophen (TYLENOL) 500 MG tablet Take 1,000 mg by mouth every 6 (six) hours as needed for mild pain.    [provider]  bacitracin-polymyxin b (POLYSPORIN) ophthalmic ointment Place 1 application into the left eye 3 (three) times daily. apply to eye every 12 hours while awake Patient not taking: Reported on 09/15/2017 09/02/15   Hayden Pedro, MD  fluticasone [facility]) 50 MCG/ACT nasal spray Place 1 spray into both nostrils at bedtime as needed for allergies or rhinitis.    [provider]  gatifloxacin (ZYMAXID) 0.5 % SOLN Place 1 drop into the left eye 4 (four) times daily. Patient not taking: Reported on 04/26/2017 09/02/15   Hayden Pedro, MD  HYDROcodone-acetaminophen (NORCO/VICODIN) 5-325 MG tablet Take 1 tablet by mouth every 6 (six) hours as needed for moderate pain.  10/06/17   [provider]  iron polysaccharides (NIFEREX) 150 MG capsule Take 1 capsule (150 mg total) by mouth 2 (two) times daily. 10/05/17   Perkins, Alexzandrew L, PA-C  methocarbamol (ROBAXIN) 500 MG tablet Take 1 tablet (500 mg total) by mouth every 6 (six) hours as needed for muscle spasms. 10/05/17   Perkins, Alexzandrew L, PA-C  potassium chloride (K-DUR) 10 MEQ tablet Take 1 tablet (10 mEq total) by mouth daily. 10/12/17   Little, Wenda Overland, MD  prednisoLONE acetate (PRED FORTE) 1 % ophthalmic suspension Place 1 drop into the left eye 4 (four) times daily. Patient not taking: Reported on 09/15/2017 09/02/15   Hayden Pedro, MD  Propylene Glycol (SYSTANE BALANCE OP) Place 1 drop into both eyes 4 (four) times daily.     [provider]    rivaroxaban (XARELTO) 10 MG TABS tablet Take 1 tablet (10 mg total) by mouth daily with breakfast. Take Xarelto for two and a half more weeks following discharge from the hospital, then discontinue Xarelto. Once the patient has completed the Xarelto, they may resume the 81 mg Aspirin. 10/05/17   Perkins, Alexzandrew L, PA-C  simvastatin (ZOCOR) 20 MG tablet Take 20 mg by mouth at bedtime.     [provider]  traMADol (ULTRAM) 50 MG tablet Take 1-2 tablets (50-100  mg total) by mouth every 6 (six) hours as needed (mild pain). Patient not taking: Reported on 10/11/2017 10/05/17   Dara Lords, Alexzandrew L, PA-C  triamterene-hydrochlorothiazide (MAXZIDE-25) 37.5-25 MG tablet Take 1 tablet by mouth daily.    [provider]    Family History Family History  Problem Relation Age of Onset  . Cancer Father   . Heart attack Mother     Social History Social History   Tobacco Use  . Smoking status: Never Smoker  . Smokeless tobacco: Never Used  Substance Use Topics  . Alcohol use: No  . Drug use: No     Allergies   Oxycodone and Tape   Review of Systems Review of Systems  Constitutional: Positive for activity change and appetite change.  HENT: Negative for congestion.   Respiratory: Positive for cough. Negative for chest tightness.   Cardiovascular: Positive for syncope. Negative for chest pain.  Gastrointestinal: Positive for abdominal pain and nausea. Negative for vomiting.  Genitourinary: Negative for dysuria, vaginal bleeding and vaginal discharge.  Musculoskeletal: Negative for arthralgias and myalgias.  Skin: Negative for pallor.  Neurological: Positive for syncope. Negative for dizziness, weakness and headaches.   all other systems are negative except as noted in the HPI and PMH.     Physical Exam Updated Vital Signs BP 113/71 (BP Location: Left Arm)   Pulse 73   Temp 98 F (36.7 C) (Oral)   Resp 18   SpO2 100%   Physical Exam Vitals signs and  nursing note reviewed.  Constitutional:      General: She is not in acute distress.    Appearance: She is well-developed.  HENT:     Head: Normocephalic and atraumatic.     Mouth/Throat:     Pharynx: No oropharyngeal exudate.  Eyes:     Conjunctiva/sclera: Conjunctivae normal.     Pupils: Pupils are equal, round, and reactive to light.  Neck:     Musculoskeletal: Normal range of motion and neck supple.     Comments: No meningismus. Cardiovascular:     Rate and Rhythm: Normal rate and regular rhythm.     Heart sounds: Normal heart sounds. No murmur.  Pulmonary:     Effort: Pulmonary effort is normal. No respiratory distress.     Breath sounds: Normal breath sounds.  Abdominal:     Palpations: Abdomen is soft.     Tenderness: There is abdominal tenderness. There is no guarding or rebound.     Comments: Epigastric pain with voluntary guarding, no right upper quadrant tenderness, no lower abdominal tenderness  Equal femoral pulses  Musculoskeletal: Normal range of motion.        General: No tenderness.     Comments: Large skin tear to right shin without bony tenderness  Skin:    General: Skin is warm.  Neurological:     Mental Status: She is alert and oriented to person, place, and time.     Cranial Nerves: No cranial nerve deficit.     Motor: No abnormal muscle tone.     Coordination: Coordination normal.     Comments: No ataxia on finger to nose bilaterally. No pronator drift. 5/5 strength throughout. CN 2-12 intact.Equal grip strength. Sensation intact.   Psychiatric:        Behavior: Behavior normal.      ED Treatments / Results  Labs (all labs ordered are listed, but only abnormal results are displayed) Labs Reviewed  CBC WITH DIFFERENTIAL/PLATELET - Abnormal; Notable for the following components:  Result Value   Hemoglobin 11.9 (*)    Platelets 514 (*)    All other components within normal limits  COMPREHENSIVE METABOLIC PANEL - Abnormal; Notable for the  following components:   Sodium 134 (*)    Potassium 2.8 (*)    Chloride 96 (*)    Glucose, Bld 126 (*)    GFR calc non Af Amer 55 (*)    Anion gap 16 (*)    All other components within normal limits  LIPASE, BLOOD - Abnormal; Notable for the following components:   Lipase 54 (*)    All other components within normal limits  TROPONIN I  LACTIC ACID, PLASMA  LACTIC ACID, PLASMA  TROPONIN I  MAGNESIUM  TROPONIN I  TROPONIN I  TROPONIN I    EKG EKG Interpretation  Date/Time:  Saturday October 20 2018 01:08:57 EST Ventricular Rate:  72 PR Interval:    QRS Duration: 99 QT Interval:  454 QTC Calculation: 497 R Axis:   -58 Text Interpretation:  Sinus rhythm Left anterior fascicular block Abnormal R-wave progression, early transition Nonspecific T abnrm, anterolateral leads Borderline prolonged QT interval nonspecific T wave changes  Confirmed by Ezequiel Essex [id number]) on 10/20/2018 1:18:53 AM   Radiology Dg Tibia/fibula Right  Result Date: 10/20/2018 CLINICAL DATA:  Right leg laceration. EXAM: RIGHT TIBIA AND FIBULA - 2 VIEW COMPARISON:  08/12/2018 FINDINGS: Right knee arthroplasty in expected alignment. No periprosthetic or acute fracture of the tibia/fibula. Cortical margins are intact. No periosteal reaction or bony destructive change. A dressing overlies the anterior lower leg. No tracking soft tissue air or radiopaque foreign body. IMPRESSION: No radiopaque foreign body or acute osseous abnormality. Electronically Signed   By: Keith Rake M.D.   On: 10/20/2018 02:00   Dg Abdomen Acute W/chest  Result Date: 10/20/2018 CLINICAL DATA:  Epigastric abdominal pain. EXAM: DG ABDOMEN ACUTE W/ 1V CHEST COMPARISON:  Abdominal CT 10/11/2017 FINDINGS: Heart is normal in size. Normal mediastinal contours. Atherosclerosis of the aortic arch. No pulmonary edema, pleural effusion or focal airspace disease. Calcified granulomas at the right lung base. No bowel dilatation to suggest  obstruction. Small volume of stool and air throughout nondilated colon. Decreased rectal stool burden from prior. No free intra-abdominal air. Cholecystectomy clips in the right upper quadrant. No visualized radiopaque calculi. Scoliotic curvature in the spine. Right hip arthroplasty. Chronic degenerative change of the right shoulder. IMPRESSION: 1. No bowel obstruction or free air. 2. No acute chest findings. Electronically Signed   By: Keith Rake M.D.   On: 10/20/2018 01:59   Ct Angio Chest/abd/pel For Dissection W And/or Wo Contrast  Result Date: 10/20/2018 CLINICAL DATA:  Chest and abdominal pain onset last night. EXAM: CT ANGIOGRAPHY CHEST, ABDOMEN AND PELVIS TECHNIQUE: Multidetector CT imaging through the chest, abdomen and pelvis was performed using the standard protocol during bolus administration of intravenous contrast. Multiplanar reconstructed images and MIPs were obtained and reviewed to evaluate the vascular anatomy. CONTRAST:  144mL ISOVUE-370 IOPAMIDOL (ISOVUE-370) INJECTION 76% COMPARISON:  Chest CT angiogram dated 03/19/2011. CT abdomen dated 10/11/2017. FINDINGS: CTA CHEST FINDINGS Cardiovascular: Scattered calcified atherosclerosis of the normal caliber thoracic aorta. No thoracic aortic aneurysm or evidence of aortic dissection. Some of the most peripheral segmental and subsegmental pulmonary arteries could not be definitively characterized due to patient breathing motion artifact, however, there is no pulmonary embolism identified within the main, lobar or segmental pulmonary arteries bilaterally. Mediastinum/Nodes: No mass or enlarged lymph nodes seen within the mediastinum or perihilar regions. Esophagus  is unremarkable. Trachea appears normal. Lungs/Pleura: Small focus of ground-glass opacity at the LEFT lung base, most likely atelectasis. Stable small nodule within the RIGHT lower lobe indicating benignity. Additional scattered calcified granulomas are also stable. No suspicious  pulmonary nodule or mass. No pleural effusion or pneumothorax. Musculoskeletal: Degenerative changes throughout the slightly scoliotic thoracic spine, mild to moderate in degree. No acute or suspicious osseous finding. Review of the MIP images confirms the above findings. CTA ABDOMEN AND PELVIS FINDINGS VASCULAR Aorta: Scattered atherosclerotic calcifications throughout the normal caliber abdominal aorta. No abdominal aortic aneurysm or evidence of aortic dissection. Celiac: Diminutive takeoff but otherwise unremarkable. Normal contrast flow within the distal branches of the celiac trunk. SMA: Patent without evidence of aneurysm, dissection, vasculitis or significant stenosis. Renals: Both renal arteries are patent without evidence of aneurysm, dissection, vasculitis, fibromuscular dysplasia or significant stenosis. IMA: Patent without evidence of aneurysm, dissection, vasculitis or significant stenosis. Inflow: Patent without evidence of aneurysm, dissection, vasculitis or significant stenosis. Veins: No obvious venous abnormality within the limitations of this arterial phase study. Review of the MIP images confirms the above findings. NON-VASCULAR Hepatobiliary: Multiple hepatic cysts, stable. No new/acute liver abnormality is seen. Status post cholecystectomy. No bile duct dilatation seen. Pancreas: Unremarkable. No pancreatic ductal dilatation or surrounding inflammatory changes. Spleen: Unremarkable. Adrenals/Urinary Tract: Adrenal glands appear normal. Again noted are bilateral renal cysts. Kidneys otherwise unremarkable without suspicious mass, stone or hydronephrosis. No perinephric fluid. No ureteral or bladder calculi identified. Bladder is unremarkable. Stomach/Bowel: No dilated large or small bowel loops. No evidence of acute bowel wall inflammation. Scattered diverticulosis without evidence of acute diverticulitis. Appendix is not convincingly seen but there are no inflammatory changes about the cecum  to suggest acute appendicitis. Stomach is unremarkable, decompressed. Lymphatic: No enlarged lymph nodes appreciated. Reproductive: Status post hysterectomy. No adnexal masses. Other: No free fluid or abscess collection seen. No free intraperitoneal air. Musculoskeletal: No acute or suspicious osseous finding. Degenerative changes throughout the slightly scoliotic lumbar spine, mild to moderate in degree. Review of the MIP images confirms the above findings. IMPRESSION: 1. Mild atelectasis at the LEFT lung base, less likely pneumonia or aspiration. 2. Otherwise, no acute findings within the chest, abdomen or pelvis. No aortic aneurysm or dissection. No pulmonary embolism seen, with mild study limitations detailed above. 3. Colonic diverticulosis without evidence of acute diverticulitis. 4. Additional chronic/incidental findings detailed above. Aortic Atherosclerosis (ICD10-I70.0). Electronically Signed   By: Franki Cabot M.D.   On: 10/20/2018 05:09    Procedures Procedures (including critical care time)  Medications Ordered in ED Medications  potassium chloride 10 mEq in 100 mL IVPB (0 mEq Intravenous Stopped 10/20/18 0625)  iopamidol (ISOVUE-370) 76 % injection (has no administration in time range)  simvastatin (ZOCOR) tablet 20 mg (has no administration in time range)  acetaminophen (TYLENOL) tablet 1,000 mg (has no administration in time range)  fluticasone (FLONASE) 50 MCG/ACT nasal spray 1 spray (has no administration in time range)  polyvinyl alcohol (LIQUIFILM TEARS) 1.4 % ophthalmic solution (has no administration in time range)  potassium chloride SA (K-DUR,KLOR-CON) CR tablet 40 mEq (has no administration in time range)  sodium chloride flush (NS) 0.9 % injection 3 mL (has no administration in time range)  enoxaparin (LOVENOX) injection 40 mg (has no administration in time range)  sodium chloride 0.9 % bolus 500 mL (0 mLs Intravenous Stopped 10/20/18 0526)  iopamidol (ISOVUE-370) 76 %  injection 100 mL (100 mLs Intravenous Contrast Given 10/20/18 0431)     Initial  Impression / Assessment and Plan / ED Course  I have reviewed the triage vital signs and the nursing notes.  Pertinent labs & imaging results that were available during my care of the patient were reviewed by me and considered in my medical decision making (see chart for details).    Epigastric pain with syncopal episode.  EKG without acute ischemia.  Nonspecific T wave inversions in V1 and V2.  Labs with mild hypokalemia.  LFTs and lipase normal.  Troponin negative and EKG unchanged.  Chest x-ray negative.  CT scan obtained and shows no evidence of aortic dissection or aneurysm.  No pulmonary embolism.  Unclear source of patient's epigastric pain.  Consider GERD or acid reflux related.  We will treat for possible pneumonia given patient's cough and possible pneumonia on CT scan.  Suspect her syncope was due to pain.  Unclear source of her epigastric pain.  Will need MI rule out.  She is feeling better at this time and her pain is improved.  Admission discussed with Dr. Alcario Drought.  Final Clinical Impressions(s) / ED Diagnoses   Final diagnoses:  Epigastric pain  Syncope, unspecified syncope type    ED Discharge Orders    None       Maegan Buller, Annie Main, MD 10/20/18 [phone]

## 2018-10-22 DIAGNOSIS — R55 Syncope and collapse: Secondary | ICD-10-CM | POA: Diagnosis not present

## 2018-10-22 DIAGNOSIS — Z09 Encounter for follow-up examination after completed treatment for conditions other than malignant neoplasm: Secondary | ICD-10-CM | POA: Diagnosis not present

## 2018-10-22 DIAGNOSIS — S81801A Unspecified open wound, right lower leg, initial encounter: Secondary | ICD-10-CM | POA: Diagnosis not present

## 2018-10-23 DIAGNOSIS — H35372 Puckering of macula, left eye: Secondary | ICD-10-CM | POA: Diagnosis not present

## 2018-10-23 DIAGNOSIS — E785 Hyperlipidemia, unspecified: Secondary | ICD-10-CM | POA: Diagnosis not present

## 2018-10-23 DIAGNOSIS — I10 Essential (primary) hypertension: Secondary | ICD-10-CM | POA: Diagnosis not present

## 2018-10-23 DIAGNOSIS — S81811D Laceration without foreign body, right lower leg, subsequent encounter: Secondary | ICD-10-CM | POA: Diagnosis not present

## 2018-10-23 DIAGNOSIS — Z85828 Personal history of other malignant neoplasm of skin: Secondary | ICD-10-CM | POA: Diagnosis not present

## 2018-10-23 DIAGNOSIS — M19041 Primary osteoarthritis, right hand: Secondary | ICD-10-CM | POA: Diagnosis not present

## 2018-10-23 DIAGNOSIS — Z8673 Personal history of transient ischemic attack (TIA), and cerebral infarction without residual deficits: Secondary | ICD-10-CM | POA: Diagnosis not present

## 2018-10-23 DIAGNOSIS — M1612 Unilateral primary osteoarthritis, left hip: Secondary | ICD-10-CM | POA: Diagnosis not present

## 2018-10-23 DIAGNOSIS — M19071 Primary osteoarthritis, right ankle and foot: Secondary | ICD-10-CM | POA: Diagnosis not present

## 2018-10-23 DIAGNOSIS — R296 Repeated falls: Secondary | ICD-10-CM | POA: Diagnosis not present

## 2018-10-23 DIAGNOSIS — Z9181 History of falling: Secondary | ICD-10-CM | POA: Diagnosis not present

## 2018-10-24 DIAGNOSIS — E785 Hyperlipidemia, unspecified: Secondary | ICD-10-CM | POA: Diagnosis not present

## 2018-10-24 DIAGNOSIS — I10 Essential (primary) hypertension: Secondary | ICD-10-CM | POA: Diagnosis not present

## 2018-10-24 DIAGNOSIS — S81811D Laceration without foreign body, right lower leg, subsequent encounter: Secondary | ICD-10-CM | POA: Diagnosis not present

## 2018-10-24 DIAGNOSIS — M19041 Primary osteoarthritis, right hand: Secondary | ICD-10-CM | POA: Diagnosis not present

## 2018-10-24 DIAGNOSIS — M19071 Primary osteoarthritis, right ankle and foot: Secondary | ICD-10-CM | POA: Diagnosis not present

## 2018-10-24 DIAGNOSIS — M1612 Unilateral primary osteoarthritis, left hip: Secondary | ICD-10-CM | POA: Diagnosis not present

## 2018-10-25 DIAGNOSIS — M19041 Primary osteoarthritis, right hand: Secondary | ICD-10-CM | POA: Diagnosis not present

## 2018-10-25 DIAGNOSIS — M19071 Primary osteoarthritis, right ankle and foot: Secondary | ICD-10-CM | POA: Diagnosis not present

## 2018-10-25 DIAGNOSIS — S81811D Laceration without foreign body, right lower leg, subsequent encounter: Secondary | ICD-10-CM | POA: Diagnosis not present

## 2018-10-25 DIAGNOSIS — M1612 Unilateral primary osteoarthritis, left hip: Secondary | ICD-10-CM | POA: Diagnosis not present

## 2018-10-25 DIAGNOSIS — E785 Hyperlipidemia, unspecified: Secondary | ICD-10-CM | POA: Diagnosis not present

## 2018-10-25 DIAGNOSIS — I10 Essential (primary) hypertension: Secondary | ICD-10-CM | POA: Diagnosis not present

## 2018-10-27 DIAGNOSIS — M1612 Unilateral primary osteoarthritis, left hip: Secondary | ICD-10-CM | POA: Diagnosis not present

## 2018-10-27 DIAGNOSIS — S81811D Laceration without foreign body, right lower leg, subsequent encounter: Secondary | ICD-10-CM | POA: Diagnosis not present

## 2018-10-27 DIAGNOSIS — M19071 Primary osteoarthritis, right ankle and foot: Secondary | ICD-10-CM | POA: Diagnosis not present

## 2018-10-27 DIAGNOSIS — I10 Essential (primary) hypertension: Secondary | ICD-10-CM | POA: Diagnosis not present

## 2018-10-27 DIAGNOSIS — M19041 Primary osteoarthritis, right hand: Secondary | ICD-10-CM | POA: Diagnosis not present

## 2018-10-27 DIAGNOSIS — E785 Hyperlipidemia, unspecified: Secondary | ICD-10-CM | POA: Diagnosis not present

## 2018-10-29 DIAGNOSIS — I10 Essential (primary) hypertension: Secondary | ICD-10-CM | POA: Diagnosis not present

## 2018-10-29 DIAGNOSIS — M1612 Unilateral primary osteoarthritis, left hip: Secondary | ICD-10-CM | POA: Diagnosis not present

## 2018-10-29 DIAGNOSIS — M19071 Primary osteoarthritis, right ankle and foot: Secondary | ICD-10-CM | POA: Diagnosis not present

## 2018-10-29 DIAGNOSIS — E785 Hyperlipidemia, unspecified: Secondary | ICD-10-CM | POA: Diagnosis not present

## 2018-10-29 DIAGNOSIS — M19041 Primary osteoarthritis, right hand: Secondary | ICD-10-CM | POA: Diagnosis not present

## 2018-10-29 DIAGNOSIS — S81811D Laceration without foreign body, right lower leg, subsequent encounter: Secondary | ICD-10-CM | POA: Diagnosis not present

## 2018-10-30 DIAGNOSIS — M1612 Unilateral primary osteoarthritis, left hip: Secondary | ICD-10-CM | POA: Diagnosis not present

## 2018-10-30 DIAGNOSIS — S81811D Laceration without foreign body, right lower leg, subsequent encounter: Secondary | ICD-10-CM | POA: Diagnosis not present

## 2018-10-30 DIAGNOSIS — M19071 Primary osteoarthritis, right ankle and foot: Secondary | ICD-10-CM | POA: Diagnosis not present

## 2018-10-30 DIAGNOSIS — I10 Essential (primary) hypertension: Secondary | ICD-10-CM | POA: Diagnosis not present

## 2018-10-30 DIAGNOSIS — M19041 Primary osteoarthritis, right hand: Secondary | ICD-10-CM | POA: Diagnosis not present

## 2018-10-30 DIAGNOSIS — E785 Hyperlipidemia, unspecified: Secondary | ICD-10-CM | POA: Diagnosis not present

## 2018-10-31 DIAGNOSIS — M1612 Unilateral primary osteoarthritis, left hip: Secondary | ICD-10-CM | POA: Diagnosis not present

## 2018-10-31 DIAGNOSIS — M19071 Primary osteoarthritis, right ankle and foot: Secondary | ICD-10-CM | POA: Diagnosis not present

## 2018-10-31 DIAGNOSIS — I10 Essential (primary) hypertension: Secondary | ICD-10-CM | POA: Diagnosis not present

## 2018-10-31 DIAGNOSIS — E785 Hyperlipidemia, unspecified: Secondary | ICD-10-CM | POA: Diagnosis not present

## 2018-10-31 DIAGNOSIS — M19041 Primary osteoarthritis, right hand: Secondary | ICD-10-CM | POA: Diagnosis not present

## 2018-10-31 DIAGNOSIS — S81811D Laceration without foreign body, right lower leg, subsequent encounter: Secondary | ICD-10-CM | POA: Diagnosis not present

## 2018-11-01 DIAGNOSIS — I10 Essential (primary) hypertension: Secondary | ICD-10-CM | POA: Diagnosis not present

## 2018-11-01 DIAGNOSIS — M1612 Unilateral primary osteoarthritis, left hip: Secondary | ICD-10-CM | POA: Diagnosis not present

## 2018-11-01 DIAGNOSIS — S81811D Laceration without foreign body, right lower leg, subsequent encounter: Secondary | ICD-10-CM | POA: Diagnosis not present

## 2018-11-01 DIAGNOSIS — E785 Hyperlipidemia, unspecified: Secondary | ICD-10-CM | POA: Diagnosis not present

## 2018-11-01 DIAGNOSIS — M19041 Primary osteoarthritis, right hand: Secondary | ICD-10-CM | POA: Diagnosis not present

## 2018-11-01 DIAGNOSIS — M25552 Pain in left hip: Secondary | ICD-10-CM | POA: Diagnosis not present

## 2018-11-01 DIAGNOSIS — M19071 Primary osteoarthritis, right ankle and foot: Secondary | ICD-10-CM | POA: Diagnosis not present

## 2018-11-05 DIAGNOSIS — S81811D Laceration without foreign body, right lower leg, subsequent encounter: Secondary | ICD-10-CM | POA: Diagnosis not present

## 2018-11-05 DIAGNOSIS — I10 Essential (primary) hypertension: Secondary | ICD-10-CM | POA: Diagnosis not present

## 2018-11-05 DIAGNOSIS — M1612 Unilateral primary osteoarthritis, left hip: Secondary | ICD-10-CM | POA: Diagnosis not present

## 2018-11-05 DIAGNOSIS — E785 Hyperlipidemia, unspecified: Secondary | ICD-10-CM | POA: Diagnosis not present

## 2018-11-05 DIAGNOSIS — M19041 Primary osteoarthritis, right hand: Secondary | ICD-10-CM | POA: Diagnosis not present

## 2018-11-05 DIAGNOSIS — M19071 Primary osteoarthritis, right ankle and foot: Secondary | ICD-10-CM | POA: Diagnosis not present

## 2018-11-07 DIAGNOSIS — M1612 Unilateral primary osteoarthritis, left hip: Secondary | ICD-10-CM | POA: Diagnosis not present

## 2018-11-07 DIAGNOSIS — S81811D Laceration without foreign body, right lower leg, subsequent encounter: Secondary | ICD-10-CM | POA: Diagnosis not present

## 2018-11-07 DIAGNOSIS — M19041 Primary osteoarthritis, right hand: Secondary | ICD-10-CM | POA: Diagnosis not present

## 2018-11-07 DIAGNOSIS — E785 Hyperlipidemia, unspecified: Secondary | ICD-10-CM | POA: Diagnosis not present

## 2018-11-07 DIAGNOSIS — M19071 Primary osteoarthritis, right ankle and foot: Secondary | ICD-10-CM | POA: Diagnosis not present

## 2018-11-07 DIAGNOSIS — I10 Essential (primary) hypertension: Secondary | ICD-10-CM | POA: Diagnosis not present

## 2018-11-12 DIAGNOSIS — Z09 Encounter for follow-up examination after completed treatment for conditions other than malignant neoplasm: Secondary | ICD-10-CM | POA: Diagnosis not present

## 2018-11-12 DIAGNOSIS — S81801A Unspecified open wound, right lower leg, initial encounter: Secondary | ICD-10-CM | POA: Diagnosis not present

## 2018-11-13 DIAGNOSIS — E785 Hyperlipidemia, unspecified: Secondary | ICD-10-CM | POA: Diagnosis not present

## 2018-11-13 DIAGNOSIS — M19041 Primary osteoarthritis, right hand: Secondary | ICD-10-CM | POA: Diagnosis not present

## 2018-11-13 DIAGNOSIS — M19071 Primary osteoarthritis, right ankle and foot: Secondary | ICD-10-CM | POA: Diagnosis not present

## 2018-11-13 DIAGNOSIS — M1612 Unilateral primary osteoarthritis, left hip: Secondary | ICD-10-CM | POA: Diagnosis not present

## 2018-11-13 DIAGNOSIS — S81811D Laceration without foreign body, right lower leg, subsequent encounter: Secondary | ICD-10-CM | POA: Diagnosis not present

## 2018-11-13 DIAGNOSIS — I10 Essential (primary) hypertension: Secondary | ICD-10-CM | POA: Diagnosis not present

## 2018-11-14 DIAGNOSIS — M19041 Primary osteoarthritis, right hand: Secondary | ICD-10-CM | POA: Diagnosis not present

## 2018-11-14 DIAGNOSIS — M1612 Unilateral primary osteoarthritis, left hip: Secondary | ICD-10-CM | POA: Diagnosis not present

## 2018-11-14 DIAGNOSIS — E785 Hyperlipidemia, unspecified: Secondary | ICD-10-CM | POA: Diagnosis not present

## 2018-11-14 DIAGNOSIS — S81811D Laceration without foreign body, right lower leg, subsequent encounter: Secondary | ICD-10-CM | POA: Diagnosis not present

## 2018-11-14 DIAGNOSIS — M19071 Primary osteoarthritis, right ankle and foot: Secondary | ICD-10-CM | POA: Diagnosis not present

## 2018-11-14 DIAGNOSIS — I10 Essential (primary) hypertension: Secondary | ICD-10-CM | POA: Diagnosis not present

## 2018-11-15 DIAGNOSIS — I10 Essential (primary) hypertension: Secondary | ICD-10-CM | POA: Diagnosis not present

## 2018-11-15 DIAGNOSIS — M19041 Primary osteoarthritis, right hand: Secondary | ICD-10-CM | POA: Diagnosis not present

## 2018-11-15 DIAGNOSIS — E785 Hyperlipidemia, unspecified: Secondary | ICD-10-CM | POA: Diagnosis not present

## 2018-11-15 DIAGNOSIS — M1612 Unilateral primary osteoarthritis, left hip: Secondary | ICD-10-CM | POA: Diagnosis not present

## 2018-11-15 DIAGNOSIS — S81811D Laceration without foreign body, right lower leg, subsequent encounter: Secondary | ICD-10-CM | POA: Diagnosis not present

## 2018-11-15 DIAGNOSIS — M19071 Primary osteoarthritis, right ankle and foot: Secondary | ICD-10-CM | POA: Diagnosis not present

## 2018-11-21 DIAGNOSIS — M1612 Unilateral primary osteoarthritis, left hip: Secondary | ICD-10-CM | POA: Diagnosis not present

## 2018-11-21 DIAGNOSIS — M19041 Primary osteoarthritis, right hand: Secondary | ICD-10-CM | POA: Diagnosis not present

## 2018-11-21 DIAGNOSIS — M19071 Primary osteoarthritis, right ankle and foot: Secondary | ICD-10-CM | POA: Diagnosis not present

## 2018-11-21 DIAGNOSIS — I10 Essential (primary) hypertension: Secondary | ICD-10-CM | POA: Diagnosis not present

## 2018-11-21 DIAGNOSIS — E785 Hyperlipidemia, unspecified: Secondary | ICD-10-CM | POA: Diagnosis not present

## 2018-11-21 DIAGNOSIS — S81811D Laceration without foreign body, right lower leg, subsequent encounter: Secondary | ICD-10-CM | POA: Diagnosis not present

## 2018-11-22 DIAGNOSIS — E785 Hyperlipidemia, unspecified: Secondary | ICD-10-CM | POA: Diagnosis not present

## 2018-11-22 DIAGNOSIS — M1612 Unilateral primary osteoarthritis, left hip: Secondary | ICD-10-CM | POA: Diagnosis not present

## 2018-11-22 DIAGNOSIS — M19071 Primary osteoarthritis, right ankle and foot: Secondary | ICD-10-CM | POA: Diagnosis not present

## 2018-11-22 DIAGNOSIS — R296 Repeated falls: Secondary | ICD-10-CM | POA: Diagnosis not present

## 2018-11-22 DIAGNOSIS — H35372 Puckering of macula, left eye: Secondary | ICD-10-CM | POA: Diagnosis not present

## 2018-11-22 DIAGNOSIS — I10 Essential (primary) hypertension: Secondary | ICD-10-CM | POA: Diagnosis not present

## 2018-11-22 DIAGNOSIS — M19041 Primary osteoarthritis, right hand: Secondary | ICD-10-CM | POA: Diagnosis not present

## 2018-11-22 DIAGNOSIS — Z8673 Personal history of transient ischemic attack (TIA), and cerebral infarction without residual deficits: Secondary | ICD-10-CM | POA: Diagnosis not present

## 2018-11-22 DIAGNOSIS — Z9181 History of falling: Secondary | ICD-10-CM | POA: Diagnosis not present

## 2018-11-22 DIAGNOSIS — S81811D Laceration without foreign body, right lower leg, subsequent encounter: Secondary | ICD-10-CM | POA: Diagnosis not present

## 2018-11-22 DIAGNOSIS — Z85828 Personal history of other malignant neoplasm of skin: Secondary | ICD-10-CM | POA: Diagnosis not present

## 2018-11-26 DIAGNOSIS — M1612 Unilateral primary osteoarthritis, left hip: Secondary | ICD-10-CM | POA: Diagnosis not present

## 2018-11-26 DIAGNOSIS — I10 Essential (primary) hypertension: Secondary | ICD-10-CM | POA: Diagnosis not present

## 2018-11-26 DIAGNOSIS — E785 Hyperlipidemia, unspecified: Secondary | ICD-10-CM | POA: Diagnosis not present

## 2018-11-26 DIAGNOSIS — M19071 Primary osteoarthritis, right ankle and foot: Secondary | ICD-10-CM | POA: Diagnosis not present

## 2018-11-26 DIAGNOSIS — M19041 Primary osteoarthritis, right hand: Secondary | ICD-10-CM | POA: Diagnosis not present

## 2018-11-26 DIAGNOSIS — S81811D Laceration without foreign body, right lower leg, subsequent encounter: Secondary | ICD-10-CM | POA: Diagnosis not present

## 2018-12-03 DIAGNOSIS — M19071 Primary osteoarthritis, right ankle and foot: Secondary | ICD-10-CM | POA: Diagnosis not present

## 2018-12-03 DIAGNOSIS — E785 Hyperlipidemia, unspecified: Secondary | ICD-10-CM | POA: Diagnosis not present

## 2018-12-03 DIAGNOSIS — M19041 Primary osteoarthritis, right hand: Secondary | ICD-10-CM | POA: Diagnosis not present

## 2018-12-03 DIAGNOSIS — I10 Essential (primary) hypertension: Secondary | ICD-10-CM | POA: Diagnosis not present

## 2018-12-03 DIAGNOSIS — S81811D Laceration without foreign body, right lower leg, subsequent encounter: Secondary | ICD-10-CM | POA: Diagnosis not present

## 2018-12-03 DIAGNOSIS — M1612 Unilateral primary osteoarthritis, left hip: Secondary | ICD-10-CM | POA: Diagnosis not present

## 2018-12-10 DIAGNOSIS — E785 Hyperlipidemia, unspecified: Secondary | ICD-10-CM | POA: Diagnosis not present

## 2018-12-10 DIAGNOSIS — M1612 Unilateral primary osteoarthritis, left hip: Secondary | ICD-10-CM | POA: Diagnosis not present

## 2018-12-10 DIAGNOSIS — M19071 Primary osteoarthritis, right ankle and foot: Secondary | ICD-10-CM | POA: Diagnosis not present

## 2018-12-10 DIAGNOSIS — M19041 Primary osteoarthritis, right hand: Secondary | ICD-10-CM | POA: Diagnosis not present

## 2018-12-10 DIAGNOSIS — S81811D Laceration without foreign body, right lower leg, subsequent encounter: Secondary | ICD-10-CM | POA: Diagnosis not present

## 2018-12-10 DIAGNOSIS — I10 Essential (primary) hypertension: Secondary | ICD-10-CM | POA: Diagnosis not present

## 2018-12-26 DIAGNOSIS — I10 Essential (primary) hypertension: Secondary | ICD-10-CM | POA: Diagnosis not present

## 2018-12-26 DIAGNOSIS — H9190 Unspecified hearing loss, unspecified ear: Secondary | ICD-10-CM | POA: Diagnosis not present

## 2018-12-26 DIAGNOSIS — M1990 Unspecified osteoarthritis, unspecified site: Secondary | ICD-10-CM | POA: Diagnosis not present

## 2018-12-26 DIAGNOSIS — D485 Neoplasm of uncertain behavior of skin: Secondary | ICD-10-CM | POA: Diagnosis not present

## 2018-12-26 DIAGNOSIS — E7800 Pure hypercholesterolemia, unspecified: Secondary | ICD-10-CM | POA: Diagnosis not present

## 2018-12-26 DIAGNOSIS — Z0000 Encounter for general adult medical examination without abnormal findings: Secondary | ICD-10-CM | POA: Diagnosis not present

## 2018-12-26 DIAGNOSIS — D649 Anemia, unspecified: Secondary | ICD-10-CM | POA: Diagnosis not present

## 2018-12-26 DIAGNOSIS — K4190 Unilateral femoral hernia, without obstruction or gangrene, not specified as recurrent: Secondary | ICD-10-CM | POA: Diagnosis not present

## 2018-12-26 DIAGNOSIS — I679 Cerebrovascular disease, unspecified: Secondary | ICD-10-CM | POA: Diagnosis not present

## 2019-01-14 DIAGNOSIS — I8311 Varicose veins of right lower extremity with inflammation: Secondary | ICD-10-CM | POA: Diagnosis not present

## 2019-01-14 DIAGNOSIS — I8312 Varicose veins of left lower extremity with inflammation: Secondary | ICD-10-CM | POA: Diagnosis not present

## 2019-01-14 DIAGNOSIS — Z85828 Personal history of other malignant neoplasm of skin: Secondary | ICD-10-CM | POA: Diagnosis not present

## 2019-01-14 DIAGNOSIS — L0100 Impetigo, unspecified: Secondary | ICD-10-CM | POA: Diagnosis not present

## 2019-01-14 DIAGNOSIS — L089 Local infection of the skin and subcutaneous tissue, unspecified: Secondary | ICD-10-CM | POA: Diagnosis not present

## 2019-01-14 DIAGNOSIS — L905 Scar conditions and fibrosis of skin: Secondary | ICD-10-CM | POA: Diagnosis not present

## 2019-01-14 DIAGNOSIS — I872 Venous insufficiency (chronic) (peripheral): Secondary | ICD-10-CM | POA: Diagnosis not present

## 2019-01-21 DIAGNOSIS — L309 Dermatitis, unspecified: Secondary | ICD-10-CM | POA: Diagnosis not present

## 2019-01-21 DIAGNOSIS — I872 Venous insufficiency (chronic) (peripheral): Secondary | ICD-10-CM | POA: Diagnosis not present

## 2019-01-21 DIAGNOSIS — Z85828 Personal history of other malignant neoplasm of skin: Secondary | ICD-10-CM | POA: Diagnosis not present

## 2019-01-21 DIAGNOSIS — I8311 Varicose veins of right lower extremity with inflammation: Secondary | ICD-10-CM | POA: Diagnosis not present

## 2019-01-21 DIAGNOSIS — I8312 Varicose veins of left lower extremity with inflammation: Secondary | ICD-10-CM | POA: Diagnosis not present

## 2019-01-28 DIAGNOSIS — I872 Venous insufficiency (chronic) (peripheral): Secondary | ICD-10-CM | POA: Diagnosis not present

## 2019-01-28 DIAGNOSIS — I8312 Varicose veins of left lower extremity with inflammation: Secondary | ICD-10-CM | POA: Diagnosis not present

## 2019-01-28 DIAGNOSIS — L309 Dermatitis, unspecified: Secondary | ICD-10-CM | POA: Diagnosis not present

## 2019-01-28 DIAGNOSIS — I8311 Varicose veins of right lower extremity with inflammation: Secondary | ICD-10-CM | POA: Diagnosis not present

## 2019-01-28 DIAGNOSIS — Z85828 Personal history of other malignant neoplasm of skin: Secondary | ICD-10-CM | POA: Diagnosis not present

## 2019-02-06 ENCOUNTER — Encounter (INDEPENDENT_AMBULATORY_CARE_PROVIDER_SITE_OTHER): Payer: Medicare Other | Admitting: Ophthalmology

## 2019-02-12 DIAGNOSIS — L98499 Non-pressure chronic ulcer of skin of other sites with unspecified severity: Secondary | ICD-10-CM | POA: Diagnosis not present

## 2019-02-12 DIAGNOSIS — Z85828 Personal history of other malignant neoplasm of skin: Secondary | ICD-10-CM | POA: Diagnosis not present

## 2019-02-27 DIAGNOSIS — Z85828 Personal history of other malignant neoplasm of skin: Secondary | ICD-10-CM | POA: Diagnosis not present

## 2019-02-27 DIAGNOSIS — L98499 Non-pressure chronic ulcer of skin of other sites with unspecified severity: Secondary | ICD-10-CM | POA: Diagnosis not present

## 2019-03-22 DIAGNOSIS — M25552 Pain in left hip: Secondary | ICD-10-CM | POA: Diagnosis not present

## 2019-03-22 DIAGNOSIS — R0902 Hypoxemia: Secondary | ICD-10-CM | POA: Diagnosis not present

## 2019-03-22 DIAGNOSIS — M1612 Unilateral primary osteoarthritis, left hip: Secondary | ICD-10-CM | POA: Diagnosis not present

## 2019-03-22 DIAGNOSIS — M7062 Trochanteric bursitis, left hip: Secondary | ICD-10-CM | POA: Diagnosis not present

## 2019-03-22 DIAGNOSIS — M2550 Pain in unspecified joint: Secondary | ICD-10-CM | POA: Diagnosis not present

## 2019-03-22 DIAGNOSIS — Z7401 Bed confinement status: Secondary | ICD-10-CM | POA: Diagnosis not present

## 2019-03-25 ENCOUNTER — Other Ambulatory Visit (HOSPITAL_COMMUNITY): Payer: Self-pay | Admitting: Student

## 2019-03-25 DIAGNOSIS — M25552 Pain in left hip: Secondary | ICD-10-CM

## 2019-03-31 ENCOUNTER — Ambulatory Visit (HOSPITAL_COMMUNITY)
Admission: RE | Admit: 2019-03-31 | Discharge: 2019-03-31 | Disposition: A | Discharge status: Home / Self Care | Payer: Medicare Other | Source: Ambulatory Visit | Attending: Student | Admitting: Student

## 2019-03-31 ENCOUNTER — Other Ambulatory Visit: Payer: Self-pay

## 2019-03-31 DIAGNOSIS — M25552 Pain in left hip: Secondary | ICD-10-CM | POA: Diagnosis not present

## 2019-03-31 DIAGNOSIS — S76312A Strain of muscle, fascia and tendon of the posterior muscle group at thigh level, left thigh, initial encounter: Secondary | ICD-10-CM | POA: Diagnosis not present

## 2019-04-01 DIAGNOSIS — Z85828 Personal history of other malignant neoplasm of skin: Secondary | ICD-10-CM | POA: Diagnosis not present

## 2019-04-01 DIAGNOSIS — I83213 Varicose veins of right lower extremity with both ulcer of ankle and inflammation: Secondary | ICD-10-CM | POA: Diagnosis not present

## 2019-04-03 ENCOUNTER — Encounter (INDEPENDENT_AMBULATORY_CARE_PROVIDER_SITE_OTHER): Payer: Medicare Other | Admitting: Ophthalmology

## 2019-04-22 DIAGNOSIS — I872 Venous insufficiency (chronic) (peripheral): Secondary | ICD-10-CM | POA: Diagnosis not present

## 2019-04-22 DIAGNOSIS — Z85828 Personal history of other malignant neoplasm of skin: Secondary | ICD-10-CM | POA: Diagnosis not present

## 2019-04-22 DIAGNOSIS — I8311 Varicose veins of right lower extremity with inflammation: Secondary | ICD-10-CM | POA: Diagnosis not present

## 2019-04-22 DIAGNOSIS — I8312 Varicose veins of left lower extremity with inflammation: Secondary | ICD-10-CM | POA: Diagnosis not present

## 2019-04-26 DIAGNOSIS — Z0000 Encounter for general adult medical examination without abnormal findings: Secondary | ICD-10-CM | POA: Diagnosis not present

## 2019-04-26 DIAGNOSIS — I679 Cerebrovascular disease, unspecified: Secondary | ICD-10-CM | POA: Diagnosis not present

## 2019-04-26 DIAGNOSIS — M1990 Unspecified osteoarthritis, unspecified site: Secondary | ICD-10-CM | POA: Diagnosis not present

## 2019-04-26 DIAGNOSIS — E7800 Pure hypercholesterolemia, unspecified: Secondary | ICD-10-CM | POA: Diagnosis not present

## 2019-04-26 DIAGNOSIS — K4190 Unilateral femoral hernia, without obstruction or gangrene, not specified as recurrent: Secondary | ICD-10-CM | POA: Diagnosis not present

## 2019-04-26 DIAGNOSIS — D649 Anemia, unspecified: Secondary | ICD-10-CM | POA: Diagnosis not present

## 2019-04-26 DIAGNOSIS — I10 Essential (primary) hypertension: Secondary | ICD-10-CM | POA: Diagnosis not present

## 2019-04-26 DIAGNOSIS — H9190 Unspecified hearing loss, unspecified ear: Secondary | ICD-10-CM | POA: Diagnosis not present

## 2019-05-15 DIAGNOSIS — H52223 Regular astigmatism, bilateral: Secondary | ICD-10-CM | POA: Diagnosis not present

## 2019-05-15 DIAGNOSIS — H524 Presbyopia: Secondary | ICD-10-CM | POA: Diagnosis not present

## 2019-05-15 DIAGNOSIS — H353121 Nonexudative age-related macular degeneration, left eye, early dry stage: Secondary | ICD-10-CM | POA: Diagnosis not present

## 2019-05-15 DIAGNOSIS — H353111 Nonexudative age-related macular degeneration, right eye, early dry stage: Secondary | ICD-10-CM | POA: Diagnosis not present

## 2019-05-15 DIAGNOSIS — H5213 Myopia, bilateral: Secondary | ICD-10-CM | POA: Diagnosis not present

## 2019-05-15 DIAGNOSIS — Z961 Presence of intraocular lens: Secondary | ICD-10-CM | POA: Diagnosis not present

## 2019-06-12 DIAGNOSIS — I872 Venous insufficiency (chronic) (peripheral): Secondary | ICD-10-CM | POA: Diagnosis not present

## 2019-06-12 DIAGNOSIS — I8312 Varicose veins of left lower extremity with inflammation: Secondary | ICD-10-CM | POA: Diagnosis not present

## 2019-06-12 DIAGNOSIS — Z85828 Personal history of other malignant neoplasm of skin: Secondary | ICD-10-CM | POA: Diagnosis not present

## 2019-06-12 DIAGNOSIS — I8311 Varicose veins of right lower extremity with inflammation: Secondary | ICD-10-CM | POA: Diagnosis not present

## 2019-07-16 DIAGNOSIS — J019 Acute sinusitis, unspecified: Secondary | ICD-10-CM | POA: Diagnosis not present

## 2019-07-16 DIAGNOSIS — J029 Acute pharyngitis, unspecified: Secondary | ICD-10-CM | POA: Diagnosis not present

## 2019-07-16 DIAGNOSIS — R05 Cough: Secondary | ICD-10-CM | POA: Diagnosis not present

## 2019-10-22 ENCOUNTER — Encounter (INDEPENDENT_AMBULATORY_CARE_PROVIDER_SITE_OTHER): Payer: Medicare Other | Admitting: Ophthalmology

## 2019-10-22 DIAGNOSIS — H43813 Vitreous degeneration, bilateral: Secondary | ICD-10-CM

## 2019-10-22 DIAGNOSIS — H35033 Hypertensive retinopathy, bilateral: Secondary | ICD-10-CM | POA: Diagnosis not present

## 2019-10-22 DIAGNOSIS — I10 Essential (primary) hypertension: Secondary | ICD-10-CM

## 2019-10-22 DIAGNOSIS — H338 Other retinal detachments: Secondary | ICD-10-CM | POA: Diagnosis not present

## 2019-10-22 DIAGNOSIS — H35372 Puckering of macula, left eye: Secondary | ICD-10-CM

## 2019-12-10 DIAGNOSIS — M25511 Pain in right shoulder: Secondary | ICD-10-CM | POA: Diagnosis not present

## 2019-12-10 DIAGNOSIS — M25512 Pain in left shoulder: Secondary | ICD-10-CM | POA: Diagnosis not present

## 2019-12-10 DIAGNOSIS — M19011 Primary osteoarthritis, right shoulder: Secondary | ICD-10-CM | POA: Diagnosis not present

## 2019-12-17 DIAGNOSIS — L439 Lichen planus, unspecified: Secondary | ICD-10-CM | POA: Diagnosis not present

## 2019-12-17 DIAGNOSIS — Z85828 Personal history of other malignant neoplasm of skin: Secondary | ICD-10-CM | POA: Diagnosis not present

## 2020-01-06 DIAGNOSIS — E7800 Pure hypercholesterolemia, unspecified: Secondary | ICD-10-CM | POA: Diagnosis not present

## 2020-01-06 DIAGNOSIS — R000 Tachycardia, unspecified: Secondary | ICD-10-CM | POA: Diagnosis not present

## 2020-01-06 DIAGNOSIS — I10 Essential (primary) hypertension: Secondary | ICD-10-CM | POA: Diagnosis not present

## 2020-01-06 DIAGNOSIS — Z0000 Encounter for general adult medical examination without abnormal findings: Secondary | ICD-10-CM | POA: Diagnosis not present

## 2020-01-06 DIAGNOSIS — H9190 Unspecified hearing loss, unspecified ear: Secondary | ICD-10-CM | POA: Diagnosis not present

## 2020-01-06 DIAGNOSIS — D649 Anemia, unspecified: Secondary | ICD-10-CM | POA: Diagnosis not present

## 2020-01-06 DIAGNOSIS — R634 Abnormal weight loss: Secondary | ICD-10-CM | POA: Diagnosis not present

## 2020-01-13 DIAGNOSIS — M19079 Primary osteoarthritis, unspecified ankle and foot: Secondary | ICD-10-CM | POA: Diagnosis not present

## 2020-01-13 DIAGNOSIS — M79671 Pain in right foot: Secondary | ICD-10-CM | POA: Diagnosis not present

## 2020-01-13 DIAGNOSIS — M2021 Hallux rigidus, right foot: Secondary | ICD-10-CM | POA: Diagnosis not present

## 2020-01-13 DIAGNOSIS — M79672 Pain in left foot: Secondary | ICD-10-CM | POA: Diagnosis not present

## 2020-01-13 DIAGNOSIS — B351 Tinea unguium: Secondary | ICD-10-CM | POA: Diagnosis not present

## 2020-01-20 DIAGNOSIS — L433 Subacute (active) lichen planus: Secondary | ICD-10-CM | POA: Diagnosis not present

## 2020-01-20 DIAGNOSIS — Z85828 Personal history of other malignant neoplasm of skin: Secondary | ICD-10-CM | POA: Diagnosis not present

## 2020-01-24 DIAGNOSIS — N289 Disorder of kidney and ureter, unspecified: Secondary | ICD-10-CM | POA: Diagnosis not present

## 2020-01-24 DIAGNOSIS — D729 Disorder of white blood cells, unspecified: Secondary | ICD-10-CM | POA: Diagnosis not present

## 2020-02-06 DIAGNOSIS — H903 Sensorineural hearing loss, bilateral: Secondary | ICD-10-CM | POA: Diagnosis not present

## 2020-05-05 DIAGNOSIS — M25551 Pain in right hip: Secondary | ICD-10-CM | POA: Diagnosis not present

## 2020-05-08 DIAGNOSIS — M5136 Other intervertebral disc degeneration, lumbar region: Secondary | ICD-10-CM | POA: Diagnosis not present

## 2020-05-19 DIAGNOSIS — M5136 Other intervertebral disc degeneration, lumbar region: Secondary | ICD-10-CM | POA: Diagnosis not present

## 2020-05-27 DIAGNOSIS — M5136 Other intervertebral disc degeneration, lumbar region: Secondary | ICD-10-CM | POA: Diagnosis not present

## 2020-05-27 DIAGNOSIS — M25551 Pain in right hip: Secondary | ICD-10-CM | POA: Diagnosis not present

## 2020-05-27 DIAGNOSIS — M7061 Trochanteric bursitis, right hip: Secondary | ICD-10-CM | POA: Diagnosis not present

## 2020-06-23 DIAGNOSIS — M545 Low back pain: Secondary | ICD-10-CM | POA: Diagnosis not present

## 2020-07-03 DIAGNOSIS — M5416 Radiculopathy, lumbar region: Secondary | ICD-10-CM | POA: Diagnosis not present

## 2020-07-03 DIAGNOSIS — M5117 Intervertebral disc disorders with radiculopathy, lumbosacral region: Secondary | ICD-10-CM | POA: Diagnosis not present

## 2020-07-03 DIAGNOSIS — M5137 Other intervertebral disc degeneration, lumbosacral region: Secondary | ICD-10-CM | POA: Diagnosis not present

## 2020-07-21 DIAGNOSIS — H5213 Myopia, bilateral: Secondary | ICD-10-CM | POA: Diagnosis not present

## 2020-07-21 DIAGNOSIS — H52223 Regular astigmatism, bilateral: Secondary | ICD-10-CM | POA: Diagnosis not present

## 2020-07-21 DIAGNOSIS — H524 Presbyopia: Secondary | ICD-10-CM | POA: Diagnosis not present

## 2020-07-21 DIAGNOSIS — H43393 Other vitreous opacities, bilateral: Secondary | ICD-10-CM | POA: Diagnosis not present

## 2020-07-21 DIAGNOSIS — H43813 Vitreous degeneration, bilateral: Secondary | ICD-10-CM | POA: Diagnosis not present

## 2020-07-23 DIAGNOSIS — Z85828 Personal history of other malignant neoplasm of skin: Secondary | ICD-10-CM | POA: Diagnosis not present

## 2020-07-23 DIAGNOSIS — I83028 Varicose veins of left lower extremity with ulcer other part of lower leg: Secondary | ICD-10-CM | POA: Diagnosis not present

## 2020-09-01 DIAGNOSIS — M5136 Other intervertebral disc degeneration, lumbar region: Secondary | ICD-10-CM | POA: Diagnosis not present

## 2020-09-01 DIAGNOSIS — Z6820 Body mass index (BMI) 20.0-20.9, adult: Secondary | ICD-10-CM | POA: Diagnosis not present

## 2020-09-01 DIAGNOSIS — M150 Primary generalized (osteo)arthritis: Secondary | ICD-10-CM | POA: Diagnosis not present

## 2020-09-01 DIAGNOSIS — R768 Other specified abnormal immunological findings in serum: Secondary | ICD-10-CM | POA: Diagnosis not present

## 2020-09-07 DIAGNOSIS — H02423 Myogenic ptosis of bilateral eyelids: Secondary | ICD-10-CM | POA: Diagnosis not present

## 2020-09-07 DIAGNOSIS — H02834 Dermatochalasis of left upper eyelid: Secondary | ICD-10-CM | POA: Diagnosis not present

## 2020-09-07 DIAGNOSIS — H02831 Dermatochalasis of right upper eyelid: Secondary | ICD-10-CM | POA: Diagnosis not present

## 2020-09-07 DIAGNOSIS — H0279 Other degenerative disorders of eyelid and periocular area: Secondary | ICD-10-CM | POA: Diagnosis not present

## 2020-09-07 DIAGNOSIS — H02034 Senile entropion of left upper eyelid: Secondary | ICD-10-CM | POA: Diagnosis not present

## 2020-09-07 DIAGNOSIS — H53483 Generalized contraction of visual field, bilateral: Secondary | ICD-10-CM | POA: Diagnosis not present

## 2020-09-07 DIAGNOSIS — H57813 Brow ptosis, bilateral: Secondary | ICD-10-CM | POA: Diagnosis not present

## 2020-09-07 DIAGNOSIS — H02413 Mechanical ptosis of bilateral eyelids: Secondary | ICD-10-CM | POA: Diagnosis not present

## 2020-09-07 DIAGNOSIS — H02054 Trichiasis without entropian left upper eyelid: Secondary | ICD-10-CM | POA: Diagnosis not present

## 2020-09-14 DIAGNOSIS — H53483 Generalized contraction of visual field, bilateral: Secondary | ICD-10-CM | POA: Diagnosis not present

## 2020-09-14 DIAGNOSIS — H53482 Generalized contraction of visual field, left eye: Secondary | ICD-10-CM | POA: Diagnosis not present

## 2020-09-14 DIAGNOSIS — H53481 Generalized contraction of visual field, right eye: Secondary | ICD-10-CM | POA: Diagnosis not present

## 2020-09-15 DIAGNOSIS — R0989 Other specified symptoms and signs involving the circulatory and respiratory systems: Secondary | ICD-10-CM | POA: Diagnosis not present

## 2020-10-01 DIAGNOSIS — M5416 Radiculopathy, lumbar region: Secondary | ICD-10-CM | POA: Diagnosis not present

## 2020-10-01 DIAGNOSIS — G894 Chronic pain syndrome: Secondary | ICD-10-CM | POA: Diagnosis not present

## 2020-10-01 DIAGNOSIS — S76311A Strain of muscle, fascia and tendon of the posterior muscle group at thigh level, right thigh, initial encounter: Secondary | ICD-10-CM | POA: Diagnosis not present

## 2020-10-07 DIAGNOSIS — M19021 Primary osteoarthritis, right elbow: Secondary | ICD-10-CM | POA: Diagnosis not present

## 2020-10-07 DIAGNOSIS — E7800 Pure hypercholesterolemia, unspecified: Secondary | ICD-10-CM | POA: Diagnosis not present

## 2020-10-07 DIAGNOSIS — D649 Anemia, unspecified: Secondary | ICD-10-CM | POA: Diagnosis not present

## 2020-10-07 DIAGNOSIS — M5416 Radiculopathy, lumbar region: Secondary | ICD-10-CM | POA: Diagnosis not present

## 2020-10-07 DIAGNOSIS — Z8673 Personal history of transient ischemic attack (TIA), and cerebral infarction without residual deficits: Secondary | ICD-10-CM | POA: Diagnosis not present

## 2020-10-07 DIAGNOSIS — Z859 Personal history of malignant neoplasm, unspecified: Secondary | ICD-10-CM | POA: Diagnosis not present

## 2020-10-07 DIAGNOSIS — M19011 Primary osteoarthritis, right shoulder: Secondary | ICD-10-CM | POA: Diagnosis not present

## 2020-10-07 DIAGNOSIS — G894 Chronic pain syndrome: Secondary | ICD-10-CM | POA: Diagnosis not present

## 2020-10-07 DIAGNOSIS — M5136 Other intervertebral disc degeneration, lumbar region: Secondary | ICD-10-CM | POA: Diagnosis not present

## 2020-10-09 DIAGNOSIS — M5416 Radiculopathy, lumbar region: Secondary | ICD-10-CM | POA: Diagnosis not present

## 2020-10-09 DIAGNOSIS — E7800 Pure hypercholesterolemia, unspecified: Secondary | ICD-10-CM | POA: Diagnosis not present

## 2020-10-09 DIAGNOSIS — G894 Chronic pain syndrome: Secondary | ICD-10-CM | POA: Diagnosis not present

## 2020-10-09 DIAGNOSIS — M19011 Primary osteoarthritis, right shoulder: Secondary | ICD-10-CM | POA: Diagnosis not present

## 2020-10-09 DIAGNOSIS — M19021 Primary osteoarthritis, right elbow: Secondary | ICD-10-CM | POA: Diagnosis not present

## 2020-10-09 DIAGNOSIS — M5136 Other intervertebral disc degeneration, lumbar region: Secondary | ICD-10-CM | POA: Diagnosis not present

## 2020-10-13 DIAGNOSIS — G894 Chronic pain syndrome: Secondary | ICD-10-CM | POA: Diagnosis not present

## 2020-10-13 DIAGNOSIS — M5136 Other intervertebral disc degeneration, lumbar region: Secondary | ICD-10-CM | POA: Diagnosis not present

## 2020-10-13 DIAGNOSIS — M19021 Primary osteoarthritis, right elbow: Secondary | ICD-10-CM | POA: Diagnosis not present

## 2020-10-13 DIAGNOSIS — E7800 Pure hypercholesterolemia, unspecified: Secondary | ICD-10-CM | POA: Diagnosis not present

## 2020-10-13 DIAGNOSIS — M5416 Radiculopathy, lumbar region: Secondary | ICD-10-CM | POA: Diagnosis not present

## 2020-10-13 DIAGNOSIS — M19011 Primary osteoarthritis, right shoulder: Secondary | ICD-10-CM | POA: Diagnosis not present

## 2020-10-16 DIAGNOSIS — M5116 Intervertebral disc disorders with radiculopathy, lumbar region: Secondary | ICD-10-CM | POA: Diagnosis not present

## 2020-10-16 DIAGNOSIS — M5416 Radiculopathy, lumbar region: Secondary | ICD-10-CM | POA: Diagnosis not present

## 2020-10-19 DIAGNOSIS — U071 COVID-19: Secondary | ICD-10-CM | POA: Diagnosis not present

## 2020-10-20 ENCOUNTER — Telehealth: Payer: Self-pay

## 2020-10-20 NOTE — Telephone Encounter (Signed)
Called to discuss with patient about COVID-19 symptoms and the use of one of the available treatments for those with mild to moderate Covid symptoms and at a high risk of hospitalization.  Pt appears to qualify for outpatient treatment due to co-morbid conditions and/or a member of an at-risk group in accordance with the FDA Emergency Use Authorization.    Symptom onset: Unknown Vaccinated: Unknown Booster? Unknown Immunocompromised? Unknown Qualifiers: Yes  Unable to reach pt - Left message with call back number [phone].   [name]

## 2020-10-21 ENCOUNTER — Encounter (INDEPENDENT_AMBULATORY_CARE_PROVIDER_SITE_OTHER): Payer: Medicare Other | Admitting: Ophthalmology

## 2020-10-28 DIAGNOSIS — J019 Acute sinusitis, unspecified: Secondary | ICD-10-CM | POA: Diagnosis not present

## 2020-12-23 DIAGNOSIS — M1612 Unilateral primary osteoarthritis, left hip: Secondary | ICD-10-CM | POA: Diagnosis not present

## 2021-01-01 DIAGNOSIS — M25552 Pain in left hip: Secondary | ICD-10-CM | POA: Diagnosis not present

## 2021-01-02 DIAGNOSIS — M25552 Pain in left hip: Secondary | ICD-10-CM | POA: Diagnosis not present

## 2021-01-11 DIAGNOSIS — E871 Hypo-osmolality and hyponatremia: Secondary | ICD-10-CM | POA: Diagnosis not present

## 2021-01-11 DIAGNOSIS — D72829 Elevated white blood cell count, unspecified: Secondary | ICD-10-CM | POA: Diagnosis not present

## 2021-01-11 DIAGNOSIS — M1990 Unspecified osteoarthritis, unspecified site: Secondary | ICD-10-CM | POA: Diagnosis not present

## 2021-01-11 DIAGNOSIS — G720 Drug-induced myopathy: Secondary | ICD-10-CM | POA: Diagnosis not present

## 2021-01-11 DIAGNOSIS — Z0000 Encounter for general adult medical examination without abnormal findings: Secondary | ICD-10-CM | POA: Diagnosis not present

## 2021-01-11 DIAGNOSIS — E7800 Pure hypercholesterolemia, unspecified: Secondary | ICD-10-CM | POA: Diagnosis not present

## 2021-01-11 DIAGNOSIS — I10 Essential (primary) hypertension: Secondary | ICD-10-CM | POA: Diagnosis not present

## 2021-01-11 DIAGNOSIS — I679 Cerebrovascular disease, unspecified: Secondary | ICD-10-CM | POA: Diagnosis not present

## 2021-01-11 DIAGNOSIS — H9209 Otalgia, unspecified ear: Secondary | ICD-10-CM | POA: Diagnosis not present

## 2021-01-11 DIAGNOSIS — R059 Cough, unspecified: Secondary | ICD-10-CM | POA: Diagnosis not present

## 2021-01-11 DIAGNOSIS — D649 Anemia, unspecified: Secondary | ICD-10-CM | POA: Diagnosis not present

## 2021-01-15 DIAGNOSIS — H93A1 Pulsatile tinnitus, right ear: Secondary | ICD-10-CM | POA: Diagnosis not present

## 2021-01-15 DIAGNOSIS — H9193 Unspecified hearing loss, bilateral: Secondary | ICD-10-CM | POA: Diagnosis not present

## 2021-01-15 DIAGNOSIS — H6121 Impacted cerumen, right ear: Secondary | ICD-10-CM | POA: Diagnosis not present

## 2021-01-15 DIAGNOSIS — Z974 Presence of external hearing-aid: Secondary | ICD-10-CM | POA: Diagnosis not present

## 2021-01-19 DIAGNOSIS — M5459 Other low back pain: Secondary | ICD-10-CM | POA: Diagnosis not present

## 2021-01-19 DIAGNOSIS — M545 Low back pain, unspecified: Secondary | ICD-10-CM | POA: Diagnosis not present

## 2021-01-25 DIAGNOSIS — H90A31 Mixed conductive and sensorineural hearing loss, unilateral, right ear with restricted hearing on the contralateral side: Secondary | ICD-10-CM | POA: Diagnosis not present

## 2021-01-25 DIAGNOSIS — H90A22 Sensorineural hearing loss, unilateral, left ear, with restricted hearing on the contralateral side: Secondary | ICD-10-CM | POA: Diagnosis not present

## 2021-01-28 DIAGNOSIS — L308 Other specified dermatitis: Secondary | ICD-10-CM | POA: Diagnosis not present

## 2021-01-28 DIAGNOSIS — L0889 Other specified local infections of the skin and subcutaneous tissue: Secondary | ICD-10-CM | POA: Diagnosis not present

## 2021-01-28 DIAGNOSIS — I8311 Varicose veins of right lower extremity with inflammation: Secondary | ICD-10-CM | POA: Diagnosis not present

## 2021-01-28 DIAGNOSIS — I872 Venous insufficiency (chronic) (peripheral): Secondary | ICD-10-CM | POA: Diagnosis not present

## 2021-01-28 DIAGNOSIS — L03116 Cellulitis of left lower limb: Secondary | ICD-10-CM | POA: Diagnosis not present

## 2021-01-28 DIAGNOSIS — I8312 Varicose veins of left lower extremity with inflammation: Secondary | ICD-10-CM | POA: Diagnosis not present

## 2021-01-28 DIAGNOSIS — Z85828 Personal history of other malignant neoplasm of skin: Secondary | ICD-10-CM | POA: Diagnosis not present

## 2021-02-12 DIAGNOSIS — D72829 Elevated white blood cell count, unspecified: Secondary | ICD-10-CM | POA: Diagnosis not present

## 2021-02-12 DIAGNOSIS — R059 Cough, unspecified: Secondary | ICD-10-CM | POA: Diagnosis not present

## 2021-02-12 DIAGNOSIS — E871 Hypo-osmolality and hyponatremia: Secondary | ICD-10-CM | POA: Diagnosis not present

## 2021-02-19 DIAGNOSIS — M5416 Radiculopathy, lumbar region: Secondary | ICD-10-CM | POA: Diagnosis not present

## 2021-02-19 DIAGNOSIS — M5116 Intervertebral disc disorders with radiculopathy, lumbar region: Secondary | ICD-10-CM | POA: Diagnosis not present

## 2021-03-09 DIAGNOSIS — M5416 Radiculopathy, lumbar region: Secondary | ICD-10-CM | POA: Diagnosis not present

## 2021-03-09 DIAGNOSIS — M5136 Other intervertebral disc degeneration, lumbar region: Secondary | ICD-10-CM | POA: Diagnosis not present

## 2021-03-18 DIAGNOSIS — M5126 Other intervertebral disc displacement, lumbar region: Secondary | ICD-10-CM | POA: Diagnosis not present

## 2021-03-18 DIAGNOSIS — M5416 Radiculopathy, lumbar region: Secondary | ICD-10-CM | POA: Diagnosis not present

## 2021-03-24 ENCOUNTER — Other Ambulatory Visit: Payer: Self-pay | Admitting: Neurological Surgery

## 2021-03-28 DIAGNOSIS — Z20822 Contact with and (suspected) exposure to covid-19: Secondary | ICD-10-CM | POA: Diagnosis not present

## 2021-03-30 ENCOUNTER — Other Ambulatory Visit (HOSPITAL_COMMUNITY)
Admission: RE | Admit: 2021-03-30 | Discharge: 2021-03-30 | Disposition: A | Discharge status: Home / Self Care | Payer: Medicare Other | Source: Ambulatory Visit | Attending: Neurological Surgery | Admitting: Neurological Surgery

## 2021-03-30 ENCOUNTER — Encounter (HOSPITAL_COMMUNITY): Payer: Self-pay | Admitting: Neurological Surgery

## 2021-03-30 ENCOUNTER — Other Ambulatory Visit: Payer: Self-pay

## 2021-03-30 DIAGNOSIS — Z01812 Encounter for preprocedural laboratory examination: Secondary | ICD-10-CM | POA: Insufficient documentation

## 2021-03-30 DIAGNOSIS — Z20822 Contact with and (suspected) exposure to covid-19: Secondary | ICD-10-CM | POA: Diagnosis not present

## 2021-03-30 LAB — SARS CORONAVIRUS 2 (TAT 6-24 HRS): SARS Coronavirus 2: NEGATIVE

## 2021-03-30 NOTE — Progress Notes (Signed)
Spoke with [name] and her daughter Tye [place] for pre-op call. [name] is extremely hard of hearing. [name] denies cardiac history, but has had 2 TIA's in the past. [name] states she is not diabetic.   [name] will go for a Covd test today. Instructed her to wear a mask if she is out in public and around people that she does not live with.   [name] will need a lot of assistance in the AM. [name]'s daughter will be in the waiting area if she needs to come back to the pre-op room.

## 2021-03-31 ENCOUNTER — Ambulatory Visit (HOSPITAL_COMMUNITY): Payer: Medicare Other | Admitting: Certified Registered Nurse Anesthetist

## 2021-03-31 ENCOUNTER — Encounter (HOSPITAL_COMMUNITY): Payer: Self-pay | Admitting: Neurological Surgery

## 2021-03-31 ENCOUNTER — Ambulatory Visit (HOSPITAL_COMMUNITY): Payer: Medicare Other

## 2021-03-31 ENCOUNTER — Observation Stay (HOSPITAL_COMMUNITY)
Admission: RE | Admit: 2021-03-31 | Discharge: 2021-04-01 | Disposition: A | Discharge status: Home / Self Care | Payer: Medicare Other | Attending: Neurological Surgery | Admitting: Neurological Surgery

## 2021-03-31 ENCOUNTER — Encounter (HOSPITAL_COMMUNITY)
Admission: RE | Disposition: A | Discharge status: Home / Self Care | Payer: Self-pay | Source: Home / Self Care | Attending: Neurological Surgery

## 2021-03-31 DIAGNOSIS — Z96641 Presence of right artificial hip joint: Secondary | ICD-10-CM | POA: Insufficient documentation

## 2021-03-31 DIAGNOSIS — Z85819 Personal history of malignant neoplasm of unspecified site of lip, oral cavity, and pharynx: Secondary | ICD-10-CM | POA: Diagnosis not present

## 2021-03-31 DIAGNOSIS — M5116 Intervertebral disc disorders with radiculopathy, lumbar region: Secondary | ICD-10-CM | POA: Diagnosis not present

## 2021-03-31 DIAGNOSIS — Z79899 Other long term (current) drug therapy: Secondary | ICD-10-CM | POA: Diagnosis not present

## 2021-03-31 DIAGNOSIS — I10 Essential (primary) hypertension: Secondary | ICD-10-CM | POA: Diagnosis not present

## 2021-03-31 DIAGNOSIS — Z96652 Presence of left artificial knee joint: Secondary | ICD-10-CM | POA: Diagnosis not present

## 2021-03-31 DIAGNOSIS — M48061 Spinal stenosis, lumbar region without neurogenic claudication: Secondary | ICD-10-CM | POA: Insufficient documentation

## 2021-03-31 DIAGNOSIS — Z8673 Personal history of transient ischemic attack (TIA), and cerebral infarction without residual deficits: Secondary | ICD-10-CM | POA: Insufficient documentation

## 2021-03-31 DIAGNOSIS — Z419 Encounter for procedure for purposes other than remedying health state, unspecified: Secondary | ICD-10-CM

## 2021-03-31 DIAGNOSIS — Z7982 Long term (current) use of aspirin: Secondary | ICD-10-CM | POA: Insufficient documentation

## 2021-03-31 DIAGNOSIS — M25552 Pain in left hip: Secondary | ICD-10-CM | POA: Diagnosis present

## 2021-03-31 DIAGNOSIS — M5126 Other intervertebral disc displacement, lumbar region: Secondary | ICD-10-CM | POA: Diagnosis not present

## 2021-03-31 DIAGNOSIS — Z8616 Personal history of COVID-19: Secondary | ICD-10-CM | POA: Insufficient documentation

## 2021-03-31 DIAGNOSIS — Z9889 Other specified postprocedural states: Secondary | ICD-10-CM

## 2021-03-31 DIAGNOSIS — E876 Hypokalemia: Secondary | ICD-10-CM | POA: Diagnosis not present

## 2021-03-31 DIAGNOSIS — D649 Anemia, unspecified: Secondary | ICD-10-CM | POA: Diagnosis not present

## 2021-03-31 HISTORY — DX: Cerebral infarction, unspecified: I63.9

## 2021-03-31 HISTORY — DX: Anemia, unspecified: D64.9

## 2021-03-31 HISTORY — PX: LUMBAR LAMINECTOMY/DECOMPRESSION MICRODISCECTOMY: SHX5026

## 2021-03-31 LAB — BASIC METABOLIC PANEL
Anion gap: 9 (ref 5–15)
BUN: 10 mg/dL (ref 8–23)
CO2: 25 mmol/L (ref 22–32)
Calcium: 9.2 mg/dL (ref 8.9–10.3)
Chloride: 93 mmol/L — ABNORMAL LOW (ref 98–111)
Creatinine, Ser: 0.57 mg/dL (ref 0.44–1.00)
GFR, Estimated: >60 mL/min (ref >60)
Glucose, Bld: 105 mg/dL — ABNORMAL HIGH (ref 70–99)
Potassium: 3.6 mmol/L (ref 3.5–5.1)
Sodium: 127 mmol/L — ABNORMAL LOW (ref 135–145)

## 2021-03-31 LAB — CBC WITH DIFFERENTIAL/PLATELET
Abs Immature Granulocytes: 0.02 10*3/uL (ref 0.00–0.07)
Basophils Absolute: 0.1 10*3/uL (ref 0.0–0.1)
Basophils Relative: 1 %
Eosinophils Absolute: 0.2 10*3/uL (ref 0.0–0.5)
Eosinophils Relative: 3 %
HCT: 29.7 % — ABNORMAL LOW (ref 36.0–46.0)
Hemoglobin: 10 g/dL — ABNORMAL LOW (ref 12.0–15.0)
Immature Granulocytes: 0 %
Lymphocytes Relative: 20 %
Lymphs Abs: 1.4 10*3/uL (ref 0.7–4.0)
MCH: 29.2 pg (ref 26.0–34.0)
MCHC: 33.7 g/dL (ref 30.0–36.0)
MCV: 86.6 fL (ref 80.0–100.0)
Monocytes Absolute: 0.7 10*3/uL (ref 0.1–1.0)
Monocytes Relative: 10 %
Neutro Abs: 4.5 10*3/uL (ref 1.7–7.7)
Neutrophils Relative %: 66 %
Platelets: 423 10*3/uL — ABNORMAL HIGH (ref 150–400)
RBC: 3.43 MIL/uL — ABNORMAL LOW (ref 3.87–5.11)
RDW: 14.2 % (ref 11.5–15.5)
WBC: 6.9 10*3/uL (ref 4.0–10.5)
nRBC: 0 % (ref 0.0–0.2)

## 2021-03-31 LAB — PROTIME-INR
INR: 1 (ref 0.8–1.2)
Prothrombin Time: 12.8 seconds (ref 11.4–15.2)

## 2021-03-31 LAB — SURGICAL PCR SCREEN
MRSA, PCR: NEGATIVE
Staphylococcus aureus: NEGATIVE

## 2021-03-31 SURGERY — LUMBAR LAMINECTOMY/DECOMPRESSION MICRODISCECTOMY 1 LEVEL
Anesthesia: General | Site: Spine Lumbar | Laterality: Left

## 2021-03-31 MED ORDER — PROPOFOL 10 MG/ML IV BOLUS
INTRAVENOUS | Status: AC
  Filled 2021-03-31: qty 20

## 2021-03-31 MED ORDER — ACETAMINOPHEN 325 MG PO TABS: 650.0000 mg | ORAL_TABLET | ORAL | Status: DC | PRN

## 2021-03-31 MED ORDER — CHLORHEXIDINE GLUCONATE 0.12 % MT SOLN
15.0000 mL | Freq: Once | OROMUCOSAL | Status: AC
  Administered 2021-03-31: 15 mL via OROMUCOSAL
  Filled 2021-03-31: qty 15

## 2021-03-31 MED ORDER — ROCURONIUM BROMIDE 10 MG/ML (PF) SYRINGE
PREFILLED_SYRINGE | INTRAVENOUS | Status: DC | PRN
  Administered 2021-03-31: 60 mg via INTRAVENOUS

## 2021-03-31 MED ORDER — PROPOFOL 10 MG/ML IV BOLUS
INTRAVENOUS | Status: DC | PRN
  Administered 2021-03-31: 120 mg via INTRAVENOUS

## 2021-03-31 MED ORDER — SODIUM CHLORIDE 0.9% FLUSH: 3.0000 mL | Freq: Two times a day (BID) | INTRAVENOUS | Status: DC

## 2021-03-31 MED ORDER — ORAL CARE MOUTH RINSE: 15.0000 mL | Freq: Once | OROMUCOSAL | Status: AC

## 2021-03-31 MED ORDER — POTASSIUM CHLORIDE IN NACL 20-0.9 MEQ/L-% IV SOLN: INTRAVENOUS | Status: DC

## 2021-03-31 MED ORDER — ONDANSETRON HCL 4 MG PO TABS: 4.0000 mg | ORAL_TABLET | Freq: Four times a day (QID) | ORAL | Status: DC | PRN

## 2021-03-31 MED ORDER — THROMBIN 5000 UNITS EX SOLR
CUTANEOUS | Status: AC
  Filled 2021-03-31: qty 5000

## 2021-03-31 MED ORDER — SENNA 8.6 MG PO TABS
1.0000 | ORAL_TABLET | Freq: Two times a day (BID) | ORAL | Status: DC
  Administered 2021-03-31 – 2021-04-01 (×2): 8.6 mg via ORAL
  Filled 2021-03-31 (×2): qty 1

## 2021-03-31 MED ORDER — LIDOCAINE 2% (20 MG/ML) 5 ML SYRINGE
INTRAMUSCULAR | Status: DC | PRN
  Administered 2021-03-31: 40 mg via INTRAVENOUS

## 2021-03-31 MED ORDER — SODIUM CHLORIDE 0.9% FLUSH: 3.0000 mL | INTRAVENOUS | Status: DC | PRN

## 2021-03-31 MED ORDER — PHENYLEPHRINE HCL-NACL 10-0.9 MG/250ML-% IV SOLN
INTRAVENOUS | Status: DC | PRN
  Administered 2021-03-31: 25 ug/min via INTRAVENOUS

## 2021-03-31 MED ORDER — FENTANYL CITRATE (PF) 100 MCG/2ML IJ SOLN
25.0000 ug | INTRAMUSCULAR | Status: DC | PRN
  Administered 2021-03-31 (×2): 50 ug via INTRAVENOUS

## 2021-03-31 MED ORDER — BUPIVACAINE HCL (PF) 0.25 % IJ SOLN
INTRAMUSCULAR | Status: AC | PRN
  Administered 2021-03-31: 3 mL/h

## 2021-03-31 MED ORDER — PHENOL 1.4 % MT LIQD: 1.0000 | OROMUCOSAL | Status: DC | PRN

## 2021-03-31 MED ORDER — THROMBIN 5000 UNITS EX SOLR: OROMUCOSAL | Status: DC | PRN

## 2021-03-31 MED ORDER — THROMBIN 5000 UNITS EX SOLR: CUTANEOUS | Status: DC | PRN

## 2021-03-31 MED ORDER — VANCOMYCIN HCL 1000 MG IV SOLR
INTRAVENOUS | Status: DC | PRN
  Administered 2021-03-31: 1000 mg via INTRAVENOUS

## 2021-03-31 MED ORDER — ROCURONIUM BROMIDE 10 MG/ML (PF) SYRINGE
PREFILLED_SYRINGE | INTRAVENOUS | Status: AC
  Filled 2021-03-31: qty 10

## 2021-03-31 MED ORDER — MORPHINE SULFATE (PF) 2 MG/ML IV SOLN
2.0000 mg | INTRAVENOUS | Status: DC | PRN
Start: 2021-03-31 — End: 2021-04-01

## 2021-03-31 MED ORDER — ONDANSETRON HCL 4 MG/2ML IJ SOLN
INTRAMUSCULAR | Status: AC
  Filled 2021-03-31: qty 2

## 2021-03-31 MED ORDER — BUPIVACAINE HCL (PF) 0.25 % IJ SOLN
INTRAMUSCULAR | Status: AC
  Filled 2021-03-31: qty 30

## 2021-03-31 MED ORDER — HYDROCHLOROTHIAZIDE 25 MG PO TABS
25.0000 mg | ORAL_TABLET | Freq: Every day | ORAL | Status: DC
  Administered 2021-03-31 – 2021-04-01 (×2): 25 mg via ORAL
  Filled 2021-03-31 (×2): qty 1

## 2021-03-31 MED ORDER — LACTATED RINGERS IV SOLN: INTRAVENOUS | Status: DC

## 2021-03-31 MED ORDER — VITAMIN D 25 MCG (1000 UNIT) PO TABS
4000.0000 [IU] | ORAL_TABLET | Freq: Every day | ORAL | Status: DC
  Administered 2021-03-31 – 2021-04-01 (×2): 4000 [IU] via ORAL
  Filled 2021-03-31 (×4): qty 4

## 2021-03-31 MED ORDER — FENTANYL CITRATE (PF) 250 MCG/5ML IJ SOLN
INTRAMUSCULAR | Status: DC | PRN
  Administered 2021-03-31: 50 ug via INTRAVENOUS
  Administered 2021-03-31: 25 ug via INTRAVENOUS
  Administered 2021-03-31 (×2): 50 ug via INTRAVENOUS

## 2021-03-31 MED ORDER — CELECOXIB 200 MG PO CAPS
200.0000 mg | ORAL_CAPSULE | Freq: Two times a day (BID) | ORAL | Status: DC
  Administered 2021-03-31 – 2021-04-01 (×2): 200 mg via ORAL
  Filled 2021-03-31 (×2): qty 1

## 2021-03-31 MED ORDER — HEMOSTATIC AGENTS (NO CHARGE) OPTIME
TOPICAL | Status: DC | PRN
  Administered 2021-03-31: 1 via TOPICAL

## 2021-03-31 MED ORDER — VANCOMYCIN HCL 750 MG/150ML IV SOLN
750.0000 mg | Freq: Once | INTRAVENOUS | Status: AC
  Administered 2021-04-01: 750 mg via INTRAVENOUS
  Filled 2021-03-31: qty 150

## 2021-03-31 MED ORDER — SODIUM CHLORIDE 0.9 % IV SOLN: 250.0000 mL | INTRAVENOUS | Status: DC

## 2021-03-31 MED ORDER — DEXAMETHASONE SODIUM PHOSPHATE 10 MG/ML IJ SOLN
INTRAMUSCULAR | Status: AC
  Filled 2021-03-31: qty 1

## 2021-03-31 MED ORDER — AMISULPRIDE (ANTIEMETIC) 5 MG/2ML IV SOLN: 10.0000 mg | Freq: Once | INTRAVENOUS | Status: DC | PRN

## 2021-03-31 MED ORDER — 0.9 % SODIUM CHLORIDE (POUR BTL) OPTIME
TOPICAL | Status: DC | PRN
  Administered 2021-03-31: 1000 mL

## 2021-03-31 MED ORDER — ACETAMINOPHEN 500 MG PO TABS: 1000.0000 mg | ORAL_TABLET | Freq: Once | ORAL | Status: DC

## 2021-03-31 MED ORDER — ONDANSETRON HCL 4 MG/2ML IJ SOLN: 4.0000 mg | Freq: Four times a day (QID) | INTRAMUSCULAR | Status: DC | PRN

## 2021-03-31 MED ORDER — MENTHOL 3 MG MT LOZG: 1.0000 | LOZENGE | OROMUCOSAL | Status: DC | PRN

## 2021-03-31 MED ORDER — VANCOMYCIN HCL IN DEXTROSE 1-5 GM/200ML-% IV SOLN
INTRAVENOUS | Status: AC
  Administered 2021-03-31: 5 mg via INTRAVENOUS
  Filled 2021-03-31: qty 200

## 2021-03-31 MED ORDER — ACETAMINOPHEN 650 MG RE SUPP: 650.0000 mg | RECTAL | Status: DC | PRN

## 2021-03-31 MED ORDER — METHOCARBAMOL 1000 MG/10ML IJ SOLN
500.0000 mg | Freq: Four times a day (QID) | INTRAVENOUS | Status: DC | PRN
  Filled 2021-03-31: qty 5

## 2021-03-31 MED ORDER — METHOCARBAMOL 500 MG PO TABS
500.0000 mg | ORAL_TABLET | Freq: Four times a day (QID) | ORAL | Status: DC | PRN
  Administered 2021-03-31 – 2021-04-01 (×4): 500 mg via ORAL
  Filled 2021-03-31 (×4): qty 1

## 2021-03-31 MED ORDER — ONDANSETRON HCL 4 MG/2ML IJ SOLN
INTRAMUSCULAR | Status: DC | PRN
  Administered 2021-03-31: 4 mg via INTRAVENOUS

## 2021-03-31 MED ORDER — DEXAMETHASONE SODIUM PHOSPHATE 4 MG/ML IJ SOLN
4.0000 mg | Freq: Four times a day (QID) | INTRAMUSCULAR | Status: DC
  Administered 2021-03-31 – 2021-04-01 (×2): 4 mg via INTRAVENOUS
  Filled 2021-03-31 (×2): qty 1

## 2021-03-31 MED ORDER — HYDROCODONE-ACETAMINOPHEN 5-325 MG PO TABS
1.0000 | ORAL_TABLET | Freq: Four times a day (QID) | ORAL | Status: DC | PRN
  Administered 2021-03-31 – 2021-04-01 (×4): 1 via ORAL
  Filled 2021-03-31 (×4): qty 1

## 2021-03-31 MED ORDER — FENTANYL CITRATE (PF) 250 MCG/5ML IJ SOLN
INTRAMUSCULAR | Status: AC
  Filled 2021-03-31: qty 5

## 2021-03-31 MED ORDER — DEXAMETHASONE SODIUM PHOSPHATE 10 MG/ML IJ SOLN
INTRAMUSCULAR | Status: DC | PRN
  Administered 2021-03-31: 10 mg via INTRAVENOUS

## 2021-03-31 MED ORDER — FENTANYL CITRATE (PF) 100 MCG/2ML IJ SOLN
INTRAMUSCULAR | Status: AC
  Filled 2021-03-31: qty 2

## 2021-03-31 MED ORDER — SUGAMMADEX SODIUM 200 MG/2ML IV SOLN
INTRAVENOUS | Status: DC | PRN
  Administered 2021-03-31: 100 mg via INTRAVENOUS

## 2021-03-31 MED ORDER — LIDOCAINE 2% (20 MG/ML) 5 ML SYRINGE
INTRAMUSCULAR | Status: AC
  Filled 2021-03-31: qty 5

## 2021-03-31 MED ORDER — DEXAMETHASONE 4 MG PO TABS
4.0000 mg | ORAL_TABLET | Freq: Four times a day (QID) | ORAL | Status: DC
  Administered 2021-03-31: 4 mg via ORAL
  Filled 2021-03-31: qty 1

## 2021-03-31 SURGICAL SUPPLY — 40 items
BAG COUNTER SPONGE SURGICOUNT (BAG) ×2 IMPLANT
BAND RUBBER #18 3X1/16 STRL (MISCELLANEOUS) ×4 IMPLANT
BENZOIN TINCTURE PRP APPL 2/3 (GAUZE/BANDAGES/DRESSINGS) ×2 IMPLANT
BUR CARBIDE MATCH 3.0 (BURR) ×2 IMPLANT
CANISTER SUCT 3000ML PPV (MISCELLANEOUS) ×2 IMPLANT
DRAPE LAPAROTOMY 100X72X124 (DRAPES) ×2 IMPLANT
DRAPE MICROSCOPE LEICA (MISCELLANEOUS) ×2 IMPLANT
DRAPE SURG 17X23 STRL (DRAPES) ×2 IMPLANT
DRSG OPSITE POSTOP 3X4 (GAUZE/BANDAGES/DRESSINGS) ×2 IMPLANT
DURAPREP 26ML APPLICATOR (WOUND CARE) ×2 IMPLANT
ELECT REM PT RETURN 9FT ADLT (ELECTROSURGICAL) ×2
ELECTRODE REM PT RTRN 9FT ADLT (ELECTROSURGICAL) ×1 IMPLANT
GAUZE 4X4 16PLY ~~LOC~~+RFID DBL (SPONGE) ×2 IMPLANT
GLOVE SURG ENC MOIS LTX SZ7 (GLOVE) ×4 IMPLANT
GLOVE SURG ENC MOIS LTX SZ8 (GLOVE) ×2 IMPLANT
GLOVE SURG UNDER POLY LF SZ6.5 (GLOVE) ×4 IMPLANT
GLOVE SURG UNDER POLY LF SZ7 (GLOVE) ×4 IMPLANT
GOWN STRL REUS W/ TWL LRG LVL3 (GOWN DISPOSABLE) ×1 IMPLANT
GOWN STRL REUS W/ TWL XL LVL3 (GOWN DISPOSABLE) ×1 IMPLANT
GOWN STRL REUS W/TWL 2XL LVL3 (GOWN DISPOSABLE) ×2 IMPLANT
GOWN STRL REUS W/TWL LRG LVL3 (GOWN DISPOSABLE) ×2
GOWN STRL REUS W/TWL XL LVL3 (GOWN DISPOSABLE) ×2
HEMOSTAT POWDER KIT SURGIFOAM (HEMOSTASIS) ×2 IMPLANT
KIT BASIN OR (CUSTOM PROCEDURE TRAY) ×2 IMPLANT
KIT TURNOVER KIT B (KITS) ×2 IMPLANT
NEEDLE HYPO 25X1 1.5 SAFETY (NEEDLE) ×2 IMPLANT
NEEDLE SPNL 20GX3.5 QUINCKE YW (NEEDLE) ×2 IMPLANT
NS IRRIG 1000ML POUR BTL (IV SOLUTION) ×2 IMPLANT
PACK LAMINECTOMY NEURO (CUSTOM PROCEDURE TRAY) ×2 IMPLANT
PAD ARMBOARD 7.5X6 YLW CONV (MISCELLANEOUS) ×6 IMPLANT
SPONGE SURGIFOAM ABS GEL SZ50 (HEMOSTASIS) ×2 IMPLANT
SPONGE T-LAP 4X18 ~~LOC~~+RFID (SPONGE) ×4 IMPLANT
STRIP CLOSURE SKIN 1/2X4 (GAUZE/BANDAGES/DRESSINGS) ×2 IMPLANT
SUT VIC AB 0 CT1 18XCR BRD8 (SUTURE) ×1 IMPLANT
SUT VIC AB 0 CT1 8-18 (SUTURE) ×2
SUT VIC AB 2-0 CP2 18 (SUTURE) ×2 IMPLANT
SUT VIC AB 3-0 SH 8-18 (SUTURE) ×2 IMPLANT
TOWEL GREEN STERILE (TOWEL DISPOSABLE) ×2 IMPLANT
TOWEL GREEN STERILE FF (TOWEL DISPOSABLE) ×2 IMPLANT
WATER STERILE IRR 1000ML POUR (IV SOLUTION) ×2 IMPLANT

## 2021-03-31 NOTE — Transfer of Care (Signed)
Immediate Anesthesia Transfer of Care Note  Patient: [name]  Procedure(s) Performed: Microdiscectomy - Lumbar one-Lumbar two - left (Left: Spine Lumbar)  Patient Location: PACU  Anesthesia Type:General  Level of Consciousness: drowsy and patient cooperative  Airway & Oxygen Therapy: Patient Spontanous Breathing  Post-op Assessment: Report given to RN and Post -op Vital signs reviewed and stable  Post vital signs: Reviewed and stable  Last Vitals:  Vitals Value Taken Time  BP 168/68 03/31/21 1533  Temp    Pulse 83 03/31/21 1535  Resp 21 03/31/21 1535  SpO2 99 % 03/31/21 1535  Vitals shown include unvalidated device data.  Last Pain:  Vitals:   03/31/21 1034  TempSrc:   PainSc: 10-Worst pain ever      Patients Stated Pain Goal: 3 (13/14/38 8875)  Complications: No notable events documented.

## 2021-03-31 NOTE — H&P (Signed)
Subjective: Patient is a 87 y.o. female admitted for L hip pain Onset of symptoms was several months ago, rapidly worsening since that time.  The pain is rated intense, unremitting, and is located at the across the lower back and radiates to L hip and thigh. The pain is described as aching and occurs all day. The symptoms have been progressive. Symptoms are exacerbated by coughing, exercise, and standing. 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"   Stroke [facility])    TIA x 2   TIA (transient ischemic attack)    per ECHO 2008    Past Surgical History:  Procedure Laterality Date   25 GAUGE PARS PLANA VITRECTOMY WITH 20 GAUGE MVR PORT Left 09/01/2015   25 GAUGE PARS PLANA VITRECTOMY WITH 20 GAUGE MVR PORT Left 09/01/2015   Procedure: 25 GAUGE PARS PLANA VITRECTOMY WITH 20 GAUGE MVR PORT;  Surgeon: Hayden Pedro, MD;  Location: [place];  Service: Ophthalmology;  Laterality: Left;   AIR/FLUID EXCHANGE Left 09/01/2015   Procedure: AIR/FLUID EXCHANGE;  Surgeon: Hayden Pedro, MD;  Location: [place];  Service: Ophthalmology;  Laterality: Left;   APPENDECTOMY  1970   BREAST CYST EXCISION Bilateral 1960's   CATARACT EXTRACTION W/ INTRAOCULAR LENS  IMPLANT, BILATERAL  2005   EYE SURGERY     JOINT REPLACEMENT     LAPAROSCOPIC CHOLECYSTECTOMY  2000's   LASER PHOTO ABLATION Left 09/01/2015   Procedure: LASER PHOTO ABLATION;  Surgeon: Hayden Pedro, MD;  Location: [place];  Service: Ophthalmology;  Laterality: Left;  Endo laser   MEMBRANE PEEL Left 09/01/2015   Procedure: MEMBRANE PEEL;  Surgeon: Hayden Pedro, MD;  Location: [place];   Service: Ophthalmology;  Laterality: Left;   MOHS SURGERY Left ~ 2010   lower lip   RETINAL DETACHMENT SURGERY Left 2011   TOTAL HIP ARTHROPLASTY  07/09/2012   Procedure: TOTAL HIP ARTHROPLASTY;  Surgeon: Gearlean Alf, MD;  Location: [facility];  Service: Orthopedics;  Laterality: Right;   TOTAL KNEE ARTHROPLASTY  2006   TOTAL KNEE ARTHROPLASTY Left 10/02/2017   Procedure: LEFT TOTAL KNEE ARTHROPLASTY;  Surgeon: Gaynelle Arabian, MD;  Location[facility];  Service: Orthopedics;  Laterality: Left;  Adductor Block   TUBAL LIGATION     VAGINAL HYSTERECTOMY  1984    Prior to Admission medications   Medication Sig Start Date End Date Taking? Authorizing Provider  acetaminophen (TYLENOL) 500 MG tablet Take 1,000 mg by mouth every 6 (six) hours as needed for mild pain.   Yes [provider]  Ascorbic Acid (VITAMIN C) 500 MG/5ML LIQD Take 500 mg by mouth daily.   Yes [provider]  fluocinonide gel (LIDEX) 8.29 % Apply 1 application topically at bedtime as needed (mouth sores). Orally   Yes [provider]  fluticasone (FLONASE) 50 MCG/ACT nasal spray Place 1 spray into both nostrils at bedtime.   Yes [provider]  hydrochlorothiazide (HYDRODIURIL) 25 MG tablet Take 25 mg by  mouth daily.   Yes [provider]  HYDROcodone-acetaminophen (NORCO/VICODIN) 5-325 MG tablet Take 1 tablet by mouth 4 (four) times daily as needed for severe pain. 03/10/21  Yes [provider]  OVER THE COUNTER MEDICATION Apply 1 application topically daily as needed (pain). CBD Cream   Yes [provider]  polyethylene glycol (MIRALAX / GLYCOLAX) 17 g packet Take 17 g by mouth daily.   Yes [provider]  Propylene Glycol (SYSTANE BALANCE OP) Place 1 drop into both eyes 4 (four) times daily.    Yes [provider]  silver sulfADIAZINE (SILVADENE) 1 % cream Apply 1 application topically daily as needed (leg ulcers).   Yes [provider]   VITAMIN D PO Take 4,000 Units by mouth daily. Liquid form   Yes [provider]  aspirin EC 81 MG tablet Take 81 mg by mouth daily. Swallow whole.    [provider]  ibuprofen (ADVIL) 100 MG/5ML suspension Take 300 mg by mouth every 4 (four) hours as needed for moderate pain.    [provider]   Allergies  Allergen Reactions   Oxycodone Other (See Comments)    " my children said I went crazy ! "   Tape Other (See Comments)    Pulls skin off of legs. Tissue paper skin on legs   Keflex [Cephalexin] Hives and Rash    Social History   Tobacco Use   Smoking status: Never   Smokeless tobacco: Never  Substance Use Topics   Alcohol use: No    Family History  Problem Relation Age of Onset   Cancer Father    Heart attack Mother      Review of Systems  Positive ROS: neg  All other systems have been reviewed and were otherwise negative with the exception of those mentioned in the HPI and as above.  Objective: Vital signs in last 24 hours: Temp:  [97.7 F (36.5 C)] 97.7 F (36.5 C) (07/06 0952) Pulse Rate:  [80] 80 (07/06 0952) Resp:  [18] 18 (07/06 0952) BP: (198)/(74) 198/74 (07/06 0952) SpO2:  [96 %] 96 % (07/06 0952) Weight:  [49.9 kg] 49.9 kg (07/06 0952)  General Appearance: Alert, cooperative, no distress, appears stated age Head: Normocephalic, without obvious abnormality, atraumatic Eyes: PERRL, conjunctiva/corneas clear, EOM's intact    Neck: Supple, symmetrical, trachea midline Back: Symmetric, no curvature, ROM normal, no CVA tenderness Lungs:  respirations unlabored Heart: Regular rate and rhythm Abdomen: Soft, non-tender Extremities: Extremities normal, atraumatic, no cyanosis or edema Pulses: 2+ and symmetric all extremities Skin: Skin color, texture, turgor normal, no rashes or lesions  NEUROLOGIC:   Mental status: Alert and oriented x4,  no aphasia, good attention span, fund of knowledge, and memory Motor Exam - grossly  normal Sensory Exam - grossly normal Reflexes: 1= Coordination - grossly normal Gait - grossly normal Balance - grossly normal Cranial Nerves: I: smell Not tested  II: visual acuity  OS: nl    OD: nl  II: visual fields Full to confrontation  II: pupils Equal, round, reactive to light  III,VII: ptosis None  III,IV,VI: extraocular muscles  Full ROM  V: mastication Normal  V: facial light touch sensation  Normal  V,VII: corneal reflex  Present  VII: facial muscle function - upper  Normal  VII: facial muscle function - lower Normal  VIII: hearing Not tested  IX: soft palate elevation  Normal  IX,X: gag reflex Present  XI: trapezius strength  5/5  XI: sternocleidomastoid  strength 5/5  XI: neck flexion strength  5/5  XII: tongue strength  Normal    Data Review Lab Results  Component Value Date   WBC 6.9 03/31/2021   HGB 10.0 (L) 03/31/2021   HCT 29.7 (L) 03/31/2021   MCV 86.6 03/31/2021   PLT 423 (H) 03/31/2021   Lab Results  Component Value Date   NA 127 (L) 03/31/2021   K 3.6 03/31/2021   CL 93 (L) 03/31/2021   CO2 25 03/31/2021   BUN 10 03/31/2021   CREATININE 0.57 03/31/2021   GLUCOSE 105 (H) 03/31/2021   Lab Results  Component Value Date   INR 1.0 03/31/2021    Assessment/Plan:  Estimated body mass index is 21.48 kg/m as calculated from the following:   Height as of this encounter: 5' (1.524 m).   Weight as of this encounter: 49.9 kg. Patient admitted for L L1-2 Microdiskectomy. Patient has failed a reasonable attempt at conservative therapy.  I explained the condition and procedure to the patient and answered any questions.  Patient wishes to proceed with procedure as planned. Understands risks/ benefits and typical outcomes of procedure.   Eustace Moore 03/31/2021 12:54 PM

## 2021-03-31 NOTE — Anesthesia Preprocedure Evaluation (Addendum)
Anesthesia Evaluation  Patient identified by MRN, date of birth, ID band Patient awake    Reviewed: Allergy & Precautions, NPO status , Patient's Chart, lab work & pertinent test results  History of Anesthesia Complications (+) PONV  Airway Mallampati: II  TM Distance: >3 FB     Dental  (+) Dental Advisory Given   Pulmonary neg pulmonary ROS,    breath sounds clear to auscultation       Cardiovascular hypertension, Pt. on medications  Rhythm:Regular Rate:Normal     Neuro/Psych TIACVA    GI/Hepatic negative GI ROS, Neg liver ROS,   Endo/Other  negative endocrine ROS  Renal/GU negative Renal ROS     Musculoskeletal  (+) Arthritis ,   Abdominal   Peds  Hematology negative hematology ROS (+)   Anesthesia Other Findings   Reproductive/Obstetrics                             Lab Results  Component Value Date   WBC 6.9 03/31/2021   HGB 10.0 (L) 03/31/2021   HCT 29.7 (L) 03/31/2021   MCV 86.6 03/31/2021   PLT 423 (H) 03/31/2021   Lab Results  Component Value Date   CREATININE 0.57 03/31/2021   BUN 10 03/31/2021   NA 127 (L) 03/31/2021   K 3.6 03/31/2021   CL 93 (L) 03/31/2021   CO2 25 03/31/2021    Anesthesia Physical Anesthesia Plan  ASA: 3  Anesthesia Plan: General   Post-op Pain Management:    Induction: Intravenous  PONV Risk Score and Plan: 4 or greater and Dexamethasone, Ondansetron and Treatment may vary due to age or medical condition  Airway Management Planned: Oral ETT  Additional Equipment: None  Intra-op Plan:   Post-operative Plan: Extubation in OR  Informed Consent: I have reviewed the patients History and Physical, chart, labs and discussed the procedure including the risks, benefits and alternatives for the proposed anesthesia with the patient or authorized representative who has indicated his/her understanding and acceptance.     Dental advisory  given  Plan Discussed with: CRNA  Anesthesia Plan Comments:         Anesthesia Quick Evaluation

## 2021-03-31 NOTE — Op Note (Signed)
03/31/2021  3:19 PM  PATIENT:  [name]  87 y.o. female  PRE-OPERATIVE DIAGNOSIS: Large lumbar disc herniation L1-2 with spinal stenosis and left lower extremity radiculopathy  POST-OPERATIVE DIAGNOSIS:  same  PROCEDURE: Left L1-2 hemilaminectomy medial facetectomy and foraminotomies for decompression followed by microdiscectomy utilizing microscopic dissection  SURGEON:  Sherley Bounds, MD  ASSISTANTS: Margo Aye FNP  ANESTHESIA:   General  EBL: 100 ml  Total I/O In: 1250 [I.V.:1000; IV Piggyback:250] Out: 100 [Blood:100]  BLOOD ADMINISTERED: none  DRAINS: None N  SPECIMEN:  none  INDICATION FOR PROCEDURE: This patient presented with severe left leg pain. Imaging showed large disc herniation L1-2 on the left with spinal stenosis. The patient tried conservative measures without relief. Pain was debilitating. Recommended decompressive hemilaminectomy with microdiscectomy. Patient understood the risks, benefits, and alternatives and potential outcomes and wished to proceed.  PROCEDURE DETAILS: The patient was taken to the operating room and after induction of adequate generalized endotracheal anesthesia, the patient was rolled into the prone position on chest rolls and all pressure points were padded.  Her right arm was kept by her side because of her shoulder.  The lumbar region was cleaned and then prepped with DuraPrep and draped in the usual sterile fashion. 5 cc of local anesthesia was injected and then a dorsal midline incision was made and carried down to the lumbo sacral fascia. The fascia was opened and the paraspinous musculature was taken down in a subperiosteal fashion to expose L1-2 on the left. Intraoperative x-ray confirmed my level, and then I used a combination of the high-speed drill and the Kerrison punches to perform a hemilaminectomy, medial facetectomy, and foraminotomy at L1-2 on the left. The underlying yellow ligament was opened and removed in a piecemeal  fashion to expose the underlying dura and exiting nerve root. I undercut the lateral recess and dissected down until I was medial to and distal to the pedicle. The nerve root was well decompressed.  We continued to drill medially along the medial facet and undercut this until we could get lateral to the dura just above the nerve root.  We then brought in the microscope and used microscopic dissection for the remainder of the surgery.  We able to find a large disc herniation underneath the lateral recess and midline and L2 nerve root.  We made a small hole in the capsule of the disc herniation and then remove large amounts of disc herniation from both above and below the disc space with a nerve hook and coronary dilator.  We removed disc from the midline both superior and inferior to the disc space.  We were distal to the L2 pedicle.  We also could fill the L1 pedicle.  We removed a very large amount of degenerated disc herniation.  I then palpated with a coronary dilator along the nerve root and into the foramen to assure adequate decompression. I felt no more compression of the nerve root. I irrigated with saline solution containing bacitracin. Achieved hemostasis with bipolar cautery, lined the dura with Gelfoam, and then closed the fascia with 0 Vicryl. I closed the subcutaneous tissues with 2-0 Vicryl and the subcuticular tissues with 3-0 Vicryl. The skin was then closed with benzoin and Steri-Strips. The drapes were removed, a sterile dressing was applied.  My nurse practitioner was involved in the exposure, safe retraction of the neural elements, the disc work and the closure. the patient was awakened from general anesthesia and transferred to the recovery room in stable  condition. At the end of the procedure all sponge, needle and instrument counts were correct.    PLAN OF CARE: Admit for overnight observation  PATIENT DISPOSITION:  PACU - hemodynamically stable.   Delay start of Pharmacological VTE  agent (>24hrs) due to surgical blood loss or risk of bleeding:  yes

## 2021-03-31 NOTE — Anesthesia Procedure Notes (Signed)
Procedure Name: Intubation Date/Time: 03/31/2021 1:21 PM Performed by: Dorthea Cove, CRNA Pre-anesthesia Checklist: Patient identified, Emergency Drugs available, Suction available and Patient being monitored Patient Re-evaluated:Patient Re-evaluated prior to induction Oxygen Delivery Method: Circle system utilized Preoxygenation: Pre-oxygenation with 100% oxygen Induction Type: IV induction Ventilation: Mask ventilation without difficulty Laryngoscope Size: Mac and 3 Grade View: Grade I Tube type: Oral Tube size: 7.0 mm Number of attempts: 1 Airway Equipment and Method: Stylet and Oral airway Placement Confirmation: ETT inserted through vocal cords under direct vision, positive ETCO2 and breath sounds checked- equal and bilateral Secured at: 21 cm Tube secured with: Tape Dental Injury: Teeth and Oropharynx as per pre-operative assessment

## 2021-03-31 NOTE — Progress Notes (Addendum)
Pharmacy Antibiotic Note  [name] is a 87 y.o. female admitted on 03/31/2021 with lumbar disc herniation with spinal stenosis and lower extremity radiculopathy; pt is S/P surgery this afternoon.  Pharmacy has been consulted for vancomycin dosing for post-op surgical prophylaxis. Per surgeon note, pt does not have drain in place post op.  WBC 6.9, afebrile; Scr 0.57, CrCl 35.6 ml/min  Pt rec'd vancomycin 1 gm IV X 1 pre op at 1315 this afternoon  Plan: Vancomycin 750 mg IV X 1 ~12 hrs after pre-op dose (pt's wt is 49.9 kg)  Height: 5' (152.4 cm) Weight: 49.9 kg (110 lb) IBW/kg (Calculated) : 45.5  Temp (24hrs), Avg:97.7 F (36.5 C), Min:97.5 F (36.4 C), Max:98.1 F (36.7 C)  Recent Labs  Lab 03/31/21 1013  WBC 6.9  CREATININE 0.57    Estimated Creatinine Clearance: 35.6 mL/min (by C-G formula based on SCr of 0.57 mg/dL).    Allergies  Allergen Reactions   Oxycodone Other (See Comments)    " my children said I went crazy ! "   Tape Other (See Comments)    Pulls skin off of legs. Tissue paper skin on legs   Keflex [Cephalexin] Hives and Rash    Microbiology results: 7/6 MRSA PCR: negative 7/5 COVID:  negative  Thank you for allowing pharmacy to be a part of this patient's care.  Gillermina Hu, PharmD, BCPS, [facility] Clinical Pharmacist 03/31/2021 6:21 PM

## 2021-03-31 NOTE — Plan of Care (Signed)
  Problem: Activity: Goal: Ability to tolerate increased activity will improve Outcome: Progressing   

## 2021-04-01 ENCOUNTER — Encounter (HOSPITAL_COMMUNITY): Payer: Self-pay | Admitting: Neurological Surgery

## 2021-04-01 DIAGNOSIS — Z8673 Personal history of transient ischemic attack (TIA), and cerebral infarction without residual deficits: Secondary | ICD-10-CM | POA: Diagnosis not present

## 2021-04-01 DIAGNOSIS — Z85819 Personal history of malignant neoplasm of unspecified site of lip, oral cavity, and pharynx: Secondary | ICD-10-CM | POA: Diagnosis not present

## 2021-04-01 DIAGNOSIS — Z8616 Personal history of COVID-19: Secondary | ICD-10-CM | POA: Diagnosis not present

## 2021-04-01 DIAGNOSIS — I10 Essential (primary) hypertension: Secondary | ICD-10-CM | POA: Diagnosis not present

## 2021-04-01 DIAGNOSIS — M48061 Spinal stenosis, lumbar region without neurogenic claudication: Secondary | ICD-10-CM | POA: Diagnosis not present

## 2021-04-01 DIAGNOSIS — M5116 Intervertebral disc disorders with radiculopathy, lumbar region: Secondary | ICD-10-CM | POA: Diagnosis not present

## 2021-04-01 MED ORDER — METHOCARBAMOL 500 MG PO TABS: 500.0000 mg | ORAL_TABLET | Freq: Four times a day (QID) | ORAL | 0 refills | Status: AC

## 2021-04-01 MED ORDER — HYDROCODONE-ACETAMINOPHEN 5-325 MG PO TABS: 1.0000 | ORAL_TABLET | Freq: Four times a day (QID) | ORAL | 0 refills | Status: AC | PRN

## 2021-04-01 NOTE — TOC Initial Note (Signed)
Transition of Care [facility]) - Initial/Assessment Note    Patient Details  Name: [name] MRN: [id number] Date of Birth: 21-Feb-1931  [facility] CM/SW Contact:    Joanne Chars, LCSW Phone Number: 04/01/2021, 11:09 AM  Clinical Narrative:   CSW met with pt and daughter in room.  Pt very  hard of hearing, permission given to speak with daughter Tye [place] and information obtained from her.  Pt agreeable to [facility], choice document given to daughter, they have worked with [facility] recently and would like to resume.  Pt requesting specific therapist at [place]: Pilar Plate.  Pt is not vaccinated for covid.  PCP in place.  CSW spoke with Stacie at [facility], who accepts referral and is aware of request for specific therapist.                 Expected Discharge Plan: [place] Services Barriers to Discharge: No Barriers Identified   Patient Goals and CMS Choice   CMS Medicare.gov Compare Post Acute Care list provided to:: Patient Represenative (must comment) Choice offered to / list presented to : Adult Children  Expected Discharge Plan and Services Expected Discharge Plan: [place] In-house Referral: Clinical Social Work   Post Acute Care Choice: [place] arrangements for the past 2 months: [place] Expected Discharge Date: 04/01/21               DME Arranged: N/A (DME through [facility] staff)         HH Arranged: PT [place]: [place] Date [place]: 04/01/21 Time HH Agency Contacted: 1100 Representative spoke with at [place]: [place]  Prior Living Arrangements/Services Living arrangements for the past 2 months: [place] with:: Adult Children Patient language and need for interpreter reviewed:: Yes        Need for Family Participation in Patient Care: Yes (Comment) Care giver support system in place?: Yes (comment) Current home services: Other (comment)  (none) Criminal Activity/Legal Involvement Pertinent to Current Situation/Hospitalization: No - Comment as needed  Activities of Daily Living Home Assistive Devices/Equipment: Walker (specify type), Cane (specify quad or straight), Grab bars in shower, Bedside commode/3-in-1, Hearing aid, Blood pressure cuff, Dentures (specify type) (transfer chair) ADL Screening (condition at time of admission) Patient's cognitive ability adequate to safely complete daily activities?: Yes Is the patient deaf or have difficulty hearing?: Yes (HA-left ear) Does the patient have difficulty seeing, even when wearing glasses/contacts?: Yes Does the patient have difficulty concentrating, remembering, or making decisions?: Yes Patient able to express need for assistance with ADLs?: Yes Does the patient have difficulty dressing or bathing?: Yes Independently performs ADLs?: No Communication: Independent Dressing (OT): Needs assistance Is this a change from baseline?: Pre-admission baseline Grooming: Independent Feeding: Independent Bathing: Needs assistance Is this a change from baseline?: Pre-admission baseline Toileting: Independent In/Out Bed: Independent Walks in Home: Dependent Is this a change from baseline?: Pre-admission baseline Does the patient have difficulty walking or climbing stairs?: Yes Weakness of Legs: None Weakness of Arms/Hands: None  Permission Sought/Granted Permission sought to share information with : Family Supports Permission granted to share information with : Yes, Verbal Permission Granted  Share Information with NAME: daughter Tye [place]  Permission granted to share info w AGENCY: HH        Emotional Assessment Appearance:: Appears stated age Attitude/Demeanor/Rapport: Unable to Assess Affect (typically observed): Unable to Assess Orientation: : Oriented to Self, Oriented to Place, Oriented to  Time, Oriented to Situation Alcohol / Substance Use: Not Applicable Psych  Involvement: No (comment)  Admission diagnosis:  S/P lumbar laminectomy [Z98.890] Patient Active Problem List   Diagnosis Date Noted   S/P lumbar laminectomy 03/31/2021   HTN (hypertension) 10/20/2018   Syncope 10/20/2018   Epigastric abdominal pain of unknown etiology 10/20/2018   OA (osteoarthritis) of knee 10/02/2017   Preretinal fibrosis 09/01/2015   Preretinal fibrosis, left eye 08/06/2015   Aspiration pneumonia ([place]) 07/13/2012   Postop Altered mental state 07/12/2012   Postop Acute blood loss anemia 07/10/2012   Postop Hyponatremia 07/10/2012   Hypokalemia 07/10/2012   OA (osteoarthritis) of hip 07/09/2012   PCP:  Lawerance Cruel, MD Pharmacy:   [place], [place] - [address] [place] [place] [address] Phone: [phone] Fax: [phone]     Social Determinants of Health (SDOH) Interventions    Readmission Risk Interventions No flowsheet data found.

## 2021-04-01 NOTE — Evaluation (Signed)
Physical Therapy Evaluation Patient Details Name: [name] MRN: [id number] DOB: March 05, 1931 Today's Date: 04/01/2021   History of Present Illness  The pt is an 87 yo female presenting s/p L1-2 hemilaminectomy, fecetectomy, and foraminotomies on 7/6 due to ongoing L hip pain. PMH includes: arthritis, HLD, HTN, TIA x2, R THA, and L TKA.   Clinical Impression  Pt in bed upon arrival of PT, agreeable to evaluation at this time. Prior to admission the pt was limited to short distances of ambulation (3-5 ft) with assist from daughter, but was otherwise dependent on use of transport chair for mobility due to pain. The pt now presents with limitations in functional mobility, strength, endurance, posture, and stability due to above dx, and will continue to benefit from skilled PT to address these deficits. The pt was able to demo good adherence to cues, but continued to need minG and use of AD such as RW to complete hallway ambulation and sit-stand transfers. The pt and her daughter were educated on HEP of progressive ambulation, fall risk reduction, and use of DME at home. Will continue to benefit from HHPT to improve strength and independence with transfers at home, daughter in agreement with plan.      Follow Up Recommendations Home health PT;Supervision for mobility/OOB    Equipment Recommendations   (pt well-equipped)    Recommendations for Other Services       Precautions / Restrictions Precautions Precautions: Back;Fall Precaution Booklet Issued: Yes (comment) Precaution Comments: no brace needed Restrictions Weight Bearing Restrictions: No      Mobility  Bed Mobility Overal bed mobility: Needs Assistance Bed Mobility: Rolling;Sit to Sidelying Rolling: Min assist       Sit to sidelying: Min assist General bed mobility comments: minA to complete with maintaining spinal precautions    Transfers Overall transfer level: Needs assistance Equipment used: Rolling walker (2  wheeled) Transfers: Sit to/from Stand Sit to Stand: Min assist         General transfer comment: minA with verbal cues to power up to standing  Ambulation/Gait Ambulation/Gait assistance: Min guard Gait Distance (Feet): 125 Feet Assistive device: Rolling walker (2 wheeled) Gait Pattern/deviations: Step-through pattern;Decreased stride length;Trunk flexed Gait velocity: 0.33 m/s Gait velocity interpretation: <1.31 ft/sec, indicative of household ambulator General Gait Details: cues to elongate stride to step-through, pt with no overt LOB but needing cues for posture and positoining in RW  Stairs            Wheelchair Mobility    Modified Rankin (Stroke Patients Only)       Balance Overall balance assessment: Mild deficits observed, not formally tested                                           Pertinent Vitals/Pain Pain Assessment: Faces Faces Pain Scale: Hurts little more Pain Location: L hip/thigh Pain Descriptors / Indicators: Cramping;Discomfort;Grimacing;Sharp Pain Intervention(s): Limited activity within patient's tolerance;Monitored during session;Repositioned;Patient requesting pain meds-RN notified    Home Living Family/patient expects to be discharged to:: Private residence Living Arrangements: Children Available Help at Discharge: Family;Available 24 hours/day Type of Home: House Home Access: Stairs to enter Entrance Stairs-Rails: Left Entrance Stairs-Number of Steps: 2 Home Layout: One level Home Equipment: Walker - 2 wheels;Cane - single point;Bedside commode;Grab bars - tub/shower;Transport chair      Prior Function Level of Independence: Needs assistance   Gait /  Transfers Assistance Needed: mobilizing with assist of daughter and transport chair for any distance longer than 3-5 ft.  ADL's / Homemaking Assistance Needed: assist from daughter        Hand Dominance   Dominant Hand: Left    Extremity/Trunk Assessment    Upper Extremity Assessment Upper Extremity Assessment: Generalized weakness (R shoulder limited to ~45 deg flexion due to arthritis)    Lower Extremity Assessment Lower Extremity Assessment: Generalized weakness (pt denies difference in sensation)    Cervical / Trunk Assessment Cervical / Trunk Assessment: Kyphotic;Other exceptions Cervical / Trunk Exceptions: s/p spine surgery  Communication   Communication: HOH  Cognition Arousal/Alertness: Awake/alert Behavior During Therapy: WFL for tasks assessed/performed Overall Cognitive Status: Within Functional Limits for tasks assessed                                 General Comments: pt with some gaps in memory that daughter corrected (could be due to [facility]). pt very pleasant and accepting of cues/commands      General Comments General comments (skin integrity, edema, etc.): VSS, pt with increased pain in L thigh after mobility. daugher educated on progressive walking program and fall risk reduction at home    Exercises     Assessment/Plan    PT Assessment Patient needs continued PT services  PT Problem List Decreased strength;Decreased range of motion;Decreased activity tolerance;Decreased balance;Decreased mobility       PT Treatment Interventions      PT Goals (Current goals can be found in the Care Plan section)  Acute Rehab PT Goals Patient Stated Goal: return to walking in [employer] PT Goal Formulation: With patient Time For Goal Achievement: 04/15/21 Potential to Achieve Goals: Good    Frequency Min 5X/week   Barriers to discharge        Co-evaluation               AM-PAC PT "6 Clicks" Mobility  Outcome Measure Help needed turning from your back to your side while in a flat bed without using bedrails?: A Little Help needed moving from lying on your back to sitting on the side of a flat bed without using bedrails?: A Little Help needed moving to and from a bed to a chair (including a  wheelchair)?: A Little Help needed standing up from a chair using your arms (e.g., wheelchair or bedside chair)?: A Little Help needed to walk in hospital room?: A Little Help needed climbing 3-5 steps with a railing? : A Little 6 Click Score: 18    End of Session Equipment Utilized During Treatment: Gait belt Activity Tolerance: Patient limited by fatigue Patient left: in bed;with call bell/phone within reach;with family/visitor present Nurse Communication: Mobility status;Patient requests pain meds PT Visit Diagnosis: Unsteadiness on feet (R26.81);Muscle weakness (generalized) (M62.81);Other abnormalities of gait and mobility (R26.89)    Time: 8250-5397 PT Time Calculation (min) (ACUTE ONLY): 29 min   Charges:   PT Evaluation $PT Eval Low Complexity: 1 Low          Karma Ganja, PT, DPT   Acute Rehabilitation Department Pager #: [phone]  Otho Bellows 04/01/2021, 10:20 AM

## 2021-04-01 NOTE — Progress Notes (Signed)
Patient was transported via wheelchair by volunteer for discharge home; in no acute distress nor complaints of pain nor discomfort; all belongings checked and accounted for; discharge instructions given to patient's daughter and she verbalized understanding on the instructions given.

## 2021-04-01 NOTE — Discharge Summary (Signed)
Physician Discharge Summary  Patient ID: [name] MRN: [id number] DOB/AGE: [birth year] 87 y.o.  Admit date: 03/31/2021 Discharge date: 04/01/2021  Admission Diagnoses:  Large lumbar disc herniation L1-2 with spinal stenosis and left lower extremity radiculopathy    Discharge Diagnoses: same   Discharged Condition: good  Hospital Course: The patient was admitted on 03/31/2021 and taken to the operating room where the patient underwent left L1-2 microdiscectomy . The patient tolerated the procedure well and was taken to the recovery room and then to the floor in stable condition. The hospital course was routine. There were no complications. The wound remained clean dry and intact. Pt had appropriate back soreness. No complaints of leg pain or new N/T/W. The patient remained afebrile with stable vital signs, and tolerated a regular diet. The patient continued to increase activities, and pain was well controlled with oral pain medications.   Consults: None  Significant Diagnostic Studies:  Results for orders placed or performed during the hospital encounter of 03/31/21  Surgical pcr screen   Specimen: Nasal Mucosa; Nasal Swab  Result Value Ref Range   MRSA, PCR NEGATIVE NEGATIVE   Staphylococcus aureus NEGATIVE NEGATIVE  CBC WITH DIFFERENTIAL  Result Value Ref Range   WBC 6.9 4.0 - 10.5 K/uL   RBC 3.43 (L) 3.87 - 5.11 MIL/uL   Hemoglobin 10.0 (L) 12.0 - 15.0 g/dL   HCT 29.7 (L) 36.0 - 46.0 %   MCV 86.6 80.0 - 100.0 fL   MCH 29.2 26.0 - 34.0 pg   MCHC 33.7 30.0 - 36.0 g/dL   RDW 14.2 11.5 - 15.5 %   Platelets 423 (H) 150 - 400 K/uL   nRBC 0.0 0.0 - 0.2 %   Neutrophils Relative % 66 %   Neutro Abs 4.5 1.7 - 7.7 K/uL   Lymphocytes Relative 20 %   Lymphs Abs 1.4 0.7 - 4.0 K/uL   Monocytes Relative 10 %   Monocytes Absolute 0.7 0.1 - 1.0 K/uL   Eosinophils Relative 3 %   Eosinophils Absolute 0.2 0.0 - 0.5 K/uL   Basophils Relative 1 %   Basophils Absolute 0.1 0.0 - 0.1 K/uL    Immature Granulocytes 0 %   Abs Immature Granulocytes 0.02 0.00 - 0.07 K/uL  Basic metabolic panel  Result Value Ref Range   Sodium 127 (L) 135 - 145 mmol/L   Potassium 3.6 3.5 - 5.1 mmol/L   Chloride 93 (L) 98 - 111 mmol/L   CO2 25 22 - 32 mmol/L   Glucose, Bld 105 (H) 70 - 99 mg/dL   BUN 10 8 - 23 mg/dL   Creatinine, Ser 0.57 0.44 - 1.00 mg/dL   Calcium 9.2 8.9 - 10.3 mg/dL   GFR, Estimated >60 >60 mL/min   Anion gap 9 5 - 15  Protime-INR  Result Value Ref Range   Prothrombin Time 12.8 11.4 - 15.2 seconds   INR 1.0 0.8 - 1.2    DG Lumbar Spine 2-3 Views  Result Date: 03/31/2021 CLINICAL DATA:  Intraop localization EXAM: LUMBAR SPINE - 2-3 VIEW COMPARISON:  05/03/2020, MRI 01/19/2021 FINDINGS: Two lateral images of the lumbar spine obtained intraoperatively. Image labeled film 1 demonstrates linear localizing instrument overlying the inter spinous region at L2 level. Subsequent image labeled film 2 demonstrates surgical instruments and linear localizing instrument overlying the posterior elements at the level of L2. IMPRESSION: Limited lateral views of the lumbar spine obtained intraoperatively for localization purposes Electronically Signed   By: Madie Reno.D.  On: 03/31/2021 19:01    Antibiotics:  Anti-infectives (From admission, onward)    Start     Dose/Rate Route Frequency Ordered Stop   04/01/21 0100  vancomycin (VANCOREADY) IVPB 750 mg/150 mL        750 mg 150 mL/hr over 60 Minutes Intravenous  Once 03/31/21 1830 04/01/21 0210   03/31/21 1305  vancomycin (VANCOCIN) 1-5 GM/200ML-% IVPB       Note to Pharmacy: [place]   : cabinet override      03/31/21 1305 03/31/21 1315       Discharge Exam: Blood pressure 126/61, pulse 83, temperature 98.3 F (36.8 C), temperature source Oral, resp. rate 18, height 5' (1.524 m), weight 49.9 kg, SpO2 97 %. Neurologic: Grossly normal Ambulating and voiding well, incision cdi   Discharge Medications:   Allergies as  of 04/01/2021       Reactions   Oxycodone Other (See Comments)   " my children said I went crazy ! "   Tape Other (See Comments)   Pulls skin off of legs. Tissue paper skin on legs   Keflex [cephalexin] Hives, Rash        Medication List     TAKE these medications    acetaminophen 500 MG tablet Commonly known as: TYLENOL Take 1,000 mg by mouth every 6 (six) hours as needed for mild pain.   aspirin EC 81 MG tablet Take 81 mg by mouth daily. Swallow whole.   fluocinonide gel 0.05 % Commonly known as: LIDEX Apply 1 application topically at bedtime as needed (mouth sores). Orally   fluticasone 50 MCG/ACT nasal spray Commonly known as: FLONASE Place 1 spray into both nostrils at bedtime.   hydrochlorothiazide 25 MG tablet Commonly known as: HYDRODIURIL Take 25 mg by mouth daily.   HYDROcodone-acetaminophen 5-325 MG tablet Commonly known as: NORCO/VICODIN Take 1 tablet by mouth 4 (four) times daily as needed for severe pain.   ibuprofen 100 MG/5ML suspension Commonly known as: ADVIL Take 300 mg by mouth every 4 (four) hours as needed for moderate pain.   methocarbamol 500 MG tablet Commonly known as: Robaxin Take 1 tablet (500 mg total) by mouth 4 (four) times daily.   OVER THE COUNTER MEDICATION Apply 1 application topically daily as needed (pain). CBD Cream   polyethylene glycol 17 g packet Commonly known as: MIRALAX / GLYCOLAX Take 17 g by mouth daily.   silver sulfADIAZINE 1 % cream Commonly known as: SILVADENE Apply 1 application topically daily as needed (leg ulcers).   SYSTANE BALANCE OP Place 1 drop into both eyes 4 (four) times daily.   Vitamin C 500 MG/5ML Liqd Take 500 mg by mouth daily.   VITAMIN D PO Take 4,000 Units by mouth daily. Liquid form        Disposition: home   Final Dx: LEft L1-2 microdiscectomy   Discharge Instructions      Remove dressing in 72 hours   Complete by: As directed    Call MD for:  difficulty breathing,  headache or visual disturbances   Complete by: As directed    Call MD for:  hives   Complete by: As directed    Call MD for:  persistant nausea and vomiting   Complete by: As directed    Call MD for:  redness, tenderness, or signs of infection (pain, swelling, redness, odor or green/yellow discharge around incision site)   Complete by: As directed    Call MD for:  severe uncontrolled pain  Complete by: As directed    Call MD for:  temperature >100.4   Complete by: As directed    Diet - low sodium heart healthy   Complete by: As directed    Driving Restrictions   Complete by: As directed    No driving for 2 weeks, no riding in the car for 1 week   Increase activity slowly   Complete by: As directed    Lifting restrictions   Complete by: As directed    No lifting more than 8 lbs          Signed: Ocie Cornfield Venia Riveron 04/01/2021, 8:13 AM

## 2021-04-01 NOTE — Anesthesia Postprocedure Evaluation (Signed)
Anesthesia Post Note  Patient: [name]  Procedure(s) Performed: Microdiscectomy - Lumbar one-Lumbar two - left (Left: Spine Lumbar)     Patient location during evaluation: PACU Anesthesia Type: General Level of consciousness: awake and alert Pain management: pain level controlled Vital Signs Assessment: post-procedure vital signs reviewed and stable Respiratory status: spontaneous breathing, nonlabored ventilation, respiratory function stable and patient connected to nasal cannula oxygen Cardiovascular status: blood pressure returned to baseline and stable Postop Assessment: no apparent nausea or vomiting Anesthetic complications: no   No notable events documented.  Last Vitals:  Vitals:   04/01/21 0307 04/01/21 0703  BP: (!) 141/77 126/61  Pulse: 92 83  Resp: 18 18  Temp: 36.5 C 36.8 C  SpO2: 93% 97%    Last Pain:  Vitals:   04/01/21 1044  TempSrc:   PainSc: 2                  Tiajuana Amass

## 2021-04-22 DIAGNOSIS — I872 Venous insufficiency (chronic) (peripheral): Secondary | ICD-10-CM | POA: Diagnosis not present

## 2021-04-22 DIAGNOSIS — L0889 Other specified local infections of the skin and subcutaneous tissue: Secondary | ICD-10-CM | POA: Diagnosis not present

## 2021-04-22 DIAGNOSIS — I8311 Varicose veins of right lower extremity with inflammation: Secondary | ICD-10-CM | POA: Diagnosis not present

## 2021-04-22 DIAGNOSIS — Z85828 Personal history of other malignant neoplasm of skin: Secondary | ICD-10-CM | POA: Diagnosis not present

## 2021-04-22 DIAGNOSIS — L03116 Cellulitis of left lower limb: Secondary | ICD-10-CM | POA: Diagnosis not present

## 2021-04-22 DIAGNOSIS — I8312 Varicose veins of left lower extremity with inflammation: Secondary | ICD-10-CM | POA: Diagnosis not present

## 2021-04-28 DIAGNOSIS — Z20822 Contact with and (suspected) exposure to covid-19: Secondary | ICD-10-CM | POA: Diagnosis not present

## 2021-05-04 DIAGNOSIS — H04122 Dry eye syndrome of left lacrimal gland: Secondary | ICD-10-CM | POA: Diagnosis not present

## 2021-05-04 DIAGNOSIS — H524 Presbyopia: Secondary | ICD-10-CM | POA: Diagnosis not present

## 2021-05-04 DIAGNOSIS — H5213 Myopia, bilateral: Secondary | ICD-10-CM | POA: Diagnosis not present

## 2021-05-04 DIAGNOSIS — H16122 Filamentary keratitis, left eye: Secondary | ICD-10-CM | POA: Diagnosis not present

## 2021-05-04 DIAGNOSIS — H52223 Regular astigmatism, bilateral: Secondary | ICD-10-CM | POA: Diagnosis not present

## 2021-05-18 DIAGNOSIS — G2581 Restless legs syndrome: Secondary | ICD-10-CM | POA: Diagnosis not present

## 2021-05-18 DIAGNOSIS — I10 Essential (primary) hypertension: Secondary | ICD-10-CM | POA: Diagnosis not present

## 2021-05-18 DIAGNOSIS — Z8619 Personal history of other infectious and parasitic diseases: Secondary | ICD-10-CM | POA: Diagnosis not present

## 2021-05-18 DIAGNOSIS — M1990 Unspecified osteoarthritis, unspecified site: Secondary | ICD-10-CM | POA: Diagnosis not present

## 2021-05-27 DIAGNOSIS — Z974 Presence of external hearing-aid: Secondary | ICD-10-CM | POA: Diagnosis not present

## 2021-05-27 DIAGNOSIS — H938X3 Other specified disorders of ear, bilateral: Secondary | ICD-10-CM | POA: Diagnosis not present

## 2021-05-27 DIAGNOSIS — U099 Post covid-19 condition, unspecified: Secondary | ICD-10-CM | POA: Diagnosis not present

## 2021-05-27 DIAGNOSIS — R438 Other disturbances of smell and taste: Secondary | ICD-10-CM | POA: Diagnosis not present

## 2021-05-27 DIAGNOSIS — R0989 Other specified symptoms and signs involving the circulatory and respiratory systems: Secondary | ICD-10-CM | POA: Diagnosis not present

## 2021-06-01 DIAGNOSIS — H16122 Filamentary keratitis, left eye: Secondary | ICD-10-CM | POA: Diagnosis not present

## 2021-06-09 DIAGNOSIS — L0101 Non-bullous impetigo: Secondary | ICD-10-CM | POA: Diagnosis not present

## 2021-06-09 DIAGNOSIS — Z85828 Personal history of other malignant neoplasm of skin: Secondary | ICD-10-CM | POA: Diagnosis not present

## 2021-06-09 DIAGNOSIS — L0103 Bullous impetigo: Secondary | ICD-10-CM | POA: Diagnosis not present

## 2021-06-15 ENCOUNTER — Encounter: Payer: Self-pay | Admitting: Podiatry

## 2021-06-15 ENCOUNTER — Ambulatory Visit (INDEPENDENT_AMBULATORY_CARE_PROVIDER_SITE_OTHER): Payer: Medicare Other | Admitting: Podiatry

## 2021-06-15 ENCOUNTER — Other Ambulatory Visit: Payer: Self-pay

## 2021-06-15 DIAGNOSIS — B351 Tinea unguium: Secondary | ICD-10-CM | POA: Diagnosis not present

## 2021-06-15 DIAGNOSIS — M79675 Pain in left toe(s): Secondary | ICD-10-CM

## 2021-06-15 DIAGNOSIS — M79674 Pain in right toe(s): Secondary | ICD-10-CM | POA: Diagnosis not present

## 2021-06-15 NOTE — Progress Notes (Signed)
This patient presents  to the office for evaluation and treatment of long thick painful nails .  This patient is unable to trim her own nails since the patient cannot reach herfeet.  Patient says the nails are painful walking and wearing his shoes.  She presents  for preventive foot care services.  She presents with her daughter and has not been seen in over 4 years.  General Appearance  Alert, conversant and in no acute stress.  Vascular  Dorsalis pedis and posterior tibial  pulses are  weakly palpable  bilaterally.  Capillary return is within normal limits  bilaterally. Cold feet  bilaterally.  Absent digital hair  B/l.  Neurologic  Senn-Weinstein monofilament wire test within normal limits  bilaterally. Muscle power within normal limits bilaterally.  Nails Thick disfigured discolored nails with subungual debris  from 2-5 left and 1-5 right. No evidence of bacterial infection or drainage bilaterally.  Orthopedic  No limitations of motion  feet .  No crepitus or effusions noted.  No bony pathology or digital deformities noted.  Skin  normotropic skin with no porokeratosis noted bilaterally.  No signs of infections or ulcers noted.     Onychomycosis  Pain in toes right foot  Pain in toes left foot  IE  Debridement  of nails  1-5  B/L with a nail nipper.  Nails were then filed using a dremel tool with no incidents.    RTC 3 months    Gardiner Barefoot DPM

## 2021-09-15 DIAGNOSIS — R0981 Nasal congestion: Secondary | ICD-10-CM | POA: Diagnosis not present

## 2021-09-15 DIAGNOSIS — I10 Essential (primary) hypertension: Secondary | ICD-10-CM | POA: Diagnosis not present

## 2021-09-15 DIAGNOSIS — L97829 Non-pressure chronic ulcer of other part of left lower leg with unspecified severity: Secondary | ICD-10-CM | POA: Diagnosis not present

## 2021-09-15 DIAGNOSIS — L0100 Impetigo, unspecified: Secondary | ICD-10-CM | POA: Diagnosis not present

## 2021-09-28 DIAGNOSIS — E7800 Pure hypercholesterolemia, unspecified: Secondary | ICD-10-CM | POA: Diagnosis not present

## 2021-09-28 DIAGNOSIS — D649 Anemia, unspecified: Secondary | ICD-10-CM | POA: Diagnosis not present

## 2021-09-28 DIAGNOSIS — M1990 Unspecified osteoarthritis, unspecified site: Secondary | ICD-10-CM | POA: Diagnosis not present

## 2021-09-28 DIAGNOSIS — I10 Essential (primary) hypertension: Secondary | ICD-10-CM | POA: Diagnosis not present

## 2021-10-05 DIAGNOSIS — Z85828 Personal history of other malignant neoplasm of skin: Secondary | ICD-10-CM | POA: Diagnosis not present

## 2021-10-05 DIAGNOSIS — I83222 Varicose veins of left lower extremity with both ulcer of calf and inflammation: Secondary | ICD-10-CM | POA: Diagnosis not present

## 2021-10-05 DIAGNOSIS — L821 Other seborrheic keratosis: Secondary | ICD-10-CM | POA: Diagnosis not present

## 2021-10-06 DIAGNOSIS — M7061 Trochanteric bursitis, right hip: Secondary | ICD-10-CM | POA: Diagnosis not present

## 2021-10-06 DIAGNOSIS — Z96651 Presence of right artificial knee joint: Secondary | ICD-10-CM | POA: Diagnosis not present

## 2021-10-12 ENCOUNTER — Ambulatory Visit (INDEPENDENT_AMBULATORY_CARE_PROVIDER_SITE_OTHER): Payer: Medicare Other | Admitting: Podiatry

## 2021-10-12 ENCOUNTER — Other Ambulatory Visit: Payer: Self-pay

## 2021-10-12 ENCOUNTER — Encounter: Payer: Self-pay | Admitting: Podiatry

## 2021-10-12 DIAGNOSIS — M79674 Pain in right toe(s): Secondary | ICD-10-CM | POA: Diagnosis not present

## 2021-10-12 DIAGNOSIS — B351 Tinea unguium: Secondary | ICD-10-CM | POA: Diagnosis not present

## 2021-10-12 DIAGNOSIS — M79675 Pain in left toe(s): Secondary | ICD-10-CM

## 2021-10-12 NOTE — Progress Notes (Signed)
This patient presents  to the office for evaluation and treatment of long thick painful nails .  This patient is unable to trim her own nails since the patient cannot reach herfeet.  Patient says the nails are painful walking and wearing his shoes.  She presents  for preventive foot care services.  She presents with her daughter and has not been seen in over 4 years.  She is under treatment for venous stasis ulcers both legs.  General Appearance  Alert, conversant and in no acute stress.  Vascular  Dorsalis pedis and posterior tibial  pulses are  weakly palpable  bilaterally.  Capillary return is within normal limits  bilaterally. Cold feet  bilaterally.  Absent digital hair  B/l.  Neurologic  Senn-Weinstein monofilament wire test within normal limits  bilaterally. Muscle power within normal limits bilaterally.  Nails Thick disfigured discolored nails with subungual debris  from 2-5 left and 1-5 right. No evidence of bacterial infection or drainage bilaterally.  Orthopedic  No limitations of motion  feet .  No crepitus or effusions noted.  No bony pathology or digital deformities noted.  Skin  normotropic skin with no porokeratosis noted bilaterally.  No signs of infections or ulcers noted.     Onychomycosis  Pain in toes right foot  Pain in toes left foot  IE  Debridement  of nails  1-5  B/L with a nail nipper.  Nails were then filed using a dremel tool with no incidents.    RTC 4  months    Soma Lizak DPM   

## 2021-10-20 DIAGNOSIS — M7061 Trochanteric bursitis, right hip: Secondary | ICD-10-CM | POA: Diagnosis not present

## 2021-10-25 DIAGNOSIS — M7061 Trochanteric bursitis, right hip: Secondary | ICD-10-CM | POA: Diagnosis not present

## 2021-10-28 DIAGNOSIS — M7061 Trochanteric bursitis, right hip: Secondary | ICD-10-CM | POA: Diagnosis not present

## 2021-11-01 DIAGNOSIS — M7061 Trochanteric bursitis, right hip: Secondary | ICD-10-CM | POA: Diagnosis not present

## 2021-11-02 DIAGNOSIS — H0289 Other specified disorders of eyelid: Secondary | ICD-10-CM | POA: Diagnosis not present

## 2021-11-02 DIAGNOSIS — H16122 Filamentary keratitis, left eye: Secondary | ICD-10-CM | POA: Diagnosis not present

## 2021-11-02 DIAGNOSIS — H52223 Regular astigmatism, bilateral: Secondary | ICD-10-CM | POA: Diagnosis not present

## 2021-11-02 DIAGNOSIS — Z961 Presence of intraocular lens: Secondary | ICD-10-CM | POA: Diagnosis not present

## 2021-11-02 DIAGNOSIS — H524 Presbyopia: Secondary | ICD-10-CM | POA: Diagnosis not present

## 2021-11-02 DIAGNOSIS — H16142 Punctate keratitis, left eye: Secondary | ICD-10-CM | POA: Diagnosis not present

## 2021-11-02 DIAGNOSIS — H5213 Myopia, bilateral: Secondary | ICD-10-CM | POA: Diagnosis not present

## 2021-11-03 DIAGNOSIS — M7061 Trochanteric bursitis, right hip: Secondary | ICD-10-CM | POA: Diagnosis not present

## 2021-11-09 DIAGNOSIS — R1314 Dysphagia, pharyngoesophageal phase: Secondary | ICD-10-CM | POA: Diagnosis not present

## 2021-11-09 DIAGNOSIS — J3489 Other specified disorders of nose and nasal sinuses: Secondary | ICD-10-CM | POA: Diagnosis not present

## 2021-11-09 DIAGNOSIS — R0989 Other specified symptoms and signs involving the circulatory and respiratory systems: Secondary | ICD-10-CM | POA: Diagnosis not present

## 2021-11-10 DIAGNOSIS — M7061 Trochanteric bursitis, right hip: Secondary | ICD-10-CM | POA: Diagnosis not present

## 2021-11-11 ENCOUNTER — Other Ambulatory Visit: Payer: Self-pay | Admitting: Otolaryngology

## 2021-11-11 DIAGNOSIS — R1314 Dysphagia, pharyngoesophageal phase: Secondary | ICD-10-CM

## 2021-11-16 DIAGNOSIS — M7061 Trochanteric bursitis, right hip: Secondary | ICD-10-CM | POA: Diagnosis not present

## 2021-11-17 ENCOUNTER — Ambulatory Visit
Admission: RE | Admit: 2021-11-17 | Discharge: 2021-11-17 | Disposition: A | Discharge status: Home / Self Care | Payer: Medicare Other | Source: Ambulatory Visit | Attending: Otolaryngology | Admitting: Otolaryngology

## 2021-11-17 DIAGNOSIS — R1310 Dysphagia, unspecified: Secondary | ICD-10-CM | POA: Diagnosis not present

## 2021-11-17 DIAGNOSIS — K225 Diverticulum of esophagus, acquired: Secondary | ICD-10-CM | POA: Diagnosis not present

## 2021-11-17 DIAGNOSIS — R1314 Dysphagia, pharyngoesophageal phase: Secondary | ICD-10-CM

## 2021-11-17 DIAGNOSIS — K224 Dyskinesia of esophagus: Secondary | ICD-10-CM | POA: Diagnosis not present

## 2021-11-22 DIAGNOSIS — Z20822 Contact with and (suspected) exposure to covid-19: Secondary | ICD-10-CM | POA: Diagnosis not present

## 2021-12-03 DIAGNOSIS — R059 Cough, unspecified: Secondary | ICD-10-CM | POA: Diagnosis not present

## 2021-12-03 DIAGNOSIS — R051 Acute cough: Secondary | ICD-10-CM | POA: Diagnosis not present

## 2021-12-03 DIAGNOSIS — Z20822 Contact with and (suspected) exposure to covid-19: Secondary | ICD-10-CM | POA: Diagnosis not present

## 2021-12-05 DIAGNOSIS — Z20828 Contact with and (suspected) exposure to other viral communicable diseases: Secondary | ICD-10-CM | POA: Diagnosis not present

## 2021-12-14 DIAGNOSIS — Z20822 Contact with and (suspected) exposure to covid-19: Secondary | ICD-10-CM | POA: Diagnosis not present

## 2021-12-22 DIAGNOSIS — K225 Diverticulum of esophagus, acquired: Secondary | ICD-10-CM | POA: Diagnosis not present

## 2021-12-22 DIAGNOSIS — R0989 Other specified symptoms and signs involving the circulatory and respiratory systems: Secondary | ICD-10-CM | POA: Diagnosis not present

## 2021-12-23 DIAGNOSIS — R1310 Dysphagia, unspecified: Secondary | ICD-10-CM | POA: Diagnosis not present

## 2021-12-23 DIAGNOSIS — K225 Diverticulum of esophagus, acquired: Secondary | ICD-10-CM | POA: Diagnosis not present

## 2021-12-28 DIAGNOSIS — Z20822 Contact with and (suspected) exposure to covid-19: Secondary | ICD-10-CM | POA: Diagnosis not present

## 2021-12-30 DIAGNOSIS — Z20822 Contact with and (suspected) exposure to covid-19: Secondary | ICD-10-CM | POA: Diagnosis not present

## 2022-01-10 DIAGNOSIS — Z20822 Contact with and (suspected) exposure to covid-19: Secondary | ICD-10-CM | POA: Diagnosis not present

## 2022-01-11 DIAGNOSIS — D649 Anemia, unspecified: Secondary | ICD-10-CM | POA: Diagnosis not present

## 2022-01-11 DIAGNOSIS — I10 Essential (primary) hypertension: Secondary | ICD-10-CM | POA: Diagnosis not present

## 2022-01-13 DIAGNOSIS — H5213 Myopia, bilateral: Secondary | ICD-10-CM | POA: Diagnosis not present

## 2022-01-13 DIAGNOSIS — H02004 Unspecified entropion of left upper eyelid: Secondary | ICD-10-CM | POA: Diagnosis not present

## 2022-01-13 DIAGNOSIS — H353122 Nonexudative age-related macular degeneration, left eye, intermediate dry stage: Secondary | ICD-10-CM | POA: Diagnosis not present

## 2022-01-13 DIAGNOSIS — H52223 Regular astigmatism, bilateral: Secondary | ICD-10-CM | POA: Diagnosis not present

## 2022-01-13 DIAGNOSIS — H16122 Filamentary keratitis, left eye: Secondary | ICD-10-CM | POA: Diagnosis not present

## 2022-01-13 DIAGNOSIS — H04123 Dry eye syndrome of bilateral lacrimal glands: Secondary | ICD-10-CM | POA: Diagnosis not present

## 2022-01-13 DIAGNOSIS — H43393 Other vitreous opacities, bilateral: Secondary | ICD-10-CM | POA: Diagnosis not present

## 2022-01-13 DIAGNOSIS — H3530 Unspecified macular degeneration: Secondary | ICD-10-CM | POA: Diagnosis not present

## 2022-01-13 DIAGNOSIS — H524 Presbyopia: Secondary | ICD-10-CM | POA: Diagnosis not present

## 2022-01-13 DIAGNOSIS — H43813 Vitreous degeneration, bilateral: Secondary | ICD-10-CM | POA: Diagnosis not present

## 2022-01-13 DIAGNOSIS — H35712 Central serous chorioretinopathy, left eye: Secondary | ICD-10-CM | POA: Diagnosis not present

## 2022-01-13 DIAGNOSIS — H02834 Dermatochalasis of left upper eyelid: Secondary | ICD-10-CM | POA: Diagnosis not present

## 2022-01-17 DIAGNOSIS — Z0000 Encounter for general adult medical examination without abnormal findings: Secondary | ICD-10-CM | POA: Diagnosis not present

## 2022-01-17 DIAGNOSIS — I679 Cerebrovascular disease, unspecified: Secondary | ICD-10-CM | POA: Diagnosis not present

## 2022-01-17 DIAGNOSIS — L97929 Non-pressure chronic ulcer of unspecified part of left lower leg with unspecified severity: Secondary | ICD-10-CM | POA: Diagnosis not present

## 2022-01-17 DIAGNOSIS — D473 Essential (hemorrhagic) thrombocythemia: Secondary | ICD-10-CM | POA: Diagnosis not present

## 2022-01-17 DIAGNOSIS — M1990 Unspecified osteoarthritis, unspecified site: Secondary | ICD-10-CM | POA: Diagnosis not present

## 2022-01-17 DIAGNOSIS — I10 Essential (primary) hypertension: Secondary | ICD-10-CM | POA: Diagnosis not present

## 2022-01-17 DIAGNOSIS — G720 Drug-induced myopathy: Secondary | ICD-10-CM | POA: Diagnosis not present

## 2022-01-17 DIAGNOSIS — E7800 Pure hypercholesterolemia, unspecified: Secondary | ICD-10-CM | POA: Diagnosis not present

## 2022-01-17 DIAGNOSIS — H9190 Unspecified hearing loss, unspecified ear: Secondary | ICD-10-CM | POA: Diagnosis not present

## 2022-01-20 DIAGNOSIS — D485 Neoplasm of uncertain behavior of skin: Secondary | ICD-10-CM | POA: Diagnosis not present

## 2022-01-20 DIAGNOSIS — C44612 Basal cell carcinoma of skin of right upper limb, including shoulder: Secondary | ICD-10-CM | POA: Diagnosis not present

## 2022-01-20 DIAGNOSIS — Z85828 Personal history of other malignant neoplasm of skin: Secondary | ICD-10-CM | POA: Diagnosis not present

## 2022-01-31 DIAGNOSIS — Z20822 Contact with and (suspected) exposure to covid-19: Secondary | ICD-10-CM | POA: Diagnosis not present

## 2022-02-09 ENCOUNTER — Ambulatory Visit (INDEPENDENT_AMBULATORY_CARE_PROVIDER_SITE_OTHER): Payer: Medicare Other | Admitting: Podiatry

## 2022-02-09 ENCOUNTER — Encounter: Payer: Self-pay | Admitting: Podiatry

## 2022-02-09 DIAGNOSIS — B351 Tinea unguium: Secondary | ICD-10-CM | POA: Diagnosis not present

## 2022-02-09 DIAGNOSIS — M79674 Pain in right toe(s): Secondary | ICD-10-CM | POA: Diagnosis not present

## 2022-02-09 DIAGNOSIS — M79675 Pain in left toe(s): Secondary | ICD-10-CM | POA: Diagnosis not present

## 2022-02-09 NOTE — Progress Notes (Signed)
This patient presents  to the office for evaluation and treatment of long thick painful nails .  This patient is unable to trim her own nails since the patient cannot reach herfeet.  Patient says the nails are painful walking and wearing his shoes.  She presents  for preventive foot care services.  She presents with her daughter and has not been seen in over 4 years.  She is under treatment for venous stasis ulcers both legs.  General Appearance  Alert, conversant and in no acute stress.  Vascular  Dorsalis pedis and posterior tibial  pulses are  weakly palpable  bilaterally.  Capillary return is within normal limits  bilaterally. Cold feet  bilaterally.  Absent digital hair  B/l.  Neurologic  Senn-Weinstein monofilament wire test within normal limits  bilaterally. Muscle power within normal limits bilaterally.  Nails Thick disfigured discolored nails with subungual debris  from 2-5 left and 1-5 right. No evidence of bacterial infection or drainage bilaterally.  Orthopedic  No limitations of motion  feet .  No crepitus or effusions noted.  No bony pathology or digital deformities noted.  Skin  normotropic skin with no porokeratosis noted bilaterally.  No signs of infections or ulcers noted.     Onychomycosis  Pain in toes right foot  Pain in toes left foot  IE  Debridement  of nails  1-5  B/L with a nail nipper.  Nails were then filed using a dremel tool with no incidents.    RTC 4  months    Kyleah Pensabene DPM   

## 2022-03-22 DIAGNOSIS — M25512 Pain in left shoulder: Secondary | ICD-10-CM | POA: Diagnosis not present

## 2022-03-22 DIAGNOSIS — M19012 Primary osteoarthritis, left shoulder: Secondary | ICD-10-CM | POA: Diagnosis not present

## 2022-05-16 DIAGNOSIS — R059 Cough, unspecified: Secondary | ICD-10-CM | POA: Diagnosis not present

## 2022-05-16 DIAGNOSIS — L89152 Pressure ulcer of sacral region, stage 2: Secondary | ICD-10-CM | POA: Diagnosis not present

## 2022-05-16 DIAGNOSIS — I10 Essential (primary) hypertension: Secondary | ICD-10-CM | POA: Diagnosis not present

## 2022-05-16 DIAGNOSIS — M1990 Unspecified osteoarthritis, unspecified site: Secondary | ICD-10-CM | POA: Diagnosis not present

## 2022-05-26 DIAGNOSIS — H52223 Regular astigmatism, bilateral: Secondary | ICD-10-CM | POA: Diagnosis not present

## 2022-05-26 DIAGNOSIS — Z9849 Cataract extraction status, unspecified eye: Secondary | ICD-10-CM | POA: Diagnosis not present

## 2022-05-26 DIAGNOSIS — H5213 Myopia, bilateral: Secondary | ICD-10-CM | POA: Diagnosis not present

## 2022-05-26 DIAGNOSIS — H524 Presbyopia: Secondary | ICD-10-CM | POA: Diagnosis not present

## 2022-05-26 DIAGNOSIS — Z961 Presence of intraocular lens: Secondary | ICD-10-CM | POA: Diagnosis not present

## 2022-06-06 ENCOUNTER — Emergency Department (HOSPITAL_BASED_OUTPATIENT_CLINIC_OR_DEPARTMENT_OTHER): Payer: Medicare Other

## 2022-06-06 ENCOUNTER — Encounter (HOSPITAL_BASED_OUTPATIENT_CLINIC_OR_DEPARTMENT_OTHER): Payer: Self-pay | Admitting: Obstetrics and Gynecology

## 2022-06-06 ENCOUNTER — Emergency Department (HOSPITAL_BASED_OUTPATIENT_CLINIC_OR_DEPARTMENT_OTHER)
Admission: EM | Admit: 2022-06-06 | Discharge: 2022-06-06 | Disposition: A | Discharge status: Home / Self Care | Payer: Medicare Other | Attending: Emergency Medicine | Admitting: Emergency Medicine

## 2022-06-06 ENCOUNTER — Other Ambulatory Visit: Payer: Self-pay

## 2022-06-06 DIAGNOSIS — E785 Hyperlipidemia, unspecified: Secondary | ICD-10-CM | POA: Insufficient documentation

## 2022-06-06 DIAGNOSIS — Z79899 Other long term (current) drug therapy: Secondary | ICD-10-CM | POA: Insufficient documentation

## 2022-06-06 DIAGNOSIS — R519 Headache, unspecified: Secondary | ICD-10-CM | POA: Diagnosis not present

## 2022-06-06 DIAGNOSIS — I700 Atherosclerosis of aorta: Secondary | ICD-10-CM | POA: Diagnosis not present

## 2022-06-06 DIAGNOSIS — R4182 Altered mental status, unspecified: Secondary | ICD-10-CM | POA: Diagnosis not present

## 2022-06-06 DIAGNOSIS — R791 Abnormal coagulation profile: Secondary | ICD-10-CM | POA: Diagnosis not present

## 2022-06-06 DIAGNOSIS — Z7982 Long term (current) use of aspirin: Secondary | ICD-10-CM | POA: Diagnosis not present

## 2022-06-06 DIAGNOSIS — I10 Essential (primary) hypertension: Secondary | ICD-10-CM | POA: Insufficient documentation

## 2022-06-06 DIAGNOSIS — Z8673 Personal history of transient ischemic attack (TIA), and cerebral infarction without residual deficits: Secondary | ICD-10-CM | POA: Diagnosis not present

## 2022-06-06 DIAGNOSIS — Z7951 Long term (current) use of inhaled steroids: Secondary | ICD-10-CM | POA: Insufficient documentation

## 2022-06-06 LAB — CBC
HCT: 36.2 % (ref 36.0–46.0)
Hemoglobin: 12.9 g/dL (ref 12.0–15.0)
MCH: 31.6 pg (ref 26.0–34.0)
MCHC: 35.6 g/dL (ref 30.0–36.0)
MCV: 88.7 fL (ref 80.0–100.0)
Platelets: 366 10*3/uL (ref 150–400)
RBC: 4.08 MIL/uL (ref 3.87–5.11)
RDW: 14.1 % (ref 11.5–15.5)
WBC: 8.9 10*3/uL (ref 4.0–10.5)
nRBC: 0 % (ref 0.0–0.2)

## 2022-06-06 LAB — DIFFERENTIAL
Abs Immature Granulocytes: 0.02 10*3/uL (ref 0.00–0.07)
Basophils Absolute: 0.1 10*3/uL (ref 0.0–0.1)
Basophils Relative: 1 %
Eosinophils Absolute: 0.2 10*3/uL (ref 0.0–0.5)
Eosinophils Relative: 2 %
Immature Granulocytes: 0 %
Lymphocytes Relative: 23 %
Lymphs Abs: 2 10*3/uL (ref 0.7–4.0)
Monocytes Absolute: 0.9 10*3/uL (ref 0.1–1.0)
Monocytes Relative: 10 %
Neutro Abs: 5.7 10*3/uL (ref 1.7–7.7)
Neutrophils Relative %: 64 %

## 2022-06-06 LAB — PROTIME-INR
INR: 1 (ref 0.8–1.2)
Prothrombin Time: 12.8 seconds (ref 11.4–15.2)

## 2022-06-06 LAB — COMPREHENSIVE METABOLIC PANEL
ALT: 26 U/L (ref 0–44)
AST: 28 U/L (ref 15–41)
Albumin: 4.1 g/dL (ref 3.5–5.0)
Alkaline Phosphatase: 59 U/L (ref 38–126)
Anion gap: 10 (ref 5–15)
BUN: 23 mg/dL (ref 8–23)
CO2: 28 mmol/L (ref 22–32)
Calcium: 9.6 mg/dL (ref 8.9–10.3)
Chloride: 88 mmol/L — ABNORMAL LOW (ref 98–111)
Creatinine, Ser: 0.58 mg/dL (ref 0.44–1.00)
GFR, Estimated: >60 mL/min (ref >60)
Glucose, Bld: 100 mg/dL — ABNORMAL HIGH (ref 70–99)
Potassium: 3.1 mmol/L — ABNORMAL LOW (ref 3.5–5.1)
Sodium: 126 mmol/L — ABNORMAL LOW (ref 135–145)
Total Bilirubin: 0.4 mg/dL (ref 0.3–1.2)
Total Protein: 7.5 g/dL (ref 6.5–8.1)

## 2022-06-06 LAB — APTT: aPTT: 24 seconds (ref 24–36)

## 2022-06-06 LAB — CBG MONITORING, ED: Glucose-Capillary: 110 mg/dL — ABNORMAL HIGH (ref 70–99)

## 2022-06-06 LAB — MAGNESIUM: Magnesium: 1.8 mg/dL (ref 1.7–2.4)

## 2022-06-06 LAB — ETHANOL: Alcohol, Ethyl (B): <10 mg/dL (ref <10)

## 2022-06-06 MED ORDER — METOCLOPRAMIDE HCL 5 MG/5ML PO SOLN: 5.0000 mg | Freq: Three times a day (TID) | ORAL | 0 refills | Status: AC | PRN

## 2022-06-06 MED ORDER — LACTATED RINGERS IV BOLUS
500.0000 mL | Freq: Once | INTRAVENOUS | Status: AC
  Administered 2022-06-06: 500 mL via INTRAVENOUS

## 2022-06-06 MED ORDER — ACETAMINOPHEN 325 MG PO TABS
650.0000 mg | ORAL_TABLET | Freq: Once | ORAL | Status: DC
  Filled 2022-06-06: qty 2

## 2022-06-06 MED ORDER — METOCLOPRAMIDE HCL 5 MG/ML IJ SOLN
5.0000 mg | Freq: Once | INTRAMUSCULAR | Status: AC
  Administered 2022-06-06: 5 mg via INTRAVENOUS
  Filled 2022-06-06: qty 2

## 2022-06-06 MED ORDER — IOHEXOL 350 MG/ML SOLN
100.0000 mL | Freq: Once | INTRAVENOUS | Status: AC | PRN
  Administered 2022-06-06: 75 mL via INTRAVENOUS

## 2022-06-06 MED ORDER — ACETAMINOPHEN 160 MG/5ML PO SOLN
650.0000 mg | Freq: Once | ORAL | Status: DC
  Filled 2022-06-06: qty 20.3

## 2022-06-06 MED ORDER — SODIUM CHLORIDE 0.9% FLUSH
3.0000 mL | Freq: Once | INTRAVENOUS | Status: AC
  Administered 2022-06-06: 3 mL via INTRAVENOUS
  Filled 2022-06-06: qty 3

## 2022-06-06 MED ORDER — POTASSIUM CHLORIDE 20 MEQ PO PACK: 20.0000 meq | PACK | Freq: Two times a day (BID) | ORAL | 0 refills | Status: AC

## 2022-06-06 NOTE — ED Notes (Signed)
Pt verbalizes understanding of discharge instructions. Opportunity for questioning and answers were provided. Pt discharged from ED to home with daughter.    

## 2022-06-06 NOTE — ED Triage Notes (Signed)
Patient reports to the ER for decreased level of consciousness and the worst headache of her life. Patient's daughter, who lives with her, said she was not answering questions at home appropriately and said she was having severe pain that was radiating down her neck and making it hard to answer questions.   Last seen normal 9pm last night.

## 2022-06-06 NOTE — ED Notes (Signed)
MD notified of failed swallow screen due to patient being on thickened liquids at home. Daughter reports patient takes liquid tylenol at home. MD ordered liquid tylenol regardless of swallow screen.

## 2022-06-06 NOTE — ED Notes (Signed)
Patient transported to CT 

## 2022-06-06 NOTE — ED Notes (Signed)
Pt given small sip of tylenol, unable to swallow. Did not complete Tylenol administration. MD notified

## 2022-06-06 NOTE — ED Provider Notes (Signed)
[facility] EMERGENCY DEPT Provider Note   CSN: [id number] Arrival date & time: 06/06/22  1818     History {Add pertinent medical, surgical, social history, OB history to HPI:1} No chief complaint on file.   [name] is a 88 y.o. female.  HPI Patient presents for headache.  Medical history includes HTN, arthritis, HLD, TIA.  TIAs occurred over 10 years ago.  Patient currently is not on any antiplatelet or anticoagulation.  Patient has had a mild headache over the past several days.  Other than that, she was in her normal state of health last night.  She lives at home with her daughter.  Her daughter accompanies her in the ED and does provide most of the history.  This morning, patient was still in bed at 730, which is not typical for her.  She informed her daughter that she had a severe headache.  Worsening severity of headache occurred sometime during the night.  Patient takes ibuprofen every 6 hours at baseline for control of her arthritis.  She took her ibuprofen today.  She rested on the couch throughout the day.  She was able to eat a small amount of soup.  She did not have any vomiting episodes.  She did have persistent headache pain.  Patient's daughter also feels that she became more difficult to arouse.  She attributes this to the severity of her headache pain.  At baseline, patient is alert and oriented.  She ambulates with a rollator walker.    Home Medications Prior to Admission medications   Medication Sig Start Date End Date Taking? Authorizing Provider  acetaminophen (TYLENOL) 500 MG tablet Take 1,000 mg by mouth every 6 (six) hours as needed for mild pain.    [provider]  Ascorbic Acid (VITAMIN C) 500 MG/5ML LIQD Take 500 mg by mouth daily.    [provider]  aspirin EC 81 MG tablet Take 81 mg by mouth daily. Swallow whole.    [provider]  fluocinonide gel (LIDEX) 1.85 % Apply 1 application topically at bedtime as  needed (mouth sores). Orally    [provider]  fluticasone (FLONASE) 50 MCG/ACT nasal spray Place 1 spray into both nostrils at bedtime.    [provider]  hydrochlorothiazide (HYDRODIURIL) 25 MG tablet Take 25 mg by mouth daily.    [provider]  HYDROcodone-acetaminophen (NORCO/VICODIN) 5-325 MG tablet Take 1 tablet by mouth 4 (four) times daily as needed for severe pain. 04/01/21   Meyran, Ocie Cornfield, NP  ibuprofen (ADVIL) 100 MG/5ML suspension Take 300 mg by mouth every 4 (four) hours as needed for moderate pain.    [provider]  methocarbamol (ROBAXIN) 500 MG tablet Take 1 tablet (500 mg total) by mouth 4 (four) times daily. 04/01/21   Meyran, Ocie Cornfield, NP  OVER THE COUNTER MEDICATION Apply 1 application topically daily as needed (pain). CBD Cream    [provider]  polyethylene glycol (MIRALAX / GLYCOLAX) 17 g packet Take 17 g by mouth daily.    [provider]  Propylene Glycol (SYSTANE BALANCE OP) Place 1 drop into both eyes 4 (four) times daily.     [provider]  silver sulfADIAZINE (SILVADENE) 1 % cream Apply 1 application topically daily as needed (leg ulcers).    [provider]  VITAMIN D PO Take 4,000 Units by mouth daily. Liquid form    [provider]      Allergies    Oxycodone, Tape,  and Keflex [cephalexin]    Review of Systems   Review of Systems  Constitutional:  Positive for activity change and appetite change.  Neurological:  Positive for headaches.  All other systems reviewed and are negative.   Physical Exam Updated Vital Signs There were no vitals taken for this visit. Physical Exam Vitals and nursing note reviewed.  Constitutional:      General: She is not in acute distress.    Appearance: She is well-developed. She is ill-appearing. She is not toxic-appearing or diaphoretic.  HENT:     Head: Normocephalic and atraumatic.     Mouth/Throat:     Mouth: Mucous  membranes are moist.     Pharynx: Oropharynx is clear.  Eyes:     Extraocular Movements: Extraocular movements intact.     Conjunctiva/sclera: Conjunctivae normal.  Cardiovascular:     Rate and Rhythm: Normal rate and regular rhythm.     Heart sounds: No murmur heard. Pulmonary:     Effort: Pulmonary effort is normal. No respiratory distress.     Breath sounds: Normal breath sounds. No wheezing or rales.  Abdominal:     General: There is no distension.     Palpations: Abdomen is soft.     Tenderness: There is no abdominal tenderness.  Musculoskeletal:        General: No swelling.     Cervical back: Normal range of motion and neck supple.  Skin:    General: Skin is warm and dry.  Neurological:     Mental Status: She is alert and oriented to person, place, and time.     Cranial Nerves: No cranial nerve deficit, dysarthria or facial asymmetry.     Sensory: No sensory deficit.     Motor: No weakness.  Psychiatric:        Mood and Affect: Mood normal.        Behavior: Behavior normal.     ED Results / Procedures / Treatments   Labs (all labs ordered are listed, but only abnormal results are displayed) Labs Reviewed - No data to display  EKG None  Radiology No results found.  Procedures Procedures  {Document cardiac monitor, telemetry assessment procedure when appropriate:1}  Medications Ordered in ED Medications - No data to display  ED Course/ Medical Decision Making/ A&P                           Medical Decision Making Amount and/or Complexity of Data Reviewed Labs: ordered. Radiology: ordered.   ***  {Document critical care time when appropriate:1} {Document review of labs and clinical decision tools ie heart score, Chads2Vasc2 etc:1}  {Document your independent review of radiology images, and any outside records:1} {Document your discussion with family members, caretakers, and with consultants:1} {Document social determinants of health affecting pt's  care:1} {Document your decision making why or why not admission, treatments were needed:1} Final Clinical Impression(s) / ED Diagnoses Final diagnoses:  None    Rx / DC Orders ED Discharge Orders     None

## 2022-06-06 NOTE — Discharge Instructions (Addendum)
The following prescriptions were sent to your pharmacy: -Potassium supplement should be taken as prescribed for the next 2 days.  You should get follow-up lab work in 1 week to ensure normalized potassium. -Reglan can be taken as needed for recurrence of headache.  This can be taken in conjunction with ibuprofen and Tylenol.  Return to the emergency department at any time for any new or worsening symptoms of concern.

## 2022-06-09 DIAGNOSIS — J323 Chronic sphenoidal sinusitis: Secondary | ICD-10-CM | POA: Diagnosis not present

## 2022-06-09 DIAGNOSIS — R519 Headache, unspecified: Secondary | ICD-10-CM | POA: Diagnosis not present

## 2022-06-09 DIAGNOSIS — Z09 Encounter for follow-up examination after completed treatment for conditions other than malignant neoplasm: Secondary | ICD-10-CM | POA: Diagnosis not present

## 2022-06-09 DIAGNOSIS — E876 Hypokalemia: Secondary | ICD-10-CM | POA: Diagnosis not present

## 2022-06-09 DIAGNOSIS — Z6820 Body mass index (BMI) 20.0-20.9, adult: Secondary | ICD-10-CM | POA: Diagnosis not present

## 2022-06-15 ENCOUNTER — Ambulatory Visit (INDEPENDENT_AMBULATORY_CARE_PROVIDER_SITE_OTHER): Payer: Medicare Other | Admitting: Podiatry

## 2022-06-15 ENCOUNTER — Encounter: Payer: Self-pay | Admitting: Podiatry

## 2022-06-15 DIAGNOSIS — M79675 Pain in left toe(s): Secondary | ICD-10-CM | POA: Diagnosis not present

## 2022-06-15 DIAGNOSIS — M79674 Pain in right toe(s): Secondary | ICD-10-CM | POA: Diagnosis not present

## 2022-06-15 DIAGNOSIS — B351 Tinea unguium: Secondary | ICD-10-CM

## 2022-06-15 NOTE — Progress Notes (Signed)
This patient presents  to the office for evaluation and treatment of long thick painful nails .  This patient is unable to trim her own nails since the patient cannot reach herfeet.  Patient says the nails are painful walking and wearing his shoes.  She presents  for preventive foot care services.  She presents with her daughter and has not been seen in over 4 years.  She is under treatment for venous stasis ulcers both legs.  General Appearance  Alert, conversant and in no acute stress.  Vascular  Dorsalis pedis and posterior tibial  pulses are  weakly palpable  bilaterally.  Capillary return is within normal limits  bilaterally. Cold feet  bilaterally.  Absent digital hair  B/l.  Neurologic  Senn-Weinstein monofilament wire test within normal limits  bilaterally. Muscle power within normal limits bilaterally.  Nails Thick disfigured discolored nails with subungual debris  from 2-5 left and 1-5 right. No evidence of bacterial infection or drainage bilaterally.  Orthopedic  No limitations of motion  feet .  No crepitus or effusions noted.  No bony pathology or digital deformities noted.  Skin  normotropic skin with no porokeratosis noted bilaterally.  No signs of infections or ulcers noted.     Onychomycosis  Pain in toes right foot  Pain in toes left foot  IE  Debridement  of nails  1-5  B/L with a nail nipper.  Nails were then filed using a dremel tool with no incidents.    RTC 4  months    Gardiner Barefoot DPM

## 2022-06-21 DIAGNOSIS — M542 Cervicalgia: Secondary | ICD-10-CM | POA: Diagnosis not present

## 2022-07-01 DIAGNOSIS — M542 Cervicalgia: Secondary | ICD-10-CM | POA: Diagnosis not present

## 2022-07-04 DIAGNOSIS — M542 Cervicalgia: Secondary | ICD-10-CM | POA: Diagnosis not present

## 2022-07-08 DIAGNOSIS — M542 Cervicalgia: Secondary | ICD-10-CM | POA: Diagnosis not present

## 2022-07-11 DIAGNOSIS — M542 Cervicalgia: Secondary | ICD-10-CM | POA: Diagnosis not present

## 2022-07-14 DIAGNOSIS — M542 Cervicalgia: Secondary | ICD-10-CM | POA: Diagnosis not present

## 2022-07-18 DIAGNOSIS — M542 Cervicalgia: Secondary | ICD-10-CM | POA: Diagnosis not present

## 2022-07-21 DIAGNOSIS — M542 Cervicalgia: Secondary | ICD-10-CM | POA: Diagnosis not present

## 2022-07-25 DIAGNOSIS — M542 Cervicalgia: Secondary | ICD-10-CM | POA: Diagnosis not present

## 2022-07-28 DIAGNOSIS — M542 Cervicalgia: Secondary | ICD-10-CM | POA: Diagnosis not present

## 2022-08-03 DIAGNOSIS — M542 Cervicalgia: Secondary | ICD-10-CM | POA: Diagnosis not present

## 2022-08-09 ENCOUNTER — Ambulatory Visit (HOSPITAL_BASED_OUTPATIENT_CLINIC_OR_DEPARTMENT_OTHER)
Admission: RE | Admit: 2022-08-09 | Discharge: 2022-08-09 | Disposition: A | Discharge status: Home / Self Care | Payer: Medicare Other | Source: Ambulatory Visit | Attending: Family Medicine | Admitting: Family Medicine

## 2022-08-09 ENCOUNTER — Other Ambulatory Visit (HOSPITAL_COMMUNITY): Payer: Self-pay | Admitting: Family Medicine

## 2022-08-09 ENCOUNTER — Encounter (HOSPITAL_BASED_OUTPATIENT_CLINIC_OR_DEPARTMENT_OTHER): Payer: Self-pay | Admitting: Family Medicine

## 2022-08-09 DIAGNOSIS — S0993XA Unspecified injury of face, initial encounter: Secondary | ICD-10-CM | POA: Diagnosis not present

## 2022-08-09 DIAGNOSIS — S0990XA Unspecified injury of head, initial encounter: Secondary | ICD-10-CM

## 2022-08-09 DIAGNOSIS — R220 Localized swelling, mass and lump, head: Secondary | ICD-10-CM | POA: Diagnosis not present

## 2022-08-09 DIAGNOSIS — W19XXXA Unspecified fall, initial encounter: Secondary | ICD-10-CM | POA: Diagnosis not present

## 2022-08-16 DIAGNOSIS — W19XXXD Unspecified fall, subsequent encounter: Secondary | ICD-10-CM | POA: Diagnosis not present

## 2022-08-16 DIAGNOSIS — M542 Cervicalgia: Secondary | ICD-10-CM | POA: Diagnosis not present

## 2022-08-16 DIAGNOSIS — S0990XD Unspecified injury of head, subsequent encounter: Secondary | ICD-10-CM | POA: Diagnosis not present

## 2022-08-16 DIAGNOSIS — R2689 Other abnormalities of gait and mobility: Secondary | ICD-10-CM | POA: Diagnosis not present

## 2022-08-22 DIAGNOSIS — M542 Cervicalgia: Secondary | ICD-10-CM | POA: Diagnosis not present

## 2022-08-25 DIAGNOSIS — M542 Cervicalgia: Secondary | ICD-10-CM | POA: Diagnosis not present

## 2022-08-29 DIAGNOSIS — M542 Cervicalgia: Secondary | ICD-10-CM | POA: Diagnosis not present

## 2022-08-30 DIAGNOSIS — M25512 Pain in left shoulder: Secondary | ICD-10-CM | POA: Diagnosis not present

## 2022-08-30 DIAGNOSIS — M19012 Primary osteoarthritis, left shoulder: Secondary | ICD-10-CM | POA: Diagnosis not present

## 2022-09-01 DIAGNOSIS — M542 Cervicalgia: Secondary | ICD-10-CM | POA: Diagnosis not present

## 2022-09-05 DIAGNOSIS — M542 Cervicalgia: Secondary | ICD-10-CM | POA: Diagnosis not present

## 2022-09-08 DIAGNOSIS — M542 Cervicalgia: Secondary | ICD-10-CM | POA: Diagnosis not present

## 2022-09-12 DIAGNOSIS — Z09 Encounter for follow-up examination after completed treatment for conditions other than malignant neoplasm: Secondary | ICD-10-CM | POA: Diagnosis not present

## 2022-09-12 DIAGNOSIS — M542 Cervicalgia: Secondary | ICD-10-CM | POA: Diagnosis not present

## 2022-09-12 DIAGNOSIS — Z6820 Body mass index (BMI) 20.0-20.9, adult: Secondary | ICD-10-CM | POA: Diagnosis not present

## 2022-09-12 DIAGNOSIS — L89152 Pressure ulcer of sacral region, stage 2: Secondary | ICD-10-CM | POA: Diagnosis not present

## 2022-09-12 DIAGNOSIS — R0982 Postnasal drip: Secondary | ICD-10-CM | POA: Diagnosis not present

## 2022-09-12 DIAGNOSIS — R634 Abnormal weight loss: Secondary | ICD-10-CM | POA: Diagnosis not present

## 2022-09-16 DIAGNOSIS — M542 Cervicalgia: Secondary | ICD-10-CM | POA: Diagnosis not present

## 2022-09-20 DIAGNOSIS — R2689 Other abnormalities of gait and mobility: Secondary | ICD-10-CM | POA: Diagnosis not present

## 2022-09-23 DIAGNOSIS — M542 Cervicalgia: Secondary | ICD-10-CM | POA: Diagnosis not present

## 2022-09-28 DIAGNOSIS — R2689 Other abnormalities of gait and mobility: Secondary | ICD-10-CM | POA: Diagnosis not present

## 2022-09-29 DIAGNOSIS — H52223 Regular astigmatism, bilateral: Secondary | ICD-10-CM | POA: Diagnosis not present

## 2022-09-29 DIAGNOSIS — H5213 Myopia, bilateral: Secondary | ICD-10-CM | POA: Diagnosis not present

## 2022-09-29 DIAGNOSIS — S0500XA Injury of conjunctiva and corneal abrasion without foreign body, unspecified eye, initial encounter: Secondary | ICD-10-CM | POA: Diagnosis not present

## 2022-09-29 DIAGNOSIS — S0510XA Contusion of eyeball and orbital tissues, unspecified eye, initial encounter: Secondary | ICD-10-CM | POA: Diagnosis not present

## 2022-09-29 DIAGNOSIS — H524 Presbyopia: Secondary | ICD-10-CM | POA: Diagnosis not present

## 2022-09-30 DIAGNOSIS — M542 Cervicalgia: Secondary | ICD-10-CM | POA: Diagnosis not present

## 2022-10-03 DIAGNOSIS — R2689 Other abnormalities of gait and mobility: Secondary | ICD-10-CM | POA: Diagnosis not present

## 2022-10-05 DIAGNOSIS — M542 Cervicalgia: Secondary | ICD-10-CM | POA: Diagnosis not present

## 2022-10-13 DIAGNOSIS — M542 Cervicalgia: Secondary | ICD-10-CM | POA: Diagnosis not present

## 2022-10-17 DIAGNOSIS — R2689 Other abnormalities of gait and mobility: Secondary | ICD-10-CM | POA: Diagnosis not present

## 2022-10-19 ENCOUNTER — Ambulatory Visit: Payer: No Typology Code available for payment source | Admitting: Podiatry

## 2022-10-19 ENCOUNTER — Ambulatory Visit: Payer: Medicare Other | Admitting: Podiatry

## 2022-10-21 DIAGNOSIS — D649 Anemia, unspecified: Secondary | ICD-10-CM | POA: Diagnosis not present

## 2022-10-21 DIAGNOSIS — L02611 Cutaneous abscess of right foot: Secondary | ICD-10-CM | POA: Diagnosis not present

## 2022-10-21 DIAGNOSIS — E46 Unspecified protein-calorie malnutrition: Secondary | ICD-10-CM | POA: Diagnosis not present

## 2022-10-21 DIAGNOSIS — R059 Cough, unspecified: Secondary | ICD-10-CM | POA: Diagnosis not present

## 2022-10-21 DIAGNOSIS — R1310 Dysphagia, unspecified: Secondary | ICD-10-CM | POA: Diagnosis not present

## 2022-10-21 DIAGNOSIS — R5381 Other malaise: Secondary | ICD-10-CM | POA: Diagnosis not present

## 2022-10-26 DIAGNOSIS — W19XXXA Unspecified fall, initial encounter: Secondary | ICD-10-CM | POA: Diagnosis not present

## 2022-10-26 DIAGNOSIS — R55 Syncope and collapse: Secondary | ICD-10-CM | POA: Diagnosis not present

## 2022-10-26 DIAGNOSIS — R42 Dizziness and giddiness: Secondary | ICD-10-CM | POA: Diagnosis not present

## 2022-10-28 ENCOUNTER — Ambulatory Visit: Payer: Medicare Other | Attending: Family Medicine

## 2022-10-28 DIAGNOSIS — R1310 Dysphagia, unspecified: Secondary | ICD-10-CM | POA: Insufficient documentation

## 2022-10-30 ENCOUNTER — Other Ambulatory Visit: Payer: Self-pay

## 2022-10-30 NOTE — Therapy (Signed)
OUTPATIENT SPEECH LANGUAGE PATHOLOGY SWALLOW EVALUATION   Patient Name: [name] MRN: [id number] DOB:[birth year], 89 y.o., female Today's Date: 10/30/2022  PCP: Lawerance Cruel, MD   REFERRING PROVIDER: Same as PCP  END OF SESSION:  End of Session - 10/30/22 1659     Visit Number 1    Number of Visits 17    Date for SLP Re-Evaluation 12/27/22    SLP Start Time 1104    SLP Stop Time  1150    SLP Time Calculation (min) 46 min    Activity Tolerance Patient tolerated treatment well             Past Medical History:  Diagnosis Date   Anemia    Arthritis    "knees, hips, right foot, right hand" (09/01/2015)   COVID-19 09/2018   History of blood transfusion 2006   "w/knee OR   Hyperlipidemia    Hypertension    Oral thrush    Pneumonia    history of   PONV (postoperative nausea and vomiting)    "just w/knee OR in 2006"   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"   Stroke ([place])    TIA x 2   TIA (transient ischemic attack)    per ECHO 2008   Past Surgical History:  Procedure Laterality Date   25 GAUGE PARS PLANA VITRECTOMY WITH 20 GAUGE MVR PORT Left 09/01/2015   25 GAUGE PARS PLANA VITRECTOMY WITH 20 GAUGE MVR PORT Left 09/01/2015   Procedure: 25 GAUGE PARS PLANA VITRECTOMY WITH 20 GAUGE MVR PORT;  Surgeon: Hayden Pedro, MD;  Location: [facility];  Service: Ophthalmology;  Laterality: Left;   AIR/FLUID EXCHANGE Left 09/01/2015   Procedure: AIR/FLUID EXCHANGE;  Surgeon: Hayden Pedro, MD;  Location: [facility];  Service: Ophthalmology;  Laterality: Left;   APPENDECTOMY  1970   BREAST CYST EXCISION Bilateral 1960's   CATARACT EXTRACTION W/ INTRAOCULAR LENS  IMPLANT, BILATERAL  2005   EYE SURGERY     JOINT REPLACEMENT     LAPAROSCOPIC CHOLECYSTECTOMY  2000's   LASER PHOTO ABLATION Left 09/01/2015   Procedure: LASER PHOTO ABLATION;  Surgeon: Hayden Pedro, MD;  Location: [facility];  Service: Ophthalmology;  Laterality: Left;  Endo laser   LUMBAR  LAMINECTOMY/DECOMPRESSION MICRODISCECTOMY Left 03/31/2021   Procedure: Microdiscectomy - Lumbar one-Lumbar two - left;  Surgeon: Eustace Moore, MD;  Location: [facility];  Service: Neurosurgery;  Laterality: Left;   MEMBRANE PEEL Left 09/01/2015   Procedure: MEMBRANE PEEL;  Surgeon: Hayden Pedro, MD;  Location: [facility];  Service: Ophthalmology;  Laterality: Left;   MOHS SURGERY Left ~ 2010   lower lip   RETINAL DETACHMENT SURGERY Left 2011   TOTAL HIP ARTHROPLASTY  07/09/2012   Procedure: TOTAL HIP ARTHROPLASTY;  Surgeon: Gearlean Alf, MD;  Location: [facility];  Service: Orthopedics;  Laterality: Right;   TOTAL KNEE ARTHROPLASTY  2006   TOTAL KNEE ARTHROPLASTY Left 10/02/2017   Procedure: LEFT TOTAL KNEE ARTHROPLASTY;  Surgeon: Gaynelle Arabian, MD;  Location: [facility];  Service: Orthopedics;  Laterality: Left;  Adductor Block   TUBAL LIGATION     VAGINAL HYSTERECTOMY  1984   Patient Active Problem List   Diagnosis Date Noted   Pain due to onychomycosis of toenails of both feet 06/15/2021   S/P lumbar laminectomy 03/31/2021   HTN (hypertension) 10/20/2018   Syncope 10/20/2018   Epigastric abdominal pain of unknown etiology 10/20/2018   OA (osteoarthritis) of knee  10/02/2017   Preretinal fibrosis 09/01/2015   Preretinal fibrosis, left eye 08/06/2015   Aspiration pneumonia ([place]) 07/13/2012   Postop Altered mental state 07/12/2012   Postop Acute blood loss anemia 07/10/2012   Postop Hyponatremia 07/10/2012   Hypokalemia 07/10/2012   OA (osteoarthritis) of hip 07/09/2012    ONSET DATE: Script dated 10-24-22  REFERRING DIAG: R13.10 (ICD-10-CM) - Swallowing problem  THERAPY DIAG:  Dysphagia, unspecified type - Plan: SLP plan of care cert/re-cert, SLP modified barium swallow  Rationale for Evaluation and Treatment: Rehabilitation  SUBJECTIVE:   SUBJECTIVE STATEMENT: (Cathy - dtr) "Thngs haven't been the same since she had COVID in January 2022." Pt accompanied by: family member daughter  Tye [place]  PERTINENT HISTORY: Medical history includes HTN, arthritis, HLD, TIA.  TIAs occurred over 10 years ago.     PAIN:  Are you having pain? Yes: NPRS scale: 7/10 Pain location: "tailbone" Pain description: sharp pain Aggravating factors: sitting in certain positions Relieving factors: standing/position change  FLIVING ENVIRONMENT: Lives with: lives with their family Lives in: House/apartment  PLOF:  Level of assistance: Needed assistance with ADLs, Needed assistance with IADLS Employment: Retired  PATIENT GOALS: Pt would like to eat more solid food  OBJECTIVE:  08/09/2022  IMPRESSION: 1. No acute intracranial finding. Age related volume loss and chronic small-vessel ischemic changes of the white matter. 2. Left forehead superficial soft tissue swelling. No underlying skull fracture. 3. Chronic opacification of the right division of the sphenoid sinus.   COGNITION: Overall cognitive status: Within functional limits for tasks assessed  ORAL MOTOR EXAMINATION: Overall status: Impaired: Lingual: Bilateral (ROM, Strength, and Coordination) Comments: Labial WNL/WFL  CLINICAL SWALLOW ASSESSMENT:   Current diet: thin liquids and nectar thick liquids Dentition:  dentures Patient directly observed with POs: Yes: thin liquids and nectar thick liquids  Feeding: able to feed self Liquids provided by: teaspoon and cup Oral phase signs and symptoms:  none noted today Pharyngeal phase signs and symptoms: audible swallow, wet vocal quality, and delayed cough  Patient was seen for bedside swallowing assessment.  Until recently, she was eating solid food, but now is only able to drink boost ,smoothies ,soup, and water.  She has experienced 6 pounds weight loss in 6 weeks. SLP provided water for patient via cup sips.  She experienced wet voice with 3 out of 7 small sips, and audible swallowing including gulping noise with 2 of the 7 sips, and in 3 out of the 7 swallows.a squeaking  noise was heard possibly due to abnormal pressure differences in the swallowing tract.  Because Juliann Pulse stated that [place] is able to eat/drink spoonfuls of potato soup without notable difficulty, SLP had patient drink nectar thick water with a teaspoon.  No wet voice, nor squeaking noises, nor audible swallow was heard with teaspoons nectar thick liquid.  SLP then tried thin liquid with teaspoons, and the same result occurred.  SLP suggested patient have teaspoons liquid until objective swallowing assessment can be completed for maximum swallowing safety. Izora Gala and Juliann Pulse were educated about the basics of what a modified barium swallow exam will detect, and what types of modifications could be made during that examination.  They were told that [place] may return to this clinic for swallowing therapy if recommended by the SLP completing the modified barium swallow examination.   PATIENT REPORTED OUTCOME MEASURES (PROM): TBD if ST is initiated   TODAY'S TREATMENT:  DATE:  10/28/22 (eval): N/A  PATIENT EDUCATION: Education details: see body of note, above Person educated: Patient and Child(ren) Education method: Explanation and Verbal cues Education comprehension: verbalized understanding, returned demonstration, and verbal cues required   ASSESSMENT:  CLINICAL IMPRESSION: Patient is a 89y.o. female who was seen today for clinical swallowing assessment.  SLP skillfully observed patient with liquids, and suggested that liquid be taken via teaspoon for maximum safety at this time.  A modified barium swallow exam (MBSS) is strongly recommended to ID what type of diet and or liquids patient might be safest with, and to identify postural modifications to enhance pulmonary safety lastly, the feasibility of swallowing therapy will be better known with a modified barium swallow  evaluation  OBJECTIVE IMPAIRMENTS: include dysphagia. These impairments are limiting patient from safety when swallowing. Factors affecting potential to achieve goals and functional outcome are  none. . Patient will benefit from skilled SLP services to address above impairments and improve overall function.  REHAB POTENTIAL: Good   GOALS: Goals reviewed with patient? No  SHORT TERM GOALS: Target date: TBD following MBSS  Patient will follow aspiration precautions derived from modified barium swallow results with occasional min assist in 2 sessions Baseline: Goal status: INITIAL  2.  Patient will complete swallowing exercises with occasional min assist in 2 sessions Baseline:  Goal status: INITIAL  3.  Patient will provide 3 overt symptoms or signs of aspiration pneumonia in 2 sessions with modified independence Baseline:  Goal status: INITIAL   LONG TERM GOALS: Target date: To be determined  Patient will complete swallowing exercises with modified independence in 2 sessions Baseline:  Goal status: INITIAL  2.  Patient will follow aspiration precautions derived from modified barium swallow results with rare min assist in 2 sessions Baseline:  Goal status: INITIAL  3.  Patient will score higher or better on PROM than initial administration Baseline:  Goal status: INITIAL   PLAN:  SLP FREQUENCY: 1-2x/week if determined ST is necessary following MBSS  SLP DURATION: 8 weeks  PLANNED INTERVENTIONS: Aspiration precaution training, Pharyngeal strengthening exercises, Diet toleration management , Environmental controls, Trials of upgraded texture/liquids, SLP instruction and feedback, Compensatory strategies, and Patient/family education    [facility], [place] 10/30/2022, 4:59 PM

## 2022-10-31 ENCOUNTER — Telehealth (HOSPITAL_COMMUNITY): Payer: Self-pay

## 2022-10-31 ENCOUNTER — Other Ambulatory Visit (HOSPITAL_COMMUNITY): Payer: Self-pay

## 2022-10-31 DIAGNOSIS — R059 Cough, unspecified: Secondary | ICD-10-CM

## 2022-10-31 DIAGNOSIS — R1310 Dysphagia, unspecified: Secondary | ICD-10-CM

## 2022-11-02 DIAGNOSIS — R2689 Other abnormalities of gait and mobility: Secondary | ICD-10-CM | POA: Diagnosis not present

## 2022-11-03 ENCOUNTER — Ambulatory Visit (INDEPENDENT_AMBULATORY_CARE_PROVIDER_SITE_OTHER): Payer: Medicare Other | Admitting: Podiatry

## 2022-11-03 ENCOUNTER — Encounter: Payer: Self-pay | Admitting: Podiatry

## 2022-11-03 VITALS — BP 133/66 | HR 70

## 2022-11-03 DIAGNOSIS — B351 Tinea unguium: Secondary | ICD-10-CM | POA: Diagnosis not present

## 2022-11-03 DIAGNOSIS — M79675 Pain in left toe(s): Secondary | ICD-10-CM | POA: Diagnosis not present

## 2022-11-03 DIAGNOSIS — M79674 Pain in right toe(s): Secondary | ICD-10-CM

## 2022-11-03 NOTE — Progress Notes (Signed)
This patient presents  to the office for evaluation and treatment of long thick painful nails .  This patient is unable to trim her own nails since the patient cannot reach herfeet.  Patient says the nails are painful walking and wearing his shoes.  She presents  for preventive foot care services.  She presents with her daughter and has not been seen in over 4 years.  She is under treatment for venous stasis ulcers both legs.  General Appearance  Alert, conversant and in no acute stress.  Vascular  Dorsalis pedis and posterior tibial  pulses are  weakly palpable  bilaterally.  Capillary return is within normal limits  bilaterally. Cold feet  bilaterally.  Absent digital hair  B/l.  Neurologic  Senn-Weinstein monofilament wire test within normal limits  bilaterally. Muscle power within normal limits bilaterally.  Nails Thick disfigured discolored nails with subungual debris  from 2-5 left and 1-5 right. No evidence of bacterial infection or drainage bilaterally.  Orthopedic  No limitations of motion  feet .  No crepitus or effusions noted.  No bony pathology or digital deformities noted. Asymptomatic clavi second toe left foot.  Skin  normotropic skin with no porokeratosis noted bilaterally.  No signs of infections or ulcers noted.     Onychomycosis  Pain in toes right foot  Pain in toes left foot  Debridement  of nails  1-5  B/L with a nail nipper.  Nails were then filed using a dremel tool with no incidents. Patient wears compression socks per dermatologist.   RTC 4  months    Gardiner Barefoot DPM

## 2022-11-04 DIAGNOSIS — M542 Cervicalgia: Secondary | ICD-10-CM | POA: Diagnosis not present

## 2022-11-07 ENCOUNTER — Ambulatory Visit (HOSPITAL_COMMUNITY)
Admission: RE | Admit: 2022-11-07 | Discharge: 2022-11-07 | Disposition: A | Discharge status: Home / Self Care | Payer: Medicare Other | Source: Ambulatory Visit | Attending: Family Medicine | Admitting: Family Medicine

## 2022-11-07 DIAGNOSIS — R059 Cough, unspecified: Secondary | ICD-10-CM | POA: Diagnosis present

## 2022-11-07 DIAGNOSIS — R1314 Dysphagia, pharyngoesophageal phase: Secondary | ICD-10-CM | POA: Diagnosis not present

## 2022-11-07 DIAGNOSIS — K225 Diverticulum of esophagus, acquired: Secondary | ICD-10-CM | POA: Diagnosis not present

## 2022-11-07 DIAGNOSIS — R1310 Dysphagia, unspecified: Secondary | ICD-10-CM

## 2022-11-08 DIAGNOSIS — R2689 Other abnormalities of gait and mobility: Secondary | ICD-10-CM | POA: Diagnosis not present

## 2022-11-11 DIAGNOSIS — M542 Cervicalgia: Secondary | ICD-10-CM | POA: Diagnosis not present

## 2022-11-15 DIAGNOSIS — R2689 Other abnormalities of gait and mobility: Secondary | ICD-10-CM | POA: Diagnosis not present

## 2022-11-16 DIAGNOSIS — K225 Diverticulum of esophagus, acquired: Secondary | ICD-10-CM | POA: Diagnosis not present

## 2022-11-16 DIAGNOSIS — R1314 Dysphagia, pharyngoesophageal phase: Secondary | ICD-10-CM | POA: Diagnosis not present

## 2022-11-18 DIAGNOSIS — M542 Cervicalgia: Secondary | ICD-10-CM | POA: Diagnosis not present

## 2022-11-18 DIAGNOSIS — R2689 Other abnormalities of gait and mobility: Secondary | ICD-10-CM | POA: Diagnosis not present

## 2022-11-22 DIAGNOSIS — R2689 Other abnormalities of gait and mobility: Secondary | ICD-10-CM | POA: Diagnosis not present

## 2022-11-25 DIAGNOSIS — M542 Cervicalgia: Secondary | ICD-10-CM | POA: Diagnosis not present

## 2022-11-30 DIAGNOSIS — M542 Cervicalgia: Secondary | ICD-10-CM | POA: Diagnosis not present

## 2022-12-07 DIAGNOSIS — K225 Diverticulum of esophagus, acquired: Secondary | ICD-10-CM | POA: Diagnosis not present

## 2022-12-07 DIAGNOSIS — R1310 Dysphagia, unspecified: Secondary | ICD-10-CM | POA: Diagnosis not present

## 2022-12-07 DIAGNOSIS — Z681 Body mass index (BMI) 19 or less, adult: Secondary | ICD-10-CM | POA: Diagnosis not present

## 2022-12-07 DIAGNOSIS — E43 Unspecified severe protein-calorie malnutrition: Secondary | ICD-10-CM | POA: Diagnosis not present

## 2022-12-07 DIAGNOSIS — R058 Other specified cough: Secondary | ICD-10-CM | POA: Diagnosis not present

## 2022-12-09 DIAGNOSIS — M542 Cervicalgia: Secondary | ICD-10-CM | POA: Diagnosis not present

## 2022-12-14 DIAGNOSIS — M542 Cervicalgia: Secondary | ICD-10-CM | POA: Diagnosis not present

## 2022-12-26 DIAGNOSIS — M542 Cervicalgia: Secondary | ICD-10-CM | POA: Diagnosis not present

## 2023-01-03 DIAGNOSIS — M5030 Other cervical disc degeneration, unspecified cervical region: Secondary | ICD-10-CM | POA: Diagnosis not present

## 2023-01-16 DIAGNOSIS — E7800 Pure hypercholesterolemia, unspecified: Secondary | ICD-10-CM | POA: Diagnosis not present

## 2023-01-16 DIAGNOSIS — I10 Essential (primary) hypertension: Secondary | ICD-10-CM | POA: Diagnosis not present

## 2023-01-16 DIAGNOSIS — D473 Essential (hemorrhagic) thrombocythemia: Secondary | ICD-10-CM | POA: Diagnosis not present

## 2023-01-23 DIAGNOSIS — T17908A Unspecified foreign body in respiratory tract, part unspecified causing other injury, initial encounter: Secondary | ICD-10-CM | POA: Diagnosis not present

## 2023-01-23 DIAGNOSIS — I10 Essential (primary) hypertension: Secondary | ICD-10-CM | POA: Diagnosis not present

## 2023-01-23 DIAGNOSIS — G720 Drug-induced myopathy: Secondary | ICD-10-CM | POA: Diagnosis not present

## 2023-01-23 DIAGNOSIS — Z6820 Body mass index (BMI) 20.0-20.9, adult: Secondary | ICD-10-CM | POA: Diagnosis not present

## 2023-01-23 DIAGNOSIS — E7800 Pure hypercholesterolemia, unspecified: Secondary | ICD-10-CM | POA: Diagnosis not present

## 2023-01-23 DIAGNOSIS — I679 Cerebrovascular disease, unspecified: Secondary | ICD-10-CM | POA: Diagnosis not present

## 2023-01-23 DIAGNOSIS — Z0000 Encounter for general adult medical examination without abnormal findings: Secondary | ICD-10-CM | POA: Diagnosis not present

## 2023-01-25 DIAGNOSIS — M542 Cervicalgia: Secondary | ICD-10-CM | POA: Diagnosis not present

## 2023-01-31 DIAGNOSIS — M542 Cervicalgia: Secondary | ICD-10-CM | POA: Diagnosis not present

## 2023-02-02 DIAGNOSIS — M25512 Pain in left shoulder: Secondary | ICD-10-CM | POA: Diagnosis not present

## 2023-02-02 DIAGNOSIS — M5136 Other intervertebral disc degeneration, lumbar region: Secondary | ICD-10-CM | POA: Diagnosis not present

## 2023-02-02 DIAGNOSIS — M5030 Other cervical disc degeneration, unspecified cervical region: Secondary | ICD-10-CM | POA: Diagnosis not present

## 2023-02-03 DIAGNOSIS — M542 Cervicalgia: Secondary | ICD-10-CM | POA: Diagnosis not present

## 2023-02-04 DIAGNOSIS — M25512 Pain in left shoulder: Secondary | ICD-10-CM | POA: Diagnosis not present

## 2023-02-06 DIAGNOSIS — M542 Cervicalgia: Secondary | ICD-10-CM | POA: Diagnosis not present

## 2023-02-08 DIAGNOSIS — M542 Cervicalgia: Secondary | ICD-10-CM | POA: Diagnosis not present

## 2023-02-14 DIAGNOSIS — M542 Cervicalgia: Secondary | ICD-10-CM | POA: Diagnosis not present

## 2023-03-06 ENCOUNTER — Ambulatory Visit: Payer: Medicare Other | Admitting: Podiatry

## 2023-03-07 DIAGNOSIS — M19012 Primary osteoarthritis, left shoulder: Secondary | ICD-10-CM | POA: Diagnosis not present

## 2023-03-10 DIAGNOSIS — G2581 Restless legs syndrome: Secondary | ICD-10-CM | POA: Diagnosis not present

## 2023-03-10 DIAGNOSIS — M25512 Pain in left shoulder: Secondary | ICD-10-CM | POA: Diagnosis not present

## 2023-03-10 DIAGNOSIS — Z515 Encounter for palliative care: Secondary | ICD-10-CM | POA: Diagnosis not present

## 2023-03-10 DIAGNOSIS — I10 Essential (primary) hypertension: Secondary | ICD-10-CM | POA: Diagnosis not present

## 2023-03-10 DIAGNOSIS — E785 Hyperlipidemia, unspecified: Secondary | ICD-10-CM | POA: Diagnosis not present

## 2023-03-10 DIAGNOSIS — Z66 Do not resuscitate: Secondary | ICD-10-CM | POA: Diagnosis not present

## 2023-03-10 DIAGNOSIS — R1310 Dysphagia, unspecified: Secondary | ICD-10-CM | POA: Diagnosis not present

## 2023-03-10 DIAGNOSIS — E46 Unspecified protein-calorie malnutrition: Secondary | ICD-10-CM | POA: Diagnosis not present

## 2023-03-10 DIAGNOSIS — I679 Cerebrovascular disease, unspecified: Secondary | ICD-10-CM | POA: Diagnosis not present

## 2023-03-10 DIAGNOSIS — R32 Unspecified urinary incontinence: Secondary | ICD-10-CM | POA: Diagnosis not present

## 2023-03-10 DIAGNOSIS — I872 Venous insufficiency (chronic) (peripheral): Secondary | ICD-10-CM | POA: Diagnosis not present

## 2023-03-10 DIAGNOSIS — K225 Diverticulum of esophagus, acquired: Secondary | ICD-10-CM | POA: Diagnosis not present

## 2023-03-11 DIAGNOSIS — M25512 Pain in left shoulder: Secondary | ICD-10-CM | POA: Diagnosis not present

## 2023-03-11 DIAGNOSIS — E46 Unspecified protein-calorie malnutrition: Secondary | ICD-10-CM | POA: Diagnosis not present

## 2023-03-11 DIAGNOSIS — K225 Diverticulum of esophagus, acquired: Secondary | ICD-10-CM | POA: Diagnosis not present

## 2023-03-11 DIAGNOSIS — E785 Hyperlipidemia, unspecified: Secondary | ICD-10-CM | POA: Diagnosis not present

## 2023-03-11 DIAGNOSIS — R1310 Dysphagia, unspecified: Secondary | ICD-10-CM | POA: Diagnosis not present

## 2023-03-11 DIAGNOSIS — I10 Essential (primary) hypertension: Secondary | ICD-10-CM | POA: Diagnosis not present

## 2023-03-12 DIAGNOSIS — K225 Diverticulum of esophagus, acquired: Secondary | ICD-10-CM | POA: Diagnosis not present

## 2023-03-12 DIAGNOSIS — E46 Unspecified protein-calorie malnutrition: Secondary | ICD-10-CM | POA: Diagnosis not present

## 2023-03-12 DIAGNOSIS — E785 Hyperlipidemia, unspecified: Secondary | ICD-10-CM | POA: Diagnosis not present

## 2023-03-12 DIAGNOSIS — R1310 Dysphagia, unspecified: Secondary | ICD-10-CM | POA: Diagnosis not present

## 2023-03-12 DIAGNOSIS — M25512 Pain in left shoulder: Secondary | ICD-10-CM | POA: Diagnosis not present

## 2023-03-12 DIAGNOSIS — I10 Essential (primary) hypertension: Secondary | ICD-10-CM | POA: Diagnosis not present

## 2023-03-13 DIAGNOSIS — K225 Diverticulum of esophagus, acquired: Secondary | ICD-10-CM | POA: Diagnosis not present

## 2023-03-13 DIAGNOSIS — E46 Unspecified protein-calorie malnutrition: Secondary | ICD-10-CM | POA: Diagnosis not present

## 2023-03-13 DIAGNOSIS — I10 Essential (primary) hypertension: Secondary | ICD-10-CM | POA: Diagnosis not present

## 2023-03-13 DIAGNOSIS — M25512 Pain in left shoulder: Secondary | ICD-10-CM | POA: Diagnosis not present

## 2023-03-13 DIAGNOSIS — R1310 Dysphagia, unspecified: Secondary | ICD-10-CM | POA: Diagnosis not present

## 2023-03-13 DIAGNOSIS — E785 Hyperlipidemia, unspecified: Secondary | ICD-10-CM | POA: Diagnosis not present

## 2023-03-14 DIAGNOSIS — M25512 Pain in left shoulder: Secondary | ICD-10-CM | POA: Diagnosis not present

## 2023-03-14 DIAGNOSIS — E785 Hyperlipidemia, unspecified: Secondary | ICD-10-CM | POA: Diagnosis not present

## 2023-03-14 DIAGNOSIS — R1310 Dysphagia, unspecified: Secondary | ICD-10-CM | POA: Diagnosis not present

## 2023-03-14 DIAGNOSIS — E46 Unspecified protein-calorie malnutrition: Secondary | ICD-10-CM | POA: Diagnosis not present

## 2023-03-14 DIAGNOSIS — I10 Essential (primary) hypertension: Secondary | ICD-10-CM | POA: Diagnosis not present

## 2023-03-14 DIAGNOSIS — K225 Diverticulum of esophagus, acquired: Secondary | ICD-10-CM | POA: Diagnosis not present

## 2023-03-27 DEATH — deceased

## 2023-04-05 ENCOUNTER — Ambulatory Visit: Payer: Medicare Other | Admitting: Podiatry
